# Patient Record
Sex: Male | Born: 1945 | Race: White | Marital: Married | Smoking: Never smoker
Health system: Southern US, Community
[De-identification: ages and names within clinical notes are randomized; demographics above are authoritative.]

## PROBLEM LIST (undated history)

## (undated) DIAGNOSIS — T7840XA Allergy, unspecified, initial encounter: Secondary | ICD-10-CM

## (undated) DIAGNOSIS — Z9989 Dependence on other enabling machines and devices: Secondary | ICD-10-CM

## (undated) DIAGNOSIS — G43909 Migraine, unspecified, not intractable, without status migrainosus: Secondary | ICD-10-CM

## (undated) DIAGNOSIS — J45909 Unspecified asthma, uncomplicated: Secondary | ICD-10-CM

## (undated) DIAGNOSIS — I10 Essential (primary) hypertension: Secondary | ICD-10-CM

## (undated) DIAGNOSIS — J439 Emphysema, unspecified: Secondary | ICD-10-CM

## (undated) DIAGNOSIS — H409 Unspecified glaucoma: Secondary | ICD-10-CM

## (undated) DIAGNOSIS — K5900 Constipation, unspecified: Secondary | ICD-10-CM

## (undated) DIAGNOSIS — M7989 Other specified soft tissue disorders: Secondary | ICD-10-CM

## (undated) DIAGNOSIS — H9190 Unspecified hearing loss, unspecified ear: Secondary | ICD-10-CM

## (undated) DIAGNOSIS — C44309 Unspecified malignant neoplasm of skin of other parts of face: Secondary | ICD-10-CM

## (undated) DIAGNOSIS — J449 Chronic obstructive pulmonary disease, unspecified: Secondary | ICD-10-CM

## (undated) DIAGNOSIS — E669 Obesity, unspecified: Secondary | ICD-10-CM

## (undated) DIAGNOSIS — G4733 Obstructive sleep apnea (adult) (pediatric): Secondary | ICD-10-CM

## (undated) DIAGNOSIS — K219 Gastro-esophageal reflux disease without esophagitis: Secondary | ICD-10-CM

## (undated) HISTORY — DX: Emphysema, unspecified: J43.9

## (undated) HISTORY — DX: Essential (primary) hypertension: I10

## (undated) HISTORY — DX: Migraine, unspecified, not intractable, without status migrainosus: G43.909

## (undated) HISTORY — DX: Unspecified glaucoma: H40.9

## (undated) HISTORY — DX: Gastro-esophageal reflux disease without esophagitis: K21.9

## (undated) HISTORY — PX: CATARACT EXTRACTION: SUR2

## (undated) HISTORY — DX: Obstructive sleep apnea (adult) (pediatric): G47.33

## (undated) HISTORY — PX: MOHS SURGERY: SUR867

## (undated) HISTORY — PX: WISDOM TOOTH EXTRACTION: SHX21

## (undated) HISTORY — DX: Obstructive sleep apnea (adult) (pediatric): Z99.89

## (undated) HISTORY — DX: Constipation, unspecified: K59.00

## (undated) HISTORY — DX: Unspecified asthma, uncomplicated: J45.909

## (undated) HISTORY — DX: Other specified soft tissue disorders: M79.89

## (undated) HISTORY — DX: Allergy, unspecified, initial encounter: T78.40XA

## (undated) HISTORY — DX: Unspecified hearing loss, unspecified ear: H91.90

## (undated) HISTORY — DX: Obesity, unspecified: E66.9

## (undated) HISTORY — DX: Chronic obstructive pulmonary disease, unspecified: J44.9

## (undated) HISTORY — DX: Unspecified malignant neoplasm of skin of other parts of face: C44.309

---

## 2015-03-11 DIAGNOSIS — J3081 Allergic rhinitis due to animal (cat) (dog) hair and dander: Secondary | ICD-10-CM | POA: Diagnosis not present

## 2015-03-11 DIAGNOSIS — J3089 Other allergic rhinitis: Secondary | ICD-10-CM | POA: Diagnosis not present

## 2015-03-11 DIAGNOSIS — J301 Allergic rhinitis due to pollen: Secondary | ICD-10-CM | POA: Diagnosis not present

## 2015-03-20 DIAGNOSIS — J3081 Allergic rhinitis due to animal (cat) (dog) hair and dander: Secondary | ICD-10-CM | POA: Diagnosis not present

## 2015-03-20 DIAGNOSIS — J301 Allergic rhinitis due to pollen: Secondary | ICD-10-CM | POA: Diagnosis not present

## 2015-03-20 DIAGNOSIS — J3089 Other allergic rhinitis: Secondary | ICD-10-CM | POA: Diagnosis not present

## 2015-03-25 DIAGNOSIS — Z0000 Encounter for general adult medical examination without abnormal findings: Secondary | ICD-10-CM | POA: Diagnosis not present

## 2015-03-28 DIAGNOSIS — J301 Allergic rhinitis due to pollen: Secondary | ICD-10-CM | POA: Diagnosis not present

## 2015-03-28 DIAGNOSIS — J3081 Allergic rhinitis due to animal (cat) (dog) hair and dander: Secondary | ICD-10-CM | POA: Diagnosis not present

## 2015-03-28 DIAGNOSIS — J3089 Other allergic rhinitis: Secondary | ICD-10-CM | POA: Diagnosis not present

## 2015-03-31 DIAGNOSIS — J3081 Allergic rhinitis due to animal (cat) (dog) hair and dander: Secondary | ICD-10-CM | POA: Diagnosis not present

## 2015-03-31 DIAGNOSIS — J301 Allergic rhinitis due to pollen: Secondary | ICD-10-CM | POA: Diagnosis not present

## 2015-04-02 DIAGNOSIS — I10 Essential (primary) hypertension: Secondary | ICD-10-CM | POA: Diagnosis not present

## 2015-04-02 DIAGNOSIS — R69 Illness, unspecified: Secondary | ICD-10-CM | POA: Diagnosis not present

## 2015-04-02 DIAGNOSIS — H9190 Unspecified hearing loss, unspecified ear: Secondary | ICD-10-CM | POA: Diagnosis not present

## 2015-04-02 DIAGNOSIS — R7309 Other abnormal glucose: Secondary | ICD-10-CM | POA: Diagnosis not present

## 2015-04-02 DIAGNOSIS — G43909 Migraine, unspecified, not intractable, without status migrainosus: Secondary | ICD-10-CM | POA: Diagnosis not present

## 2015-04-02 DIAGNOSIS — Z6837 Body mass index (BMI) 37.0-37.9, adult: Secondary | ICD-10-CM | POA: Diagnosis not present

## 2015-04-02 DIAGNOSIS — R740 Nonspecific elevation of levels of transaminase and lactic acid dehydrogenase [LDH]: Secondary | ICD-10-CM | POA: Diagnosis not present

## 2015-04-02 DIAGNOSIS — Z0000 Encounter for general adult medical examination without abnormal findings: Secondary | ICD-10-CM | POA: Diagnosis not present

## 2015-04-02 DIAGNOSIS — H409 Unspecified glaucoma: Secondary | ICD-10-CM | POA: Diagnosis not present

## 2015-04-02 DIAGNOSIS — M545 Low back pain: Secondary | ICD-10-CM | POA: Diagnosis not present

## 2015-04-03 DIAGNOSIS — L570 Actinic keratosis: Secondary | ICD-10-CM | POA: Diagnosis not present

## 2015-04-03 DIAGNOSIS — J301 Allergic rhinitis due to pollen: Secondary | ICD-10-CM | POA: Diagnosis not present

## 2015-04-03 DIAGNOSIS — D2371 Other benign neoplasm of skin of right lower limb, including hip: Secondary | ICD-10-CM | POA: Diagnosis not present

## 2015-04-03 DIAGNOSIS — L718 Other rosacea: Secondary | ICD-10-CM | POA: Diagnosis not present

## 2015-04-03 DIAGNOSIS — J3089 Other allergic rhinitis: Secondary | ICD-10-CM | POA: Diagnosis not present

## 2015-04-03 DIAGNOSIS — L821 Other seborrheic keratosis: Secondary | ICD-10-CM | POA: Diagnosis not present

## 2015-04-03 DIAGNOSIS — J3081 Allergic rhinitis due to animal (cat) (dog) hair and dander: Secondary | ICD-10-CM | POA: Diagnosis not present

## 2015-04-04 DIAGNOSIS — J3089 Other allergic rhinitis: Secondary | ICD-10-CM | POA: Diagnosis not present

## 2015-04-11 DIAGNOSIS — J301 Allergic rhinitis due to pollen: Secondary | ICD-10-CM | POA: Diagnosis not present

## 2015-04-11 DIAGNOSIS — J3089 Other allergic rhinitis: Secondary | ICD-10-CM | POA: Diagnosis not present

## 2015-04-16 DIAGNOSIS — J301 Allergic rhinitis due to pollen: Secondary | ICD-10-CM | POA: Diagnosis not present

## 2015-04-16 DIAGNOSIS — J3081 Allergic rhinitis due to animal (cat) (dog) hair and dander: Secondary | ICD-10-CM | POA: Diagnosis not present

## 2015-04-16 DIAGNOSIS — J3089 Other allergic rhinitis: Secondary | ICD-10-CM | POA: Diagnosis not present

## 2015-04-24 DIAGNOSIS — J3089 Other allergic rhinitis: Secondary | ICD-10-CM | POA: Diagnosis not present

## 2015-04-24 DIAGNOSIS — J3081 Allergic rhinitis due to animal (cat) (dog) hair and dander: Secondary | ICD-10-CM | POA: Diagnosis not present

## 2015-04-24 DIAGNOSIS — J301 Allergic rhinitis due to pollen: Secondary | ICD-10-CM | POA: Diagnosis not present

## 2015-05-01 DIAGNOSIS — J3089 Other allergic rhinitis: Secondary | ICD-10-CM | POA: Diagnosis not present

## 2015-05-01 DIAGNOSIS — J301 Allergic rhinitis due to pollen: Secondary | ICD-10-CM | POA: Diagnosis not present

## 2015-05-01 DIAGNOSIS — J3081 Allergic rhinitis due to animal (cat) (dog) hair and dander: Secondary | ICD-10-CM | POA: Diagnosis not present

## 2015-05-06 DIAGNOSIS — J3089 Other allergic rhinitis: Secondary | ICD-10-CM | POA: Diagnosis not present

## 2015-05-06 DIAGNOSIS — J301 Allergic rhinitis due to pollen: Secondary | ICD-10-CM | POA: Diagnosis not present

## 2015-05-06 DIAGNOSIS — J3081 Allergic rhinitis due to animal (cat) (dog) hair and dander: Secondary | ICD-10-CM | POA: Diagnosis not present

## 2015-05-09 DIAGNOSIS — J3089 Other allergic rhinitis: Secondary | ICD-10-CM | POA: Diagnosis not present

## 2015-05-09 DIAGNOSIS — J301 Allergic rhinitis due to pollen: Secondary | ICD-10-CM | POA: Diagnosis not present

## 2015-05-09 DIAGNOSIS — J3081 Allergic rhinitis due to animal (cat) (dog) hair and dander: Secondary | ICD-10-CM | POA: Diagnosis not present

## 2015-05-13 DIAGNOSIS — J3081 Allergic rhinitis due to animal (cat) (dog) hair and dander: Secondary | ICD-10-CM | POA: Diagnosis not present

## 2015-05-13 DIAGNOSIS — J3089 Other allergic rhinitis: Secondary | ICD-10-CM | POA: Diagnosis not present

## 2015-05-13 DIAGNOSIS — J301 Allergic rhinitis due to pollen: Secondary | ICD-10-CM | POA: Diagnosis not present

## 2015-05-15 DIAGNOSIS — H401122 Primary open-angle glaucoma, left eye, moderate stage: Secondary | ICD-10-CM | POA: Diagnosis not present

## 2015-05-15 DIAGNOSIS — H401111 Primary open-angle glaucoma, right eye, mild stage: Secondary | ICD-10-CM | POA: Diagnosis not present

## 2015-05-15 DIAGNOSIS — J3089 Other allergic rhinitis: Secondary | ICD-10-CM | POA: Diagnosis not present

## 2015-05-15 DIAGNOSIS — J3081 Allergic rhinitis due to animal (cat) (dog) hair and dander: Secondary | ICD-10-CM | POA: Diagnosis not present

## 2015-05-15 DIAGNOSIS — J301 Allergic rhinitis due to pollen: Secondary | ICD-10-CM | POA: Diagnosis not present

## 2015-05-22 DIAGNOSIS — J3081 Allergic rhinitis due to animal (cat) (dog) hair and dander: Secondary | ICD-10-CM | POA: Diagnosis not present

## 2015-05-22 DIAGNOSIS — J301 Allergic rhinitis due to pollen: Secondary | ICD-10-CM | POA: Diagnosis not present

## 2015-05-22 DIAGNOSIS — J3089 Other allergic rhinitis: Secondary | ICD-10-CM | POA: Diagnosis not present

## 2015-05-30 DIAGNOSIS — J3089 Other allergic rhinitis: Secondary | ICD-10-CM | POA: Diagnosis not present

## 2015-05-30 DIAGNOSIS — J3081 Allergic rhinitis due to animal (cat) (dog) hair and dander: Secondary | ICD-10-CM | POA: Diagnosis not present

## 2015-05-30 DIAGNOSIS — J301 Allergic rhinitis due to pollen: Secondary | ICD-10-CM | POA: Diagnosis not present

## 2015-06-06 DIAGNOSIS — J3081 Allergic rhinitis due to animal (cat) (dog) hair and dander: Secondary | ICD-10-CM | POA: Diagnosis not present

## 2015-06-06 DIAGNOSIS — J301 Allergic rhinitis due to pollen: Secondary | ICD-10-CM | POA: Diagnosis not present

## 2015-06-06 DIAGNOSIS — J3089 Other allergic rhinitis: Secondary | ICD-10-CM | POA: Diagnosis not present

## 2015-06-11 DIAGNOSIS — J3089 Other allergic rhinitis: Secondary | ICD-10-CM | POA: Diagnosis not present

## 2015-06-11 DIAGNOSIS — J301 Allergic rhinitis due to pollen: Secondary | ICD-10-CM | POA: Diagnosis not present

## 2015-06-11 DIAGNOSIS — J3081 Allergic rhinitis due to animal (cat) (dog) hair and dander: Secondary | ICD-10-CM | POA: Diagnosis not present

## 2015-06-18 DIAGNOSIS — J3081 Allergic rhinitis due to animal (cat) (dog) hair and dander: Secondary | ICD-10-CM | POA: Diagnosis not present

## 2015-06-18 DIAGNOSIS — J3089 Other allergic rhinitis: Secondary | ICD-10-CM | POA: Diagnosis not present

## 2015-06-18 DIAGNOSIS — J301 Allergic rhinitis due to pollen: Secondary | ICD-10-CM | POA: Diagnosis not present

## 2015-06-26 DIAGNOSIS — J4520 Mild intermittent asthma, uncomplicated: Secondary | ICD-10-CM | POA: Diagnosis not present

## 2015-06-26 DIAGNOSIS — J3089 Other allergic rhinitis: Secondary | ICD-10-CM | POA: Diagnosis not present

## 2015-06-26 DIAGNOSIS — J3081 Allergic rhinitis due to animal (cat) (dog) hair and dander: Secondary | ICD-10-CM | POA: Diagnosis not present

## 2015-06-26 DIAGNOSIS — J301 Allergic rhinitis due to pollen: Secondary | ICD-10-CM | POA: Diagnosis not present

## 2015-07-03 DIAGNOSIS — J3089 Other allergic rhinitis: Secondary | ICD-10-CM | POA: Diagnosis not present

## 2015-07-03 DIAGNOSIS — J3081 Allergic rhinitis due to animal (cat) (dog) hair and dander: Secondary | ICD-10-CM | POA: Diagnosis not present

## 2015-07-03 DIAGNOSIS — J301 Allergic rhinitis due to pollen: Secondary | ICD-10-CM | POA: Diagnosis not present

## 2015-07-09 DIAGNOSIS — J301 Allergic rhinitis due to pollen: Secondary | ICD-10-CM | POA: Diagnosis not present

## 2015-07-09 DIAGNOSIS — J3081 Allergic rhinitis due to animal (cat) (dog) hair and dander: Secondary | ICD-10-CM | POA: Diagnosis not present

## 2015-07-09 DIAGNOSIS — J3089 Other allergic rhinitis: Secondary | ICD-10-CM | POA: Diagnosis not present

## 2015-07-16 DIAGNOSIS — J3089 Other allergic rhinitis: Secondary | ICD-10-CM | POA: Diagnosis not present

## 2015-07-16 DIAGNOSIS — J301 Allergic rhinitis due to pollen: Secondary | ICD-10-CM | POA: Diagnosis not present

## 2015-07-16 DIAGNOSIS — J3081 Allergic rhinitis due to animal (cat) (dog) hair and dander: Secondary | ICD-10-CM | POA: Diagnosis not present

## 2015-07-19 DIAGNOSIS — G4733 Obstructive sleep apnea (adult) (pediatric): Secondary | ICD-10-CM | POA: Diagnosis not present

## 2015-07-25 DIAGNOSIS — J3081 Allergic rhinitis due to animal (cat) (dog) hair and dander: Secondary | ICD-10-CM | POA: Diagnosis not present

## 2015-07-25 DIAGNOSIS — J301 Allergic rhinitis due to pollen: Secondary | ICD-10-CM | POA: Diagnosis not present

## 2015-07-25 DIAGNOSIS — J3089 Other allergic rhinitis: Secondary | ICD-10-CM | POA: Diagnosis not present

## 2015-07-31 DIAGNOSIS — J3089 Other allergic rhinitis: Secondary | ICD-10-CM | POA: Diagnosis not present

## 2015-07-31 DIAGNOSIS — J301 Allergic rhinitis due to pollen: Secondary | ICD-10-CM | POA: Diagnosis not present

## 2015-07-31 DIAGNOSIS — J3081 Allergic rhinitis due to animal (cat) (dog) hair and dander: Secondary | ICD-10-CM | POA: Diagnosis not present

## 2015-08-07 DIAGNOSIS — J3081 Allergic rhinitis due to animal (cat) (dog) hair and dander: Secondary | ICD-10-CM | POA: Diagnosis not present

## 2015-08-07 DIAGNOSIS — J3089 Other allergic rhinitis: Secondary | ICD-10-CM | POA: Diagnosis not present

## 2015-08-07 DIAGNOSIS — J301 Allergic rhinitis due to pollen: Secondary | ICD-10-CM | POA: Diagnosis not present

## 2015-08-12 DIAGNOSIS — J3089 Other allergic rhinitis: Secondary | ICD-10-CM | POA: Diagnosis not present

## 2015-08-12 DIAGNOSIS — J3081 Allergic rhinitis due to animal (cat) (dog) hair and dander: Secondary | ICD-10-CM | POA: Diagnosis not present

## 2015-08-12 DIAGNOSIS — J301 Allergic rhinitis due to pollen: Secondary | ICD-10-CM | POA: Diagnosis not present

## 2015-08-19 DIAGNOSIS — H903 Sensorineural hearing loss, bilateral: Secondary | ICD-10-CM | POA: Diagnosis not present

## 2015-08-19 DIAGNOSIS — H9201 Otalgia, right ear: Secondary | ICD-10-CM | POA: Diagnosis not present

## 2015-08-19 DIAGNOSIS — J343 Hypertrophy of nasal turbinates: Secondary | ICD-10-CM | POA: Diagnosis not present

## 2015-08-19 DIAGNOSIS — Z974 Presence of external hearing-aid: Secondary | ICD-10-CM | POA: Diagnosis not present

## 2015-08-19 DIAGNOSIS — H60591 Other noninfective acute otitis externa, right ear: Secondary | ICD-10-CM | POA: Diagnosis not present

## 2015-08-22 DIAGNOSIS — H608X1 Other otitis externa, right ear: Secondary | ICD-10-CM | POA: Diagnosis not present

## 2015-08-22 DIAGNOSIS — H903 Sensorineural hearing loss, bilateral: Secondary | ICD-10-CM | POA: Diagnosis not present

## 2015-08-22 DIAGNOSIS — J3089 Other allergic rhinitis: Secondary | ICD-10-CM | POA: Diagnosis not present

## 2015-08-22 DIAGNOSIS — J301 Allergic rhinitis due to pollen: Secondary | ICD-10-CM | POA: Diagnosis not present

## 2015-08-25 DIAGNOSIS — J301 Allergic rhinitis due to pollen: Secondary | ICD-10-CM | POA: Diagnosis not present

## 2015-08-25 DIAGNOSIS — J3089 Other allergic rhinitis: Secondary | ICD-10-CM | POA: Diagnosis not present

## 2015-08-25 DIAGNOSIS — J3081 Allergic rhinitis due to animal (cat) (dog) hair and dander: Secondary | ICD-10-CM | POA: Diagnosis not present

## 2015-09-04 DIAGNOSIS — J3081 Allergic rhinitis due to animal (cat) (dog) hair and dander: Secondary | ICD-10-CM | POA: Diagnosis not present

## 2015-09-04 DIAGNOSIS — J301 Allergic rhinitis due to pollen: Secondary | ICD-10-CM | POA: Diagnosis not present

## 2015-09-04 DIAGNOSIS — J3089 Other allergic rhinitis: Secondary | ICD-10-CM | POA: Diagnosis not present

## 2015-09-11 DIAGNOSIS — J3081 Allergic rhinitis due to animal (cat) (dog) hair and dander: Secondary | ICD-10-CM | POA: Diagnosis not present

## 2015-09-11 DIAGNOSIS — J3089 Other allergic rhinitis: Secondary | ICD-10-CM | POA: Diagnosis not present

## 2015-09-11 DIAGNOSIS — J301 Allergic rhinitis due to pollen: Secondary | ICD-10-CM | POA: Diagnosis not present

## 2015-09-15 DIAGNOSIS — J3081 Allergic rhinitis due to animal (cat) (dog) hair and dander: Secondary | ICD-10-CM | POA: Diagnosis not present

## 2015-09-15 DIAGNOSIS — J301 Allergic rhinitis due to pollen: Secondary | ICD-10-CM | POA: Diagnosis not present

## 2015-09-15 DIAGNOSIS — J3089 Other allergic rhinitis: Secondary | ICD-10-CM | POA: Diagnosis not present

## 2015-09-24 DIAGNOSIS — J3081 Allergic rhinitis due to animal (cat) (dog) hair and dander: Secondary | ICD-10-CM | POA: Diagnosis not present

## 2015-09-24 DIAGNOSIS — J3089 Other allergic rhinitis: Secondary | ICD-10-CM | POA: Diagnosis not present

## 2015-09-24 DIAGNOSIS — J301 Allergic rhinitis due to pollen: Secondary | ICD-10-CM | POA: Diagnosis not present

## 2015-09-26 DIAGNOSIS — J3089 Other allergic rhinitis: Secondary | ICD-10-CM | POA: Diagnosis not present

## 2015-09-26 DIAGNOSIS — J3081 Allergic rhinitis due to animal (cat) (dog) hair and dander: Secondary | ICD-10-CM | POA: Diagnosis not present

## 2015-09-26 DIAGNOSIS — J301 Allergic rhinitis due to pollen: Secondary | ICD-10-CM | POA: Diagnosis not present

## 2015-09-29 DIAGNOSIS — J301 Allergic rhinitis due to pollen: Secondary | ICD-10-CM | POA: Diagnosis not present

## 2015-09-29 DIAGNOSIS — J3081 Allergic rhinitis due to animal (cat) (dog) hair and dander: Secondary | ICD-10-CM | POA: Diagnosis not present

## 2015-09-29 DIAGNOSIS — J3089 Other allergic rhinitis: Secondary | ICD-10-CM | POA: Diagnosis not present

## 2015-10-01 DIAGNOSIS — J3089 Other allergic rhinitis: Secondary | ICD-10-CM | POA: Diagnosis not present

## 2015-10-01 DIAGNOSIS — J301 Allergic rhinitis due to pollen: Secondary | ICD-10-CM | POA: Diagnosis not present

## 2015-10-01 DIAGNOSIS — J3081 Allergic rhinitis due to animal (cat) (dog) hair and dander: Secondary | ICD-10-CM | POA: Diagnosis not present

## 2015-10-06 DIAGNOSIS — J301 Allergic rhinitis due to pollen: Secondary | ICD-10-CM | POA: Diagnosis not present

## 2015-10-06 DIAGNOSIS — J3089 Other allergic rhinitis: Secondary | ICD-10-CM | POA: Diagnosis not present

## 2015-10-06 DIAGNOSIS — J3081 Allergic rhinitis due to animal (cat) (dog) hair and dander: Secondary | ICD-10-CM | POA: Diagnosis not present

## 2015-10-23 DIAGNOSIS — J3081 Allergic rhinitis due to animal (cat) (dog) hair and dander: Secondary | ICD-10-CM | POA: Diagnosis not present

## 2015-10-23 DIAGNOSIS — J301 Allergic rhinitis due to pollen: Secondary | ICD-10-CM | POA: Diagnosis not present

## 2015-10-23 DIAGNOSIS — J3089 Other allergic rhinitis: Secondary | ICD-10-CM | POA: Diagnosis not present

## 2015-10-27 DIAGNOSIS — J301 Allergic rhinitis due to pollen: Secondary | ICD-10-CM | POA: Diagnosis not present

## 2015-10-27 DIAGNOSIS — J3081 Allergic rhinitis due to animal (cat) (dog) hair and dander: Secondary | ICD-10-CM | POA: Diagnosis not present

## 2015-10-27 DIAGNOSIS — J3089 Other allergic rhinitis: Secondary | ICD-10-CM | POA: Diagnosis not present

## 2015-11-06 DIAGNOSIS — J3081 Allergic rhinitis due to animal (cat) (dog) hair and dander: Secondary | ICD-10-CM | POA: Diagnosis not present

## 2015-11-06 DIAGNOSIS — J3089 Other allergic rhinitis: Secondary | ICD-10-CM | POA: Diagnosis not present

## 2015-11-06 DIAGNOSIS — J301 Allergic rhinitis due to pollen: Secondary | ICD-10-CM | POA: Diagnosis not present

## 2015-11-07 DIAGNOSIS — H40053 Ocular hypertension, bilateral: Secondary | ICD-10-CM | POA: Diagnosis not present

## 2015-11-12 DIAGNOSIS — J3089 Other allergic rhinitis: Secondary | ICD-10-CM | POA: Diagnosis not present

## 2015-11-12 DIAGNOSIS — J3081 Allergic rhinitis due to animal (cat) (dog) hair and dander: Secondary | ICD-10-CM | POA: Diagnosis not present

## 2015-11-12 DIAGNOSIS — J301 Allergic rhinitis due to pollen: Secondary | ICD-10-CM | POA: Diagnosis not present

## 2015-11-20 DIAGNOSIS — J3089 Other allergic rhinitis: Secondary | ICD-10-CM | POA: Diagnosis not present

## 2015-11-20 DIAGNOSIS — J301 Allergic rhinitis due to pollen: Secondary | ICD-10-CM | POA: Diagnosis not present

## 2015-11-28 DIAGNOSIS — J301 Allergic rhinitis due to pollen: Secondary | ICD-10-CM | POA: Diagnosis not present

## 2015-11-28 DIAGNOSIS — J3081 Allergic rhinitis due to animal (cat) (dog) hair and dander: Secondary | ICD-10-CM | POA: Diagnosis not present

## 2015-11-28 DIAGNOSIS — J3089 Other allergic rhinitis: Secondary | ICD-10-CM | POA: Diagnosis not present

## 2015-12-05 DIAGNOSIS — J301 Allergic rhinitis due to pollen: Secondary | ICD-10-CM | POA: Diagnosis not present

## 2015-12-05 DIAGNOSIS — J3089 Other allergic rhinitis: Secondary | ICD-10-CM | POA: Diagnosis not present

## 2015-12-05 DIAGNOSIS — J3081 Allergic rhinitis due to animal (cat) (dog) hair and dander: Secondary | ICD-10-CM | POA: Diagnosis not present

## 2015-12-12 DIAGNOSIS — J3081 Allergic rhinitis due to animal (cat) (dog) hair and dander: Secondary | ICD-10-CM | POA: Diagnosis not present

## 2015-12-12 DIAGNOSIS — J3089 Other allergic rhinitis: Secondary | ICD-10-CM | POA: Diagnosis not present

## 2015-12-12 DIAGNOSIS — J301 Allergic rhinitis due to pollen: Secondary | ICD-10-CM | POA: Diagnosis not present

## 2015-12-16 DIAGNOSIS — J3081 Allergic rhinitis due to animal (cat) (dog) hair and dander: Secondary | ICD-10-CM | POA: Diagnosis not present

## 2015-12-16 DIAGNOSIS — J301 Allergic rhinitis due to pollen: Secondary | ICD-10-CM | POA: Diagnosis not present

## 2015-12-16 DIAGNOSIS — J3089 Other allergic rhinitis: Secondary | ICD-10-CM | POA: Diagnosis not present

## 2015-12-26 DIAGNOSIS — J3081 Allergic rhinitis due to animal (cat) (dog) hair and dander: Secondary | ICD-10-CM | POA: Diagnosis not present

## 2015-12-26 DIAGNOSIS — J301 Allergic rhinitis due to pollen: Secondary | ICD-10-CM | POA: Diagnosis not present

## 2015-12-26 DIAGNOSIS — J3089 Other allergic rhinitis: Secondary | ICD-10-CM | POA: Diagnosis not present

## 2016-01-01 DIAGNOSIS — J3081 Allergic rhinitis due to animal (cat) (dog) hair and dander: Secondary | ICD-10-CM | POA: Diagnosis not present

## 2016-01-01 DIAGNOSIS — J3089 Other allergic rhinitis: Secondary | ICD-10-CM | POA: Diagnosis not present

## 2016-01-01 DIAGNOSIS — J301 Allergic rhinitis due to pollen: Secondary | ICD-10-CM | POA: Diagnosis not present

## 2016-01-09 DIAGNOSIS — J3089 Other allergic rhinitis: Secondary | ICD-10-CM | POA: Diagnosis not present

## 2016-01-09 DIAGNOSIS — J301 Allergic rhinitis due to pollen: Secondary | ICD-10-CM | POA: Diagnosis not present

## 2016-01-09 DIAGNOSIS — J3081 Allergic rhinitis due to animal (cat) (dog) hair and dander: Secondary | ICD-10-CM | POA: Diagnosis not present

## 2016-01-14 DIAGNOSIS — J3089 Other allergic rhinitis: Secondary | ICD-10-CM | POA: Diagnosis not present

## 2016-01-14 DIAGNOSIS — J301 Allergic rhinitis due to pollen: Secondary | ICD-10-CM | POA: Diagnosis not present

## 2016-01-14 DIAGNOSIS — J3081 Allergic rhinitis due to animal (cat) (dog) hair and dander: Secondary | ICD-10-CM | POA: Diagnosis not present

## 2016-01-19 DIAGNOSIS — J301 Allergic rhinitis due to pollen: Secondary | ICD-10-CM | POA: Diagnosis not present

## 2016-01-19 DIAGNOSIS — J3089 Other allergic rhinitis: Secondary | ICD-10-CM | POA: Diagnosis not present

## 2016-01-19 DIAGNOSIS — G4733 Obstructive sleep apnea (adult) (pediatric): Secondary | ICD-10-CM | POA: Diagnosis not present

## 2016-01-19 DIAGNOSIS — J3081 Allergic rhinitis due to animal (cat) (dog) hair and dander: Secondary | ICD-10-CM | POA: Diagnosis not present

## 2016-01-22 DIAGNOSIS — J3081 Allergic rhinitis due to animal (cat) (dog) hair and dander: Secondary | ICD-10-CM | POA: Diagnosis not present

## 2016-01-22 DIAGNOSIS — J3089 Other allergic rhinitis: Secondary | ICD-10-CM | POA: Diagnosis not present

## 2016-01-22 DIAGNOSIS — J301 Allergic rhinitis due to pollen: Secondary | ICD-10-CM | POA: Diagnosis not present

## 2016-02-04 DIAGNOSIS — J301 Allergic rhinitis due to pollen: Secondary | ICD-10-CM | POA: Diagnosis not present

## 2016-02-04 DIAGNOSIS — J3081 Allergic rhinitis due to animal (cat) (dog) hair and dander: Secondary | ICD-10-CM | POA: Diagnosis not present

## 2016-02-04 DIAGNOSIS — J3089 Other allergic rhinitis: Secondary | ICD-10-CM | POA: Diagnosis not present

## 2016-02-13 DIAGNOSIS — J301 Allergic rhinitis due to pollen: Secondary | ICD-10-CM | POA: Diagnosis not present

## 2016-02-13 DIAGNOSIS — J3081 Allergic rhinitis due to animal (cat) (dog) hair and dander: Secondary | ICD-10-CM | POA: Diagnosis not present

## 2016-02-13 DIAGNOSIS — J3089 Other allergic rhinitis: Secondary | ICD-10-CM | POA: Diagnosis not present

## 2016-02-23 DIAGNOSIS — Z0000 Encounter for general adult medical examination without abnormal findings: Secondary | ICD-10-CM | POA: Diagnosis not present

## 2016-02-23 DIAGNOSIS — K219 Gastro-esophageal reflux disease without esophagitis: Secondary | ICD-10-CM | POA: Diagnosis not present

## 2016-02-23 DIAGNOSIS — R69 Illness, unspecified: Secondary | ICD-10-CM | POA: Diagnosis not present

## 2016-02-23 DIAGNOSIS — G43909 Migraine, unspecified, not intractable, without status migrainosus: Secondary | ICD-10-CM | POA: Diagnosis not present

## 2016-02-23 DIAGNOSIS — Z6835 Body mass index (BMI) 35.0-35.9, adult: Secondary | ICD-10-CM | POA: Diagnosis not present

## 2016-02-23 DIAGNOSIS — J449 Chronic obstructive pulmonary disease, unspecified: Secondary | ICD-10-CM | POA: Diagnosis not present

## 2016-02-24 DIAGNOSIS — J3081 Allergic rhinitis due to animal (cat) (dog) hair and dander: Secondary | ICD-10-CM | POA: Diagnosis not present

## 2016-02-24 DIAGNOSIS — J301 Allergic rhinitis due to pollen: Secondary | ICD-10-CM | POA: Diagnosis not present

## 2016-02-24 DIAGNOSIS — J3089 Other allergic rhinitis: Secondary | ICD-10-CM | POA: Diagnosis not present

## 2016-03-04 DIAGNOSIS — J3089 Other allergic rhinitis: Secondary | ICD-10-CM | POA: Diagnosis not present

## 2016-03-04 DIAGNOSIS — J301 Allergic rhinitis due to pollen: Secondary | ICD-10-CM | POA: Diagnosis not present

## 2016-03-04 DIAGNOSIS — J3081 Allergic rhinitis due to animal (cat) (dog) hair and dander: Secondary | ICD-10-CM | POA: Diagnosis not present

## 2016-03-10 DIAGNOSIS — J3089 Other allergic rhinitis: Secondary | ICD-10-CM | POA: Diagnosis not present

## 2016-03-10 DIAGNOSIS — J301 Allergic rhinitis due to pollen: Secondary | ICD-10-CM | POA: Diagnosis not present

## 2016-03-10 DIAGNOSIS — J3081 Allergic rhinitis due to animal (cat) (dog) hair and dander: Secondary | ICD-10-CM | POA: Diagnosis not present

## 2016-03-12 DIAGNOSIS — J3089 Other allergic rhinitis: Secondary | ICD-10-CM | POA: Diagnosis not present

## 2016-03-12 DIAGNOSIS — J301 Allergic rhinitis due to pollen: Secondary | ICD-10-CM | POA: Diagnosis not present

## 2016-03-12 DIAGNOSIS — J3081 Allergic rhinitis due to animal (cat) (dog) hair and dander: Secondary | ICD-10-CM | POA: Diagnosis not present

## 2016-03-18 DIAGNOSIS — J3089 Other allergic rhinitis: Secondary | ICD-10-CM | POA: Diagnosis not present

## 2016-03-18 DIAGNOSIS — J301 Allergic rhinitis due to pollen: Secondary | ICD-10-CM | POA: Diagnosis not present

## 2016-03-18 DIAGNOSIS — J3081 Allergic rhinitis due to animal (cat) (dog) hair and dander: Secondary | ICD-10-CM | POA: Diagnosis not present

## 2016-03-23 DIAGNOSIS — J3081 Allergic rhinitis due to animal (cat) (dog) hair and dander: Secondary | ICD-10-CM | POA: Diagnosis not present

## 2016-03-23 DIAGNOSIS — J301 Allergic rhinitis due to pollen: Secondary | ICD-10-CM | POA: Diagnosis not present

## 2016-03-23 DIAGNOSIS — J3089 Other allergic rhinitis: Secondary | ICD-10-CM | POA: Diagnosis not present

## 2016-03-30 DIAGNOSIS — R8299 Other abnormal findings in urine: Secondary | ICD-10-CM | POA: Diagnosis not present

## 2016-03-30 DIAGNOSIS — I10 Essential (primary) hypertension: Secondary | ICD-10-CM | POA: Diagnosis not present

## 2016-03-30 DIAGNOSIS — Z125 Encounter for screening for malignant neoplasm of prostate: Secondary | ICD-10-CM | POA: Diagnosis not present

## 2016-03-30 DIAGNOSIS — R7309 Other abnormal glucose: Secondary | ICD-10-CM | POA: Diagnosis not present

## 2016-04-01 DIAGNOSIS — J3089 Other allergic rhinitis: Secondary | ICD-10-CM | POA: Diagnosis not present

## 2016-04-01 DIAGNOSIS — J3081 Allergic rhinitis due to animal (cat) (dog) hair and dander: Secondary | ICD-10-CM | POA: Diagnosis not present

## 2016-04-01 DIAGNOSIS — J301 Allergic rhinitis due to pollen: Secondary | ICD-10-CM | POA: Diagnosis not present

## 2016-04-06 DIAGNOSIS — I10 Essential (primary) hypertension: Secondary | ICD-10-CM | POA: Diagnosis not present

## 2016-04-06 DIAGNOSIS — J45909 Unspecified asthma, uncomplicated: Secondary | ICD-10-CM | POA: Diagnosis not present

## 2016-04-06 DIAGNOSIS — K219 Gastro-esophageal reflux disease without esophagitis: Secondary | ICD-10-CM | POA: Diagnosis not present

## 2016-04-06 DIAGNOSIS — Z6835 Body mass index (BMI) 35.0-35.9, adult: Secondary | ICD-10-CM | POA: Diagnosis not present

## 2016-04-06 DIAGNOSIS — R7309 Other abnormal glucose: Secondary | ICD-10-CM | POA: Diagnosis not present

## 2016-04-06 DIAGNOSIS — G43909 Migraine, unspecified, not intractable, without status migrainosus: Secondary | ICD-10-CM | POA: Diagnosis not present

## 2016-04-06 DIAGNOSIS — R69 Illness, unspecified: Secondary | ICD-10-CM | POA: Diagnosis not present

## 2016-04-06 DIAGNOSIS — H409 Unspecified glaucoma: Secondary | ICD-10-CM | POA: Diagnosis not present

## 2016-04-06 DIAGNOSIS — Z0000 Encounter for general adult medical examination without abnormal findings: Secondary | ICD-10-CM | POA: Diagnosis not present

## 2016-04-06 DIAGNOSIS — G4733 Obstructive sleep apnea (adult) (pediatric): Secondary | ICD-10-CM | POA: Diagnosis not present

## 2016-04-08 ENCOUNTER — Encounter: Payer: Self-pay | Admitting: Internal Medicine

## 2016-04-09 DIAGNOSIS — J3081 Allergic rhinitis due to animal (cat) (dog) hair and dander: Secondary | ICD-10-CM | POA: Diagnosis not present

## 2016-04-09 DIAGNOSIS — J3089 Other allergic rhinitis: Secondary | ICD-10-CM | POA: Diagnosis not present

## 2016-04-09 DIAGNOSIS — J301 Allergic rhinitis due to pollen: Secondary | ICD-10-CM | POA: Diagnosis not present

## 2016-04-14 DIAGNOSIS — Z1212 Encounter for screening for malignant neoplasm of rectum: Secondary | ICD-10-CM | POA: Diagnosis not present

## 2016-04-14 DIAGNOSIS — J301 Allergic rhinitis due to pollen: Secondary | ICD-10-CM | POA: Diagnosis not present

## 2016-04-14 DIAGNOSIS — J3081 Allergic rhinitis due to animal (cat) (dog) hair and dander: Secondary | ICD-10-CM | POA: Diagnosis not present

## 2016-04-14 DIAGNOSIS — J3089 Other allergic rhinitis: Secondary | ICD-10-CM | POA: Diagnosis not present

## 2016-04-21 DIAGNOSIS — J3081 Allergic rhinitis due to animal (cat) (dog) hair and dander: Secondary | ICD-10-CM | POA: Diagnosis not present

## 2016-04-21 DIAGNOSIS — J301 Allergic rhinitis due to pollen: Secondary | ICD-10-CM | POA: Diagnosis not present

## 2016-04-21 DIAGNOSIS — J3089 Other allergic rhinitis: Secondary | ICD-10-CM | POA: Diagnosis not present

## 2016-04-30 DIAGNOSIS — J301 Allergic rhinitis due to pollen: Secondary | ICD-10-CM | POA: Diagnosis not present

## 2016-04-30 DIAGNOSIS — J3081 Allergic rhinitis due to animal (cat) (dog) hair and dander: Secondary | ICD-10-CM | POA: Diagnosis not present

## 2016-04-30 DIAGNOSIS — J3089 Other allergic rhinitis: Secondary | ICD-10-CM | POA: Diagnosis not present

## 2016-05-06 DIAGNOSIS — H43811 Vitreous degeneration, right eye: Secondary | ICD-10-CM | POA: Diagnosis not present

## 2016-05-06 DIAGNOSIS — H2513 Age-related nuclear cataract, bilateral: Secondary | ICD-10-CM | POA: Diagnosis not present

## 2016-05-06 DIAGNOSIS — H40053 Ocular hypertension, bilateral: Secondary | ICD-10-CM | POA: Diagnosis not present

## 2016-05-07 DIAGNOSIS — J301 Allergic rhinitis due to pollen: Secondary | ICD-10-CM | POA: Diagnosis not present

## 2016-05-07 DIAGNOSIS — J3089 Other allergic rhinitis: Secondary | ICD-10-CM | POA: Diagnosis not present

## 2016-05-07 DIAGNOSIS — J3081 Allergic rhinitis due to animal (cat) (dog) hair and dander: Secondary | ICD-10-CM | POA: Diagnosis not present

## 2016-05-12 DIAGNOSIS — J301 Allergic rhinitis due to pollen: Secondary | ICD-10-CM | POA: Diagnosis not present

## 2016-05-12 DIAGNOSIS — J3089 Other allergic rhinitis: Secondary | ICD-10-CM | POA: Diagnosis not present

## 2016-05-12 DIAGNOSIS — J3081 Allergic rhinitis due to animal (cat) (dog) hair and dander: Secondary | ICD-10-CM | POA: Diagnosis not present

## 2016-05-18 ENCOUNTER — Ambulatory Visit (AMBULATORY_SURGERY_CENTER): Payer: Self-pay

## 2016-05-18 VITALS — Ht 69.0 in | Wt 246.8 lb

## 2016-05-18 DIAGNOSIS — Z1211 Encounter for screening for malignant neoplasm of colon: Secondary | ICD-10-CM

## 2016-05-18 MED ORDER — NA SULFATE-K SULFATE-MG SULF 17.5-3.13-1.6 GM/177ML PO SOLN: ORAL | 0 refills | Status: DC

## 2016-05-18 NOTE — Progress Notes (Signed)
Per pt, no allergies to soy or egg products.Pt not taking any weight loss meds or using  O2 at home. 

## 2016-05-19 ENCOUNTER — Encounter: Payer: Self-pay | Admitting: Internal Medicine

## 2016-05-21 DIAGNOSIS — J3081 Allergic rhinitis due to animal (cat) (dog) hair and dander: Secondary | ICD-10-CM | POA: Diagnosis not present

## 2016-05-21 DIAGNOSIS — J301 Allergic rhinitis due to pollen: Secondary | ICD-10-CM | POA: Diagnosis not present

## 2016-05-21 DIAGNOSIS — J3089 Other allergic rhinitis: Secondary | ICD-10-CM | POA: Diagnosis not present

## 2016-05-28 DIAGNOSIS — J3089 Other allergic rhinitis: Secondary | ICD-10-CM | POA: Diagnosis not present

## 2016-05-28 DIAGNOSIS — J301 Allergic rhinitis due to pollen: Secondary | ICD-10-CM | POA: Diagnosis not present

## 2016-05-28 DIAGNOSIS — J3081 Allergic rhinitis due to animal (cat) (dog) hair and dander: Secondary | ICD-10-CM | POA: Diagnosis not present

## 2016-06-01 ENCOUNTER — Encounter: Payer: Self-pay | Admitting: Internal Medicine

## 2016-06-01 ENCOUNTER — Ambulatory Visit (AMBULATORY_SURGERY_CENTER): Payer: Medicare HMO | Admitting: Internal Medicine

## 2016-06-01 VITALS — BP 116/81 | HR 60 | Temp 96.9°F | Resp 15 | Ht 69.0 in | Wt 246.0 lb

## 2016-06-01 DIAGNOSIS — Z1212 Encounter for screening for malignant neoplasm of rectum: Secondary | ICD-10-CM

## 2016-06-01 DIAGNOSIS — Z1211 Encounter for screening for malignant neoplasm of colon: Secondary | ICD-10-CM

## 2016-06-01 DIAGNOSIS — D129 Benign neoplasm of anus and anal canal: Secondary | ICD-10-CM

## 2016-06-01 DIAGNOSIS — D128 Benign neoplasm of rectum: Secondary | ICD-10-CM | POA: Diagnosis not present

## 2016-06-01 DIAGNOSIS — I10 Essential (primary) hypertension: Secondary | ICD-10-CM | POA: Diagnosis not present

## 2016-06-01 MED ORDER — SODIUM CHLORIDE 0.9 % IV SOLN: 500.0000 mL | INTRAVENOUS | Status: DC

## 2016-06-01 NOTE — Op Note (Signed)
[facility] Patient Name: Stephen Oconnell Procedure Date: 06/01/2016 7:55 AM MRN: [id number] Endoscopist: Jerene Bears , MD Age: 71 Referring MD:  Date of Birth: 1945-03-21 Gender: Male Account #: [id number] Procedure:                Colonoscopy Indications:              Screening for colorectal malignant neoplasm, Last                            colonoscopy 10 years ago Medicines:                Monitored Anesthesia Care Procedure:                Pre-Anesthesia Assessment:                           - Prior to the procedure, a History and Physical                            was performed, and patient medications and                            allergies were reviewed. The patient's tolerance of                            previous anesthesia was also reviewed. The risks                            and benefits of the procedure and the sedation                            options and risks were discussed with the patient.                            All questions were answered, and informed consent                            was obtained. Prior Anticoagulants: The patient has                            taken no previous anticoagulant or antiplatelet                            agents. ASA Grade Assessment: II - A patient with                            mild systemic disease. After reviewing the risks                            and benefits, the patient was deemed in                            satisfactory condition to undergo the procedure.  After obtaining informed consent, the colonoscope                            was passed under direct vision. Throughout the                            procedure, the patient's blood pressure, pulse, and                            oxygen saturations were monitored continuously. The                            Colonoscope was introduced through the anus and                            advanced to the the cecum, identified by                             appendiceal orifice and ileocecal valve. The                            colonoscopy was performed without difficulty. The                            patient tolerated the procedure well. The quality                            of the bowel preparation was good. The ileocecal                            valve, appendiceal orifice, and rectum were                            photographed. Scope In: 8:11:38 AM Scope Out: 8:28:56 AM Scope Withdrawal Time: 0 hours 13 minutes 32 seconds  Total Procedure Duration: 0 hours 17 minutes 18 seconds  Findings:                 The digital rectal exam was normal.                           A 5 mm polyp was found in the rectum. The polyp was                            sessile. The polyp was removed with a cold snare.                            Resection and retrieval were complete.                           Multiple small and large-mouthed diverticula were                            found in the sigmoid colon.  Internal hemorrhoids were found during                            retroflexion. The hemorrhoids were small. Complications:            No immediate complications. Estimated Blood Loss:     Estimated blood loss was minimal. Impression:               - One 5 mm polyp in the rectum, removed with a cold                            snare. Resected and retrieved.                           - Mild diverticulosis in the sigmoid colon.                           - Internal hemorrhoids. Recommendation:           - Patient has a contact number available for                            emergencies. The signs and symptoms of potential                            delayed complications were discussed with the                            patient. Return to normal activities tomorrow.                            Written discharge instructions were provided to the                            patient.                            - Resume previous diet.                           - Continue present medications.                           - Await pathology results.                           - Repeat colonoscopy is recommended. The                            colonoscopy date will be determined after pathology                            results from today's exam become available for                            review. Jerene Bears, MD 06/01/2016 8:32:12 AM This report has been signed electronically.

## 2016-06-01 NOTE — Patient Instructions (Signed)
Handouts given on polyps, diverticulosis and hemorrhoids   YOU HAD AN ENDOSCOPIC PROCEDURE TODAY: Refer to the procedure report and other information in the discharge instructions given to you for any specific questions about what was found during the examination. If this information does not answer your questions, please call [facility] office at [phone] to clarify.   YOU SHOULD EXPECT: Some feelings of bloating in the abdomen. Passage of more gas than usual. Walking can help get rid of the air that was put into your GI tract during the procedure and reduce the bloating. If you had a lower endoscopy (such as a colonoscopy or flexible sigmoidoscopy) you may notice spotting of blood in your stool or on the toilet paper. Some abdominal soreness may be present for a day or two, also.  DIET: Your first meal following the procedure should be a light meal and then it is ok to progress to your normal diet. A half-sandwich or bowl of soup is an example of a good first meal. Heavy or fried foods are harder to digest and may make you feel nauseous or bloated. Drink plenty of fluids but you should avoid alcoholic beverages for 24 hours. If you had a esophageal dilation, please see attached instructions for diet.    ACTIVITY: Your care partner should take you home directly after the procedure. You should plan to take it easy, moving slowly for the rest of the day. You can resume normal activity the day after the procedure however YOU SHOULD NOT DRIVE, use power tools, machinery or perform tasks that involve climbing or major physical exertion for 24 hours (because of the sedation medicines used during the test).   SYMPTOMS TO REPORT IMMEDIATELY: A gastroenterologist can be reached at any hour. Please call [phone]  for any of the following symptoms:  Following lower endoscopy (colonoscopy, flexible sigmoidoscopy) Excessive amounts of blood in the stool  Significant tenderness, worsening of abdominal pains   Swelling of the abdomen that is new, acute  Fever of 100 or higher    FOLLOW UP:  If any biopsies were taken you will be contacted by phone or by letter within the next 1-3 weeks. Call [phone]  if you have not heard about the biopsies in 3 weeks.  Please also call with any specific questions about appointments or follow up tests.  

## 2016-06-01 NOTE — Progress Notes (Signed)
Pt's states no medical or surgical changes since previsit or office visit. 

## 2016-06-01 NOTE — Progress Notes (Signed)
Called to room to assist during endoscopic procedure.  Patient ID and intended procedure confirmed with present staff. Received instructions for my participation in the procedure from the performing physician.  

## 2016-06-01 NOTE — Progress Notes (Signed)
Patient awakening,vss,report to rn 

## 2016-06-02 ENCOUNTER — Telehealth: Payer: Self-pay

## 2016-06-02 NOTE — Telephone Encounter (Signed)
  Follow up Call-  Call back number 06/01/2016  Post procedure Call Back phone  # [phone]  Permission to leave phone message Yes    Patient was called for follow up after his procedure on 06/01/16. No answer at the number given for follow up phone call. A message was left on the answering machine.

## 2016-06-02 NOTE — Telephone Encounter (Signed)
  Follow up Call-  Call back number 06/01/2016  Post procedure Call Back phone  # [phone]  Permission to leave phone message Yes     Left message

## 2016-06-03 DIAGNOSIS — J3089 Other allergic rhinitis: Secondary | ICD-10-CM | POA: Diagnosis not present

## 2016-06-03 DIAGNOSIS — J301 Allergic rhinitis due to pollen: Secondary | ICD-10-CM | POA: Diagnosis not present

## 2016-06-07 ENCOUNTER — Encounter: Payer: Self-pay | Admitting: Internal Medicine

## 2016-06-08 DIAGNOSIS — H43811 Vitreous degeneration, right eye: Secondary | ICD-10-CM | POA: Diagnosis not present

## 2016-06-09 DIAGNOSIS — J3089 Other allergic rhinitis: Secondary | ICD-10-CM | POA: Diagnosis not present

## 2016-06-09 DIAGNOSIS — J301 Allergic rhinitis due to pollen: Secondary | ICD-10-CM | POA: Diagnosis not present

## 2016-06-09 DIAGNOSIS — J3081 Allergic rhinitis due to animal (cat) (dog) hair and dander: Secondary | ICD-10-CM | POA: Diagnosis not present

## 2016-06-15 DIAGNOSIS — J4520 Mild intermittent asthma, uncomplicated: Secondary | ICD-10-CM | POA: Diagnosis not present

## 2016-06-15 DIAGNOSIS — J3081 Allergic rhinitis due to animal (cat) (dog) hair and dander: Secondary | ICD-10-CM | POA: Diagnosis not present

## 2016-06-15 DIAGNOSIS — J301 Allergic rhinitis due to pollen: Secondary | ICD-10-CM | POA: Diagnosis not present

## 2016-06-15 DIAGNOSIS — J3089 Other allergic rhinitis: Secondary | ICD-10-CM | POA: Diagnosis not present

## 2016-06-21 DIAGNOSIS — J301 Allergic rhinitis due to pollen: Secondary | ICD-10-CM | POA: Diagnosis not present

## 2016-06-21 DIAGNOSIS — J3081 Allergic rhinitis due to animal (cat) (dog) hair and dander: Secondary | ICD-10-CM | POA: Diagnosis not present

## 2016-06-21 DIAGNOSIS — J3089 Other allergic rhinitis: Secondary | ICD-10-CM | POA: Diagnosis not present

## 2016-06-30 DIAGNOSIS — J301 Allergic rhinitis due to pollen: Secondary | ICD-10-CM | POA: Diagnosis not present

## 2016-06-30 DIAGNOSIS — J3081 Allergic rhinitis due to animal (cat) (dog) hair and dander: Secondary | ICD-10-CM | POA: Diagnosis not present

## 2016-06-30 DIAGNOSIS — J3089 Other allergic rhinitis: Secondary | ICD-10-CM | POA: Diagnosis not present

## 2016-07-13 DIAGNOSIS — J301 Allergic rhinitis due to pollen: Secondary | ICD-10-CM | POA: Diagnosis not present

## 2016-07-13 DIAGNOSIS — J3081 Allergic rhinitis due to animal (cat) (dog) hair and dander: Secondary | ICD-10-CM | POA: Diagnosis not present

## 2016-07-13 DIAGNOSIS — J3089 Other allergic rhinitis: Secondary | ICD-10-CM | POA: Diagnosis not present

## 2016-07-19 DIAGNOSIS — A084 Viral intestinal infection, unspecified: Secondary | ICD-10-CM | POA: Diagnosis not present

## 2016-07-19 DIAGNOSIS — J45909 Unspecified asthma, uncomplicated: Secondary | ICD-10-CM | POA: Diagnosis not present

## 2016-07-19 DIAGNOSIS — R5383 Other fatigue: Secondary | ICD-10-CM | POA: Diagnosis not present

## 2016-07-19 DIAGNOSIS — Z6836 Body mass index (BMI) 36.0-36.9, adult: Secondary | ICD-10-CM | POA: Diagnosis not present

## 2016-08-04 DIAGNOSIS — L0390 Cellulitis, unspecified: Secondary | ICD-10-CM | POA: Diagnosis not present

## 2016-08-04 DIAGNOSIS — T63451A Toxic effect of venom of hornets, accidental (unintentional), initial encounter: Secondary | ICD-10-CM | POA: Diagnosis not present

## 2016-08-10 DIAGNOSIS — J3081 Allergic rhinitis due to animal (cat) (dog) hair and dander: Secondary | ICD-10-CM | POA: Diagnosis not present

## 2016-08-10 DIAGNOSIS — J3089 Other allergic rhinitis: Secondary | ICD-10-CM | POA: Diagnosis not present

## 2016-08-10 DIAGNOSIS — J301 Allergic rhinitis due to pollen: Secondary | ICD-10-CM | POA: Diagnosis not present

## 2016-08-16 DIAGNOSIS — J3089 Other allergic rhinitis: Secondary | ICD-10-CM | POA: Diagnosis not present

## 2016-08-16 DIAGNOSIS — J301 Allergic rhinitis due to pollen: Secondary | ICD-10-CM | POA: Diagnosis not present

## 2016-08-16 DIAGNOSIS — J3081 Allergic rhinitis due to animal (cat) (dog) hair and dander: Secondary | ICD-10-CM | POA: Diagnosis not present

## 2016-08-18 DIAGNOSIS — J3089 Other allergic rhinitis: Secondary | ICD-10-CM | POA: Diagnosis not present

## 2016-08-18 DIAGNOSIS — J3081 Allergic rhinitis due to animal (cat) (dog) hair and dander: Secondary | ICD-10-CM | POA: Diagnosis not present

## 2016-08-18 DIAGNOSIS — G4733 Obstructive sleep apnea (adult) (pediatric): Secondary | ICD-10-CM | POA: Diagnosis not present

## 2016-08-18 DIAGNOSIS — J301 Allergic rhinitis due to pollen: Secondary | ICD-10-CM | POA: Diagnosis not present

## 2016-08-30 DIAGNOSIS — J301 Allergic rhinitis due to pollen: Secondary | ICD-10-CM | POA: Diagnosis not present

## 2016-08-30 DIAGNOSIS — J3089 Other allergic rhinitis: Secondary | ICD-10-CM | POA: Diagnosis not present

## 2016-08-30 DIAGNOSIS — J3081 Allergic rhinitis due to animal (cat) (dog) hair and dander: Secondary | ICD-10-CM | POA: Diagnosis not present

## 2016-09-02 DIAGNOSIS — G4733 Obstructive sleep apnea (adult) (pediatric): Secondary | ICD-10-CM | POA: Diagnosis not present

## 2016-09-07 DIAGNOSIS — J3081 Allergic rhinitis due to animal (cat) (dog) hair and dander: Secondary | ICD-10-CM | POA: Diagnosis not present

## 2016-09-07 DIAGNOSIS — J3089 Other allergic rhinitis: Secondary | ICD-10-CM | POA: Diagnosis not present

## 2016-09-07 DIAGNOSIS — J301 Allergic rhinitis due to pollen: Secondary | ICD-10-CM | POA: Diagnosis not present

## 2016-09-09 DIAGNOSIS — J3089 Other allergic rhinitis: Secondary | ICD-10-CM | POA: Diagnosis not present

## 2016-09-09 DIAGNOSIS — J301 Allergic rhinitis due to pollen: Secondary | ICD-10-CM | POA: Diagnosis not present

## 2016-09-09 DIAGNOSIS — J3081 Allergic rhinitis due to animal (cat) (dog) hair and dander: Secondary | ICD-10-CM | POA: Diagnosis not present

## 2016-09-13 DIAGNOSIS — J3081 Allergic rhinitis due to animal (cat) (dog) hair and dander: Secondary | ICD-10-CM | POA: Diagnosis not present

## 2016-09-13 DIAGNOSIS — J301 Allergic rhinitis due to pollen: Secondary | ICD-10-CM | POA: Diagnosis not present

## 2016-09-13 DIAGNOSIS — J3089 Other allergic rhinitis: Secondary | ICD-10-CM | POA: Diagnosis not present

## 2016-09-17 DIAGNOSIS — J3081 Allergic rhinitis due to animal (cat) (dog) hair and dander: Secondary | ICD-10-CM | POA: Diagnosis not present

## 2016-09-17 DIAGNOSIS — G4733 Obstructive sleep apnea (adult) (pediatric): Secondary | ICD-10-CM | POA: Diagnosis not present

## 2016-09-17 DIAGNOSIS — J301 Allergic rhinitis due to pollen: Secondary | ICD-10-CM | POA: Diagnosis not present

## 2016-09-17 DIAGNOSIS — J3089 Other allergic rhinitis: Secondary | ICD-10-CM | POA: Diagnosis not present

## 2016-09-20 DIAGNOSIS — J3081 Allergic rhinitis due to animal (cat) (dog) hair and dander: Secondary | ICD-10-CM | POA: Diagnosis not present

## 2016-09-20 DIAGNOSIS — J3089 Other allergic rhinitis: Secondary | ICD-10-CM | POA: Diagnosis not present

## 2016-09-20 DIAGNOSIS — J301 Allergic rhinitis due to pollen: Secondary | ICD-10-CM | POA: Diagnosis not present

## 2016-09-24 DIAGNOSIS — J3089 Other allergic rhinitis: Secondary | ICD-10-CM | POA: Diagnosis not present

## 2016-09-24 DIAGNOSIS — J301 Allergic rhinitis due to pollen: Secondary | ICD-10-CM | POA: Diagnosis not present

## 2016-09-24 DIAGNOSIS — J3081 Allergic rhinitis due to animal (cat) (dog) hair and dander: Secondary | ICD-10-CM | POA: Diagnosis not present

## 2016-09-29 DIAGNOSIS — J3081 Allergic rhinitis due to animal (cat) (dog) hair and dander: Secondary | ICD-10-CM | POA: Diagnosis not present

## 2016-09-29 DIAGNOSIS — J301 Allergic rhinitis due to pollen: Secondary | ICD-10-CM | POA: Diagnosis not present

## 2016-09-29 DIAGNOSIS — J3089 Other allergic rhinitis: Secondary | ICD-10-CM | POA: Diagnosis not present

## 2016-10-06 DIAGNOSIS — J301 Allergic rhinitis due to pollen: Secondary | ICD-10-CM | POA: Diagnosis not present

## 2016-10-06 DIAGNOSIS — J3081 Allergic rhinitis due to animal (cat) (dog) hair and dander: Secondary | ICD-10-CM | POA: Diagnosis not present

## 2016-10-06 DIAGNOSIS — J3089 Other allergic rhinitis: Secondary | ICD-10-CM | POA: Diagnosis not present

## 2016-10-12 DIAGNOSIS — J301 Allergic rhinitis due to pollen: Secondary | ICD-10-CM | POA: Diagnosis not present

## 2016-10-12 DIAGNOSIS — J3081 Allergic rhinitis due to animal (cat) (dog) hair and dander: Secondary | ICD-10-CM | POA: Diagnosis not present

## 2016-10-12 DIAGNOSIS — J3089 Other allergic rhinitis: Secondary | ICD-10-CM | POA: Diagnosis not present

## 2016-10-18 DIAGNOSIS — L570 Actinic keratosis: Secondary | ICD-10-CM | POA: Diagnosis not present

## 2016-10-18 DIAGNOSIS — L905 Scar conditions and fibrosis of skin: Secondary | ICD-10-CM | POA: Diagnosis not present

## 2016-10-18 DIAGNOSIS — L821 Other seborrheic keratosis: Secondary | ICD-10-CM | POA: Diagnosis not present

## 2016-10-18 DIAGNOSIS — D0472 Carcinoma in situ of skin of left lower limb, including hip: Secondary | ICD-10-CM | POA: Diagnosis not present

## 2016-10-18 DIAGNOSIS — D485 Neoplasm of uncertain behavior of skin: Secondary | ICD-10-CM | POA: Diagnosis not present

## 2016-10-18 DIAGNOSIS — L720 Epidermal cyst: Secondary | ICD-10-CM | POA: Diagnosis not present

## 2016-10-20 DIAGNOSIS — J301 Allergic rhinitis due to pollen: Secondary | ICD-10-CM | POA: Diagnosis not present

## 2016-10-20 DIAGNOSIS — J3081 Allergic rhinitis due to animal (cat) (dog) hair and dander: Secondary | ICD-10-CM | POA: Diagnosis not present

## 2016-10-20 DIAGNOSIS — J3089 Other allergic rhinitis: Secondary | ICD-10-CM | POA: Diagnosis not present

## 2016-10-28 DIAGNOSIS — J3081 Allergic rhinitis due to animal (cat) (dog) hair and dander: Secondary | ICD-10-CM | POA: Diagnosis not present

## 2016-10-28 DIAGNOSIS — J3089 Other allergic rhinitis: Secondary | ICD-10-CM | POA: Diagnosis not present

## 2016-10-28 DIAGNOSIS — J301 Allergic rhinitis due to pollen: Secondary | ICD-10-CM | POA: Diagnosis not present

## 2016-11-04 DIAGNOSIS — J301 Allergic rhinitis due to pollen: Secondary | ICD-10-CM | POA: Diagnosis not present

## 2016-11-04 DIAGNOSIS — J3089 Other allergic rhinitis: Secondary | ICD-10-CM | POA: Diagnosis not present

## 2016-11-04 DIAGNOSIS — J3081 Allergic rhinitis due to animal (cat) (dog) hair and dander: Secondary | ICD-10-CM | POA: Diagnosis not present

## 2016-11-15 DIAGNOSIS — J3081 Allergic rhinitis due to animal (cat) (dog) hair and dander: Secondary | ICD-10-CM | POA: Diagnosis not present

## 2016-11-15 DIAGNOSIS — J3089 Other allergic rhinitis: Secondary | ICD-10-CM | POA: Diagnosis not present

## 2016-11-15 DIAGNOSIS — J301 Allergic rhinitis due to pollen: Secondary | ICD-10-CM | POA: Diagnosis not present

## 2016-11-18 DIAGNOSIS — H903 Sensorineural hearing loss, bilateral: Secondary | ICD-10-CM | POA: Diagnosis not present

## 2016-11-18 DIAGNOSIS — H938X3 Other specified disorders of ear, bilateral: Secondary | ICD-10-CM | POA: Diagnosis not present

## 2016-11-18 DIAGNOSIS — H6123 Impacted cerumen, bilateral: Secondary | ICD-10-CM | POA: Diagnosis not present

## 2016-11-26 DIAGNOSIS — J3081 Allergic rhinitis due to animal (cat) (dog) hair and dander: Secondary | ICD-10-CM | POA: Diagnosis not present

## 2016-11-26 DIAGNOSIS — J3089 Other allergic rhinitis: Secondary | ICD-10-CM | POA: Diagnosis not present

## 2016-11-26 DIAGNOSIS — J301 Allergic rhinitis due to pollen: Secondary | ICD-10-CM | POA: Diagnosis not present

## 2016-12-01 DIAGNOSIS — J3081 Allergic rhinitis due to animal (cat) (dog) hair and dander: Secondary | ICD-10-CM | POA: Diagnosis not present

## 2016-12-01 DIAGNOSIS — J3089 Other allergic rhinitis: Secondary | ICD-10-CM | POA: Diagnosis not present

## 2016-12-01 DIAGNOSIS — J301 Allergic rhinitis due to pollen: Secondary | ICD-10-CM | POA: Diagnosis not present

## 2016-12-02 DIAGNOSIS — H40053 Ocular hypertension, bilateral: Secondary | ICD-10-CM | POA: Diagnosis not present

## 2016-12-10 DIAGNOSIS — H903 Sensorineural hearing loss, bilateral: Secondary | ICD-10-CM | POA: Diagnosis not present

## 2016-12-10 DIAGNOSIS — J3089 Other allergic rhinitis: Secondary | ICD-10-CM | POA: Diagnosis not present

## 2016-12-10 DIAGNOSIS — J301 Allergic rhinitis due to pollen: Secondary | ICD-10-CM | POA: Diagnosis not present

## 2016-12-10 DIAGNOSIS — J3081 Allergic rhinitis due to animal (cat) (dog) hair and dander: Secondary | ICD-10-CM | POA: Diagnosis not present

## 2016-12-13 DIAGNOSIS — Z23 Encounter for immunization: Secondary | ICD-10-CM | POA: Diagnosis not present

## 2016-12-13 DIAGNOSIS — J301 Allergic rhinitis due to pollen: Secondary | ICD-10-CM | POA: Diagnosis not present

## 2016-12-13 DIAGNOSIS — J3089 Other allergic rhinitis: Secondary | ICD-10-CM | POA: Diagnosis not present

## 2016-12-13 DIAGNOSIS — J3081 Allergic rhinitis due to animal (cat) (dog) hair and dander: Secondary | ICD-10-CM | POA: Diagnosis not present

## 2016-12-24 DIAGNOSIS — J3089 Other allergic rhinitis: Secondary | ICD-10-CM | POA: Diagnosis not present

## 2016-12-24 DIAGNOSIS — J301 Allergic rhinitis due to pollen: Secondary | ICD-10-CM | POA: Diagnosis not present

## 2016-12-24 DIAGNOSIS — J3081 Allergic rhinitis due to animal (cat) (dog) hair and dander: Secondary | ICD-10-CM | POA: Diagnosis not present

## 2016-12-31 DIAGNOSIS — J3089 Other allergic rhinitis: Secondary | ICD-10-CM | POA: Diagnosis not present

## 2016-12-31 DIAGNOSIS — G4733 Obstructive sleep apnea (adult) (pediatric): Secondary | ICD-10-CM | POA: Diagnosis not present

## 2016-12-31 DIAGNOSIS — J3081 Allergic rhinitis due to animal (cat) (dog) hair and dander: Secondary | ICD-10-CM | POA: Diagnosis not present

## 2016-12-31 DIAGNOSIS — J301 Allergic rhinitis due to pollen: Secondary | ICD-10-CM | POA: Diagnosis not present

## 2017-01-07 DIAGNOSIS — J301 Allergic rhinitis due to pollen: Secondary | ICD-10-CM | POA: Diagnosis not present

## 2017-01-07 DIAGNOSIS — J3089 Other allergic rhinitis: Secondary | ICD-10-CM | POA: Diagnosis not present

## 2017-01-07 DIAGNOSIS — J3081 Allergic rhinitis due to animal (cat) (dog) hair and dander: Secondary | ICD-10-CM | POA: Diagnosis not present

## 2017-01-12 DIAGNOSIS — J301 Allergic rhinitis due to pollen: Secondary | ICD-10-CM | POA: Diagnosis not present

## 2017-01-12 DIAGNOSIS — J3089 Other allergic rhinitis: Secondary | ICD-10-CM | POA: Diagnosis not present

## 2017-01-12 DIAGNOSIS — J3081 Allergic rhinitis due to animal (cat) (dog) hair and dander: Secondary | ICD-10-CM | POA: Diagnosis not present

## 2017-01-17 DIAGNOSIS — J3081 Allergic rhinitis due to animal (cat) (dog) hair and dander: Secondary | ICD-10-CM | POA: Diagnosis not present

## 2017-01-17 DIAGNOSIS — J3089 Other allergic rhinitis: Secondary | ICD-10-CM | POA: Diagnosis not present

## 2017-01-17 DIAGNOSIS — J301 Allergic rhinitis due to pollen: Secondary | ICD-10-CM | POA: Diagnosis not present

## 2017-01-25 DIAGNOSIS — H903 Sensorineural hearing loss, bilateral: Secondary | ICD-10-CM | POA: Diagnosis not present

## 2017-01-25 DIAGNOSIS — H60332 Swimmer's ear, left ear: Secondary | ICD-10-CM | POA: Diagnosis not present

## 2017-01-25 DIAGNOSIS — J301 Allergic rhinitis due to pollen: Secondary | ICD-10-CM | POA: Diagnosis not present

## 2017-01-25 DIAGNOSIS — H6122 Impacted cerumen, left ear: Secondary | ICD-10-CM | POA: Diagnosis not present

## 2017-01-25 DIAGNOSIS — J3089 Other allergic rhinitis: Secondary | ICD-10-CM | POA: Diagnosis not present

## 2017-01-25 DIAGNOSIS — J3081 Allergic rhinitis due to animal (cat) (dog) hair and dander: Secondary | ICD-10-CM | POA: Diagnosis not present

## 2017-02-04 DIAGNOSIS — J3089 Other allergic rhinitis: Secondary | ICD-10-CM | POA: Diagnosis not present

## 2017-02-04 DIAGNOSIS — J3081 Allergic rhinitis due to animal (cat) (dog) hair and dander: Secondary | ICD-10-CM | POA: Diagnosis not present

## 2017-02-04 DIAGNOSIS — J301 Allergic rhinitis due to pollen: Secondary | ICD-10-CM | POA: Diagnosis not present

## 2017-02-09 DIAGNOSIS — J3081 Allergic rhinitis due to animal (cat) (dog) hair and dander: Secondary | ICD-10-CM | POA: Diagnosis not present

## 2017-02-09 DIAGNOSIS — J3089 Other allergic rhinitis: Secondary | ICD-10-CM | POA: Diagnosis not present

## 2017-02-09 DIAGNOSIS — J301 Allergic rhinitis due to pollen: Secondary | ICD-10-CM | POA: Diagnosis not present

## 2017-02-15 DIAGNOSIS — H903 Sensorineural hearing loss, bilateral: Secondary | ICD-10-CM | POA: Diagnosis not present

## 2017-02-15 DIAGNOSIS — H6122 Impacted cerumen, left ear: Secondary | ICD-10-CM | POA: Diagnosis not present

## 2017-02-17 DIAGNOSIS — J3089 Other allergic rhinitis: Secondary | ICD-10-CM | POA: Diagnosis not present

## 2017-02-17 DIAGNOSIS — J3081 Allergic rhinitis due to animal (cat) (dog) hair and dander: Secondary | ICD-10-CM | POA: Diagnosis not present

## 2017-02-17 DIAGNOSIS — G4733 Obstructive sleep apnea (adult) (pediatric): Secondary | ICD-10-CM | POA: Diagnosis not present

## 2017-02-17 DIAGNOSIS — J301 Allergic rhinitis due to pollen: Secondary | ICD-10-CM | POA: Diagnosis not present

## 2017-02-21 DIAGNOSIS — J301 Allergic rhinitis due to pollen: Secondary | ICD-10-CM | POA: Diagnosis not present

## 2017-02-21 DIAGNOSIS — J3081 Allergic rhinitis due to animal (cat) (dog) hair and dander: Secondary | ICD-10-CM | POA: Diagnosis not present

## 2017-02-21 DIAGNOSIS — J3089 Other allergic rhinitis: Secondary | ICD-10-CM | POA: Diagnosis not present

## 2017-02-24 DIAGNOSIS — J3089 Other allergic rhinitis: Secondary | ICD-10-CM | POA: Diagnosis not present

## 2017-02-24 DIAGNOSIS — J3081 Allergic rhinitis due to animal (cat) (dog) hair and dander: Secondary | ICD-10-CM | POA: Diagnosis not present

## 2017-02-24 DIAGNOSIS — J301 Allergic rhinitis due to pollen: Secondary | ICD-10-CM | POA: Diagnosis not present

## 2017-03-02 DIAGNOSIS — J3081 Allergic rhinitis due to animal (cat) (dog) hair and dander: Secondary | ICD-10-CM | POA: Diagnosis not present

## 2017-03-02 DIAGNOSIS — J301 Allergic rhinitis due to pollen: Secondary | ICD-10-CM | POA: Diagnosis not present

## 2017-03-02 DIAGNOSIS — J3089 Other allergic rhinitis: Secondary | ICD-10-CM | POA: Diagnosis not present

## 2017-03-10 DIAGNOSIS — J3081 Allergic rhinitis due to animal (cat) (dog) hair and dander: Secondary | ICD-10-CM | POA: Diagnosis not present

## 2017-03-10 DIAGNOSIS — J301 Allergic rhinitis due to pollen: Secondary | ICD-10-CM | POA: Diagnosis not present

## 2017-03-10 DIAGNOSIS — J3089 Other allergic rhinitis: Secondary | ICD-10-CM | POA: Diagnosis not present

## 2017-03-16 DIAGNOSIS — J3081 Allergic rhinitis due to animal (cat) (dog) hair and dander: Secondary | ICD-10-CM | POA: Diagnosis not present

## 2017-03-16 DIAGNOSIS — J3089 Other allergic rhinitis: Secondary | ICD-10-CM | POA: Diagnosis not present

## 2017-03-16 DIAGNOSIS — J301 Allergic rhinitis due to pollen: Secondary | ICD-10-CM | POA: Diagnosis not present

## 2017-03-25 DIAGNOSIS — J301 Allergic rhinitis due to pollen: Secondary | ICD-10-CM | POA: Diagnosis not present

## 2017-03-25 DIAGNOSIS — J3089 Other allergic rhinitis: Secondary | ICD-10-CM | POA: Diagnosis not present

## 2017-04-01 DIAGNOSIS — J301 Allergic rhinitis due to pollen: Secondary | ICD-10-CM | POA: Diagnosis not present

## 2017-04-01 DIAGNOSIS — J3089 Other allergic rhinitis: Secondary | ICD-10-CM | POA: Diagnosis not present

## 2017-04-06 DIAGNOSIS — J3089 Other allergic rhinitis: Secondary | ICD-10-CM | POA: Diagnosis not present

## 2017-04-06 DIAGNOSIS — J301 Allergic rhinitis due to pollen: Secondary | ICD-10-CM | POA: Diagnosis not present

## 2017-04-06 DIAGNOSIS — J3081 Allergic rhinitis due to animal (cat) (dog) hair and dander: Secondary | ICD-10-CM | POA: Diagnosis not present

## 2017-04-11 DIAGNOSIS — R82998 Other abnormal findings in urine: Secondary | ICD-10-CM | POA: Diagnosis not present

## 2017-04-11 DIAGNOSIS — I10 Essential (primary) hypertension: Secondary | ICD-10-CM | POA: Diagnosis not present

## 2017-04-11 DIAGNOSIS — Z125 Encounter for screening for malignant neoplasm of prostate: Secondary | ICD-10-CM | POA: Diagnosis not present

## 2017-04-11 DIAGNOSIS — R7309 Other abnormal glucose: Secondary | ICD-10-CM | POA: Diagnosis not present

## 2017-04-15 DIAGNOSIS — J3089 Other allergic rhinitis: Secondary | ICD-10-CM | POA: Diagnosis not present

## 2017-04-15 DIAGNOSIS — J301 Allergic rhinitis due to pollen: Secondary | ICD-10-CM | POA: Diagnosis not present

## 2017-04-19 DIAGNOSIS — G4733 Obstructive sleep apnea (adult) (pediatric): Secondary | ICD-10-CM | POA: Diagnosis not present

## 2017-04-20 DIAGNOSIS — R600 Localized edema: Secondary | ICD-10-CM | POA: Diagnosis not present

## 2017-04-20 DIAGNOSIS — G4733 Obstructive sleep apnea (adult) (pediatric): Secondary | ICD-10-CM | POA: Diagnosis not present

## 2017-04-20 DIAGNOSIS — J3081 Allergic rhinitis due to animal (cat) (dog) hair and dander: Secondary | ICD-10-CM | POA: Diagnosis not present

## 2017-04-20 DIAGNOSIS — H4089 Other specified glaucoma: Secondary | ICD-10-CM | POA: Diagnosis not present

## 2017-04-20 DIAGNOSIS — Z23 Encounter for immunization: Secondary | ICD-10-CM | POA: Diagnosis not present

## 2017-04-20 DIAGNOSIS — R69 Illness, unspecified: Secondary | ICD-10-CM | POA: Diagnosis not present

## 2017-04-20 DIAGNOSIS — J45998 Other asthma: Secondary | ICD-10-CM | POA: Diagnosis not present

## 2017-04-20 DIAGNOSIS — Z0000 Encounter for general adult medical examination without abnormal findings: Secondary | ICD-10-CM | POA: Diagnosis not present

## 2017-04-20 DIAGNOSIS — J3089 Other allergic rhinitis: Secondary | ICD-10-CM | POA: Diagnosis not present

## 2017-04-20 DIAGNOSIS — J301 Allergic rhinitis due to pollen: Secondary | ICD-10-CM | POA: Diagnosis not present

## 2017-04-20 DIAGNOSIS — Z86010 Personal history of colonic polyps: Secondary | ICD-10-CM | POA: Diagnosis not present

## 2017-04-20 DIAGNOSIS — R7309 Other abnormal glucose: Secondary | ICD-10-CM | POA: Diagnosis not present

## 2017-04-20 DIAGNOSIS — G43909 Migraine, unspecified, not intractable, without status migrainosus: Secondary | ICD-10-CM | POA: Diagnosis not present

## 2017-04-20 DIAGNOSIS — K219 Gastro-esophageal reflux disease without esophagitis: Secondary | ICD-10-CM | POA: Diagnosis not present

## 2017-04-21 DIAGNOSIS — Z1212 Encounter for screening for malignant neoplasm of rectum: Secondary | ICD-10-CM | POA: Diagnosis not present

## 2017-04-28 DIAGNOSIS — J3089 Other allergic rhinitis: Secondary | ICD-10-CM | POA: Diagnosis not present

## 2017-04-28 DIAGNOSIS — J3081 Allergic rhinitis due to animal (cat) (dog) hair and dander: Secondary | ICD-10-CM | POA: Diagnosis not present

## 2017-04-28 DIAGNOSIS — J301 Allergic rhinitis due to pollen: Secondary | ICD-10-CM | POA: Diagnosis not present

## 2017-05-04 DIAGNOSIS — J3081 Allergic rhinitis due to animal (cat) (dog) hair and dander: Secondary | ICD-10-CM | POA: Diagnosis not present

## 2017-05-04 DIAGNOSIS — J301 Allergic rhinitis due to pollen: Secondary | ICD-10-CM | POA: Diagnosis not present

## 2017-05-04 DIAGNOSIS — J3089 Other allergic rhinitis: Secondary | ICD-10-CM | POA: Diagnosis not present

## 2017-05-11 DIAGNOSIS — J301 Allergic rhinitis due to pollen: Secondary | ICD-10-CM | POA: Diagnosis not present

## 2017-05-11 DIAGNOSIS — J3081 Allergic rhinitis due to animal (cat) (dog) hair and dander: Secondary | ICD-10-CM | POA: Diagnosis not present

## 2017-05-11 DIAGNOSIS — J3089 Other allergic rhinitis: Secondary | ICD-10-CM | POA: Diagnosis not present

## 2017-05-19 DIAGNOSIS — J3081 Allergic rhinitis due to animal (cat) (dog) hair and dander: Secondary | ICD-10-CM | POA: Diagnosis not present

## 2017-05-19 DIAGNOSIS — J3089 Other allergic rhinitis: Secondary | ICD-10-CM | POA: Diagnosis not present

## 2017-05-19 DIAGNOSIS — J301 Allergic rhinitis due to pollen: Secondary | ICD-10-CM | POA: Diagnosis not present

## 2017-05-25 DIAGNOSIS — J301 Allergic rhinitis due to pollen: Secondary | ICD-10-CM | POA: Diagnosis not present

## 2017-05-25 DIAGNOSIS — J3081 Allergic rhinitis due to animal (cat) (dog) hair and dander: Secondary | ICD-10-CM | POA: Diagnosis not present

## 2017-05-25 DIAGNOSIS — J3089 Other allergic rhinitis: Secondary | ICD-10-CM | POA: Diagnosis not present

## 2017-06-01 DIAGNOSIS — J301 Allergic rhinitis due to pollen: Secondary | ICD-10-CM | POA: Diagnosis not present

## 2017-06-01 DIAGNOSIS — J3089 Other allergic rhinitis: Secondary | ICD-10-CM | POA: Diagnosis not present

## 2017-06-01 DIAGNOSIS — J3081 Allergic rhinitis due to animal (cat) (dog) hair and dander: Secondary | ICD-10-CM | POA: Diagnosis not present

## 2017-06-02 DIAGNOSIS — H40053 Ocular hypertension, bilateral: Secondary | ICD-10-CM | POA: Diagnosis not present

## 2017-06-02 DIAGNOSIS — H5213 Myopia, bilateral: Secondary | ICD-10-CM | POA: Diagnosis not present

## 2017-06-08 DIAGNOSIS — J301 Allergic rhinitis due to pollen: Secondary | ICD-10-CM | POA: Diagnosis not present

## 2017-06-08 DIAGNOSIS — J3081 Allergic rhinitis due to animal (cat) (dog) hair and dander: Secondary | ICD-10-CM | POA: Diagnosis not present

## 2017-06-08 DIAGNOSIS — J3089 Other allergic rhinitis: Secondary | ICD-10-CM | POA: Diagnosis not present

## 2017-06-13 DIAGNOSIS — J3081 Allergic rhinitis due to animal (cat) (dog) hair and dander: Secondary | ICD-10-CM | POA: Diagnosis not present

## 2017-06-13 DIAGNOSIS — J301 Allergic rhinitis due to pollen: Secondary | ICD-10-CM | POA: Diagnosis not present

## 2017-06-13 DIAGNOSIS — J3089 Other allergic rhinitis: Secondary | ICD-10-CM | POA: Diagnosis not present

## 2017-06-14 DIAGNOSIS — J3089 Other allergic rhinitis: Secondary | ICD-10-CM | POA: Diagnosis not present

## 2017-06-14 DIAGNOSIS — J3081 Allergic rhinitis due to animal (cat) (dog) hair and dander: Secondary | ICD-10-CM | POA: Diagnosis not present

## 2017-06-14 DIAGNOSIS — J301 Allergic rhinitis due to pollen: Secondary | ICD-10-CM | POA: Diagnosis not present

## 2017-06-14 DIAGNOSIS — J4520 Mild intermittent asthma, uncomplicated: Secondary | ICD-10-CM | POA: Diagnosis not present

## 2017-06-20 DIAGNOSIS — J3081 Allergic rhinitis due to animal (cat) (dog) hair and dander: Secondary | ICD-10-CM | POA: Diagnosis not present

## 2017-06-20 DIAGNOSIS — J3089 Other allergic rhinitis: Secondary | ICD-10-CM | POA: Diagnosis not present

## 2017-06-20 DIAGNOSIS — J301 Allergic rhinitis due to pollen: Secondary | ICD-10-CM | POA: Diagnosis not present

## 2017-06-22 DIAGNOSIS — J3081 Allergic rhinitis due to animal (cat) (dog) hair and dander: Secondary | ICD-10-CM | POA: Diagnosis not present

## 2017-06-22 DIAGNOSIS — J301 Allergic rhinitis due to pollen: Secondary | ICD-10-CM | POA: Diagnosis not present

## 2017-06-22 DIAGNOSIS — J3089 Other allergic rhinitis: Secondary | ICD-10-CM | POA: Diagnosis not present

## 2017-06-29 DIAGNOSIS — J3089 Other allergic rhinitis: Secondary | ICD-10-CM | POA: Diagnosis not present

## 2017-06-29 DIAGNOSIS — J3081 Allergic rhinitis due to animal (cat) (dog) hair and dander: Secondary | ICD-10-CM | POA: Diagnosis not present

## 2017-06-29 DIAGNOSIS — J301 Allergic rhinitis due to pollen: Secondary | ICD-10-CM | POA: Diagnosis not present

## 2017-07-06 DIAGNOSIS — J301 Allergic rhinitis due to pollen: Secondary | ICD-10-CM | POA: Diagnosis not present

## 2017-07-06 DIAGNOSIS — J3081 Allergic rhinitis due to animal (cat) (dog) hair and dander: Secondary | ICD-10-CM | POA: Diagnosis not present

## 2017-07-06 DIAGNOSIS — J3089 Other allergic rhinitis: Secondary | ICD-10-CM | POA: Diagnosis not present

## 2017-07-12 DIAGNOSIS — J3089 Other allergic rhinitis: Secondary | ICD-10-CM | POA: Diagnosis not present

## 2017-07-12 DIAGNOSIS — J3081 Allergic rhinitis due to animal (cat) (dog) hair and dander: Secondary | ICD-10-CM | POA: Diagnosis not present

## 2017-07-12 DIAGNOSIS — J301 Allergic rhinitis due to pollen: Secondary | ICD-10-CM | POA: Diagnosis not present

## 2017-07-14 DIAGNOSIS — J301 Allergic rhinitis due to pollen: Secondary | ICD-10-CM | POA: Diagnosis not present

## 2017-07-14 DIAGNOSIS — J3089 Other allergic rhinitis: Secondary | ICD-10-CM | POA: Diagnosis not present

## 2017-07-14 DIAGNOSIS — J3081 Allergic rhinitis due to animal (cat) (dog) hair and dander: Secondary | ICD-10-CM | POA: Diagnosis not present

## 2017-07-18 DIAGNOSIS — Z974 Presence of external hearing-aid: Secondary | ICD-10-CM | POA: Diagnosis not present

## 2017-07-18 DIAGNOSIS — H903 Sensorineural hearing loss, bilateral: Secondary | ICD-10-CM | POA: Diagnosis not present

## 2017-07-18 DIAGNOSIS — H6121 Impacted cerumen, right ear: Secondary | ICD-10-CM | POA: Diagnosis not present

## 2017-07-21 DIAGNOSIS — J301 Allergic rhinitis due to pollen: Secondary | ICD-10-CM | POA: Diagnosis not present

## 2017-07-21 DIAGNOSIS — J3081 Allergic rhinitis due to animal (cat) (dog) hair and dander: Secondary | ICD-10-CM | POA: Diagnosis not present

## 2017-07-21 DIAGNOSIS — J3089 Other allergic rhinitis: Secondary | ICD-10-CM | POA: Diagnosis not present

## 2017-07-29 DIAGNOSIS — J301 Allergic rhinitis due to pollen: Secondary | ICD-10-CM | POA: Diagnosis not present

## 2017-07-29 DIAGNOSIS — J3089 Other allergic rhinitis: Secondary | ICD-10-CM | POA: Diagnosis not present

## 2017-07-29 DIAGNOSIS — J3081 Allergic rhinitis due to animal (cat) (dog) hair and dander: Secondary | ICD-10-CM | POA: Diagnosis not present

## 2017-08-02 DIAGNOSIS — J3081 Allergic rhinitis due to animal (cat) (dog) hair and dander: Secondary | ICD-10-CM | POA: Diagnosis not present

## 2017-08-02 DIAGNOSIS — J3089 Other allergic rhinitis: Secondary | ICD-10-CM | POA: Diagnosis not present

## 2017-08-02 DIAGNOSIS — J301 Allergic rhinitis due to pollen: Secondary | ICD-10-CM | POA: Diagnosis not present

## 2017-08-04 DIAGNOSIS — J3089 Other allergic rhinitis: Secondary | ICD-10-CM | POA: Diagnosis not present

## 2017-08-04 DIAGNOSIS — J301 Allergic rhinitis due to pollen: Secondary | ICD-10-CM | POA: Diagnosis not present

## 2017-08-04 DIAGNOSIS — J3081 Allergic rhinitis due to animal (cat) (dog) hair and dander: Secondary | ICD-10-CM | POA: Diagnosis not present

## 2017-08-10 DIAGNOSIS — J3089 Other allergic rhinitis: Secondary | ICD-10-CM | POA: Diagnosis not present

## 2017-08-10 DIAGNOSIS — J3081 Allergic rhinitis due to animal (cat) (dog) hair and dander: Secondary | ICD-10-CM | POA: Diagnosis not present

## 2017-08-10 DIAGNOSIS — J301 Allergic rhinitis due to pollen: Secondary | ICD-10-CM | POA: Diagnosis not present

## 2017-08-17 DIAGNOSIS — J3081 Allergic rhinitis due to animal (cat) (dog) hair and dander: Secondary | ICD-10-CM | POA: Diagnosis not present

## 2017-08-17 DIAGNOSIS — J3089 Other allergic rhinitis: Secondary | ICD-10-CM | POA: Diagnosis not present

## 2017-08-17 DIAGNOSIS — J301 Allergic rhinitis due to pollen: Secondary | ICD-10-CM | POA: Diagnosis not present

## 2017-08-24 DIAGNOSIS — J301 Allergic rhinitis due to pollen: Secondary | ICD-10-CM | POA: Diagnosis not present

## 2017-08-24 DIAGNOSIS — J3089 Other allergic rhinitis: Secondary | ICD-10-CM | POA: Diagnosis not present

## 2017-08-24 DIAGNOSIS — J3081 Allergic rhinitis due to animal (cat) (dog) hair and dander: Secondary | ICD-10-CM | POA: Diagnosis not present

## 2017-09-06 DIAGNOSIS — J301 Allergic rhinitis due to pollen: Secondary | ICD-10-CM | POA: Diagnosis not present

## 2017-09-06 DIAGNOSIS — J3089 Other allergic rhinitis: Secondary | ICD-10-CM | POA: Diagnosis not present

## 2017-09-06 DIAGNOSIS — J3081 Allergic rhinitis due to animal (cat) (dog) hair and dander: Secondary | ICD-10-CM | POA: Diagnosis not present

## 2017-09-14 DIAGNOSIS — J3089 Other allergic rhinitis: Secondary | ICD-10-CM | POA: Diagnosis not present

## 2017-09-14 DIAGNOSIS — J3081 Allergic rhinitis due to animal (cat) (dog) hair and dander: Secondary | ICD-10-CM | POA: Diagnosis not present

## 2017-09-14 DIAGNOSIS — J301 Allergic rhinitis due to pollen: Secondary | ICD-10-CM | POA: Diagnosis not present

## 2017-09-21 DIAGNOSIS — J3089 Other allergic rhinitis: Secondary | ICD-10-CM | POA: Diagnosis not present

## 2017-09-21 DIAGNOSIS — J3081 Allergic rhinitis due to animal (cat) (dog) hair and dander: Secondary | ICD-10-CM | POA: Diagnosis not present

## 2017-09-21 DIAGNOSIS — J301 Allergic rhinitis due to pollen: Secondary | ICD-10-CM | POA: Diagnosis not present

## 2017-09-26 DIAGNOSIS — H6242 Otitis externa in other diseases classified elsewhere, left ear: Secondary | ICD-10-CM | POA: Diagnosis not present

## 2017-09-26 DIAGNOSIS — B369 Superficial mycosis, unspecified: Secondary | ICD-10-CM | POA: Diagnosis not present

## 2017-09-28 DIAGNOSIS — J3081 Allergic rhinitis due to animal (cat) (dog) hair and dander: Secondary | ICD-10-CM | POA: Diagnosis not present

## 2017-09-28 DIAGNOSIS — J3089 Other allergic rhinitis: Secondary | ICD-10-CM | POA: Diagnosis not present

## 2017-09-28 DIAGNOSIS — J301 Allergic rhinitis due to pollen: Secondary | ICD-10-CM | POA: Diagnosis not present

## 2017-10-05 DIAGNOSIS — L430 Hypertrophic lichen planus: Secondary | ICD-10-CM | POA: Diagnosis not present

## 2017-10-05 DIAGNOSIS — D2371 Other benign neoplasm of skin of right lower limb, including hip: Secondary | ICD-10-CM | POA: Diagnosis not present

## 2017-10-05 DIAGNOSIS — L820 Inflamed seborrheic keratosis: Secondary | ICD-10-CM | POA: Diagnosis not present

## 2017-10-05 DIAGNOSIS — J3081 Allergic rhinitis due to animal (cat) (dog) hair and dander: Secondary | ICD-10-CM | POA: Diagnosis not present

## 2017-10-05 DIAGNOSIS — J301 Allergic rhinitis due to pollen: Secondary | ICD-10-CM | POA: Diagnosis not present

## 2017-10-05 DIAGNOSIS — J3089 Other allergic rhinitis: Secondary | ICD-10-CM | POA: Diagnosis not present

## 2017-10-05 DIAGNOSIS — D485 Neoplasm of uncertain behavior of skin: Secondary | ICD-10-CM | POA: Diagnosis not present

## 2017-10-05 DIAGNOSIS — L821 Other seborrheic keratosis: Secondary | ICD-10-CM | POA: Diagnosis not present

## 2017-10-05 DIAGNOSIS — L738 Other specified follicular disorders: Secondary | ICD-10-CM | POA: Diagnosis not present

## 2017-10-10 DIAGNOSIS — J3081 Allergic rhinitis due to animal (cat) (dog) hair and dander: Secondary | ICD-10-CM | POA: Diagnosis not present

## 2017-10-10 DIAGNOSIS — J3089 Other allergic rhinitis: Secondary | ICD-10-CM | POA: Diagnosis not present

## 2017-10-10 DIAGNOSIS — J301 Allergic rhinitis due to pollen: Secondary | ICD-10-CM | POA: Diagnosis not present

## 2017-11-02 DIAGNOSIS — J301 Allergic rhinitis due to pollen: Secondary | ICD-10-CM | POA: Diagnosis not present

## 2017-11-02 DIAGNOSIS — J3089 Other allergic rhinitis: Secondary | ICD-10-CM | POA: Diagnosis not present

## 2017-11-02 DIAGNOSIS — J3081 Allergic rhinitis due to animal (cat) (dog) hair and dander: Secondary | ICD-10-CM | POA: Diagnosis not present

## 2017-11-03 DIAGNOSIS — Z974 Presence of external hearing-aid: Secondary | ICD-10-CM | POA: Diagnosis not present

## 2017-11-03 DIAGNOSIS — B369 Superficial mycosis, unspecified: Secondary | ICD-10-CM | POA: Diagnosis not present

## 2017-11-03 DIAGNOSIS — H903 Sensorineural hearing loss, bilateral: Secondary | ICD-10-CM | POA: Diagnosis not present

## 2017-11-03 DIAGNOSIS — H6242 Otitis externa in other diseases classified elsewhere, left ear: Secondary | ICD-10-CM | POA: Diagnosis not present

## 2017-11-03 DIAGNOSIS — H938X2 Other specified disorders of left ear: Secondary | ICD-10-CM | POA: Diagnosis not present

## 2017-11-10 DIAGNOSIS — J3089 Other allergic rhinitis: Secondary | ICD-10-CM | POA: Diagnosis not present

## 2017-11-10 DIAGNOSIS — J301 Allergic rhinitis due to pollen: Secondary | ICD-10-CM | POA: Diagnosis not present

## 2017-11-10 DIAGNOSIS — J3081 Allergic rhinitis due to animal (cat) (dog) hair and dander: Secondary | ICD-10-CM | POA: Diagnosis not present

## 2017-11-18 DIAGNOSIS — J3089 Other allergic rhinitis: Secondary | ICD-10-CM | POA: Diagnosis not present

## 2017-11-18 DIAGNOSIS — J301 Allergic rhinitis due to pollen: Secondary | ICD-10-CM | POA: Diagnosis not present

## 2017-11-18 DIAGNOSIS — J3081 Allergic rhinitis due to animal (cat) (dog) hair and dander: Secondary | ICD-10-CM | POA: Diagnosis not present

## 2017-11-21 DIAGNOSIS — J3081 Allergic rhinitis due to animal (cat) (dog) hair and dander: Secondary | ICD-10-CM | POA: Diagnosis not present

## 2017-11-21 DIAGNOSIS — J301 Allergic rhinitis due to pollen: Secondary | ICD-10-CM | POA: Diagnosis not present

## 2017-11-21 DIAGNOSIS — J3089 Other allergic rhinitis: Secondary | ICD-10-CM | POA: Diagnosis not present

## 2017-11-29 DIAGNOSIS — G4733 Obstructive sleep apnea (adult) (pediatric): Secondary | ICD-10-CM | POA: Diagnosis not present

## 2017-12-01 DIAGNOSIS — J3089 Other allergic rhinitis: Secondary | ICD-10-CM | POA: Diagnosis not present

## 2017-12-01 DIAGNOSIS — J3081 Allergic rhinitis due to animal (cat) (dog) hair and dander: Secondary | ICD-10-CM | POA: Diagnosis not present

## 2017-12-01 DIAGNOSIS — J301 Allergic rhinitis due to pollen: Secondary | ICD-10-CM | POA: Diagnosis not present

## 2017-12-05 DIAGNOSIS — H401131 Primary open-angle glaucoma, bilateral, mild stage: Secondary | ICD-10-CM | POA: Diagnosis not present

## 2017-12-07 DIAGNOSIS — J301 Allergic rhinitis due to pollen: Secondary | ICD-10-CM | POA: Diagnosis not present

## 2017-12-07 DIAGNOSIS — J3089 Other allergic rhinitis: Secondary | ICD-10-CM | POA: Diagnosis not present

## 2017-12-07 DIAGNOSIS — J3081 Allergic rhinitis due to animal (cat) (dog) hair and dander: Secondary | ICD-10-CM | POA: Diagnosis not present

## 2017-12-16 DIAGNOSIS — J301 Allergic rhinitis due to pollen: Secondary | ICD-10-CM | POA: Diagnosis not present

## 2017-12-16 DIAGNOSIS — J3089 Other allergic rhinitis: Secondary | ICD-10-CM | POA: Diagnosis not present

## 2017-12-21 DIAGNOSIS — J3081 Allergic rhinitis due to animal (cat) (dog) hair and dander: Secondary | ICD-10-CM | POA: Diagnosis not present

## 2017-12-21 DIAGNOSIS — J3089 Other allergic rhinitis: Secondary | ICD-10-CM | POA: Diagnosis not present

## 2017-12-21 DIAGNOSIS — J301 Allergic rhinitis due to pollen: Secondary | ICD-10-CM | POA: Diagnosis not present

## 2017-12-28 DIAGNOSIS — Z23 Encounter for immunization: Secondary | ICD-10-CM | POA: Diagnosis not present

## 2017-12-28 DIAGNOSIS — J3089 Other allergic rhinitis: Secondary | ICD-10-CM | POA: Diagnosis not present

## 2017-12-28 DIAGNOSIS — J3081 Allergic rhinitis due to animal (cat) (dog) hair and dander: Secondary | ICD-10-CM | POA: Diagnosis not present

## 2017-12-28 DIAGNOSIS — J301 Allergic rhinitis due to pollen: Secondary | ICD-10-CM | POA: Diagnosis not present

## 2018-01-05 DIAGNOSIS — J301 Allergic rhinitis due to pollen: Secondary | ICD-10-CM | POA: Diagnosis not present

## 2018-01-05 DIAGNOSIS — J3081 Allergic rhinitis due to animal (cat) (dog) hair and dander: Secondary | ICD-10-CM | POA: Diagnosis not present

## 2018-01-05 DIAGNOSIS — J3089 Other allergic rhinitis: Secondary | ICD-10-CM | POA: Diagnosis not present

## 2018-01-10 DIAGNOSIS — H6123 Impacted cerumen, bilateral: Secondary | ICD-10-CM | POA: Insufficient documentation

## 2018-01-10 DIAGNOSIS — H6121 Impacted cerumen, right ear: Secondary | ICD-10-CM | POA: Diagnosis not present

## 2018-01-10 DIAGNOSIS — J3089 Other allergic rhinitis: Secondary | ICD-10-CM | POA: Diagnosis not present

## 2018-01-10 DIAGNOSIS — H938X2 Other specified disorders of left ear: Secondary | ICD-10-CM | POA: Diagnosis not present

## 2018-01-10 DIAGNOSIS — J301 Allergic rhinitis due to pollen: Secondary | ICD-10-CM | POA: Diagnosis not present

## 2018-01-16 DIAGNOSIS — J3089 Other allergic rhinitis: Secondary | ICD-10-CM | POA: Diagnosis not present

## 2018-01-16 DIAGNOSIS — J301 Allergic rhinitis due to pollen: Secondary | ICD-10-CM | POA: Diagnosis not present

## 2018-01-16 DIAGNOSIS — J3081 Allergic rhinitis due to animal (cat) (dog) hair and dander: Secondary | ICD-10-CM | POA: Diagnosis not present

## 2018-01-25 DIAGNOSIS — J3089 Other allergic rhinitis: Secondary | ICD-10-CM | POA: Diagnosis not present

## 2018-01-25 DIAGNOSIS — J301 Allergic rhinitis due to pollen: Secondary | ICD-10-CM | POA: Diagnosis not present

## 2018-01-25 DIAGNOSIS — J3081 Allergic rhinitis due to animal (cat) (dog) hair and dander: Secondary | ICD-10-CM | POA: Diagnosis not present

## 2018-01-31 DIAGNOSIS — J301 Allergic rhinitis due to pollen: Secondary | ICD-10-CM | POA: Diagnosis not present

## 2018-01-31 DIAGNOSIS — J3089 Other allergic rhinitis: Secondary | ICD-10-CM | POA: Diagnosis not present

## 2018-01-31 DIAGNOSIS — J3081 Allergic rhinitis due to animal (cat) (dog) hair and dander: Secondary | ICD-10-CM | POA: Diagnosis not present

## 2018-02-06 DIAGNOSIS — J3081 Allergic rhinitis due to animal (cat) (dog) hair and dander: Secondary | ICD-10-CM | POA: Diagnosis not present

## 2018-02-06 DIAGNOSIS — J301 Allergic rhinitis due to pollen: Secondary | ICD-10-CM | POA: Diagnosis not present

## 2018-02-06 DIAGNOSIS — J3089 Other allergic rhinitis: Secondary | ICD-10-CM | POA: Diagnosis not present

## 2018-02-20 DIAGNOSIS — J3089 Other allergic rhinitis: Secondary | ICD-10-CM | POA: Diagnosis not present

## 2018-02-20 DIAGNOSIS — J301 Allergic rhinitis due to pollen: Secondary | ICD-10-CM | POA: Diagnosis not present

## 2018-02-20 DIAGNOSIS — J3081 Allergic rhinitis due to animal (cat) (dog) hair and dander: Secondary | ICD-10-CM | POA: Diagnosis not present

## 2018-02-27 DIAGNOSIS — J301 Allergic rhinitis due to pollen: Secondary | ICD-10-CM | POA: Diagnosis not present

## 2018-02-27 DIAGNOSIS — J3089 Other allergic rhinitis: Secondary | ICD-10-CM | POA: Diagnosis not present

## 2018-03-07 DIAGNOSIS — J3081 Allergic rhinitis due to animal (cat) (dog) hair and dander: Secondary | ICD-10-CM | POA: Diagnosis not present

## 2018-03-07 DIAGNOSIS — J301 Allergic rhinitis due to pollen: Secondary | ICD-10-CM | POA: Diagnosis not present

## 2018-03-07 DIAGNOSIS — J3089 Other allergic rhinitis: Secondary | ICD-10-CM | POA: Diagnosis not present

## 2018-03-09 DIAGNOSIS — G4733 Obstructive sleep apnea (adult) (pediatric): Secondary | ICD-10-CM | POA: Diagnosis not present

## 2018-03-15 DIAGNOSIS — J3089 Other allergic rhinitis: Secondary | ICD-10-CM | POA: Diagnosis not present

## 2018-03-15 DIAGNOSIS — J3081 Allergic rhinitis due to animal (cat) (dog) hair and dander: Secondary | ICD-10-CM | POA: Diagnosis not present

## 2018-03-15 DIAGNOSIS — J301 Allergic rhinitis due to pollen: Secondary | ICD-10-CM | POA: Diagnosis not present

## 2018-03-23 DIAGNOSIS — J301 Allergic rhinitis due to pollen: Secondary | ICD-10-CM | POA: Diagnosis not present

## 2018-03-23 DIAGNOSIS — J3081 Allergic rhinitis due to animal (cat) (dog) hair and dander: Secondary | ICD-10-CM | POA: Diagnosis not present

## 2018-03-23 DIAGNOSIS — J3089 Other allergic rhinitis: Secondary | ICD-10-CM | POA: Diagnosis not present

## 2018-03-30 DIAGNOSIS — J3081 Allergic rhinitis due to animal (cat) (dog) hair and dander: Secondary | ICD-10-CM | POA: Diagnosis not present

## 2018-03-30 DIAGNOSIS — J3089 Other allergic rhinitis: Secondary | ICD-10-CM | POA: Diagnosis not present

## 2018-03-30 DIAGNOSIS — J301 Allergic rhinitis due to pollen: Secondary | ICD-10-CM | POA: Diagnosis not present

## 2018-04-05 DIAGNOSIS — J3089 Other allergic rhinitis: Secondary | ICD-10-CM | POA: Diagnosis not present

## 2018-04-05 DIAGNOSIS — J3081 Allergic rhinitis due to animal (cat) (dog) hair and dander: Secondary | ICD-10-CM | POA: Diagnosis not present

## 2018-04-05 DIAGNOSIS — J301 Allergic rhinitis due to pollen: Secondary | ICD-10-CM | POA: Diagnosis not present

## 2018-04-13 DIAGNOSIS — J301 Allergic rhinitis due to pollen: Secondary | ICD-10-CM | POA: Diagnosis not present

## 2018-04-13 DIAGNOSIS — J3081 Allergic rhinitis due to animal (cat) (dog) hair and dander: Secondary | ICD-10-CM | POA: Diagnosis not present

## 2018-04-13 DIAGNOSIS — J3089 Other allergic rhinitis: Secondary | ICD-10-CM | POA: Diagnosis not present

## 2018-04-19 DIAGNOSIS — J301 Allergic rhinitis due to pollen: Secondary | ICD-10-CM | POA: Diagnosis not present

## 2018-04-19 DIAGNOSIS — J3089 Other allergic rhinitis: Secondary | ICD-10-CM | POA: Diagnosis not present

## 2018-04-19 DIAGNOSIS — J3081 Allergic rhinitis due to animal (cat) (dog) hair and dander: Secondary | ICD-10-CM | POA: Diagnosis not present

## 2018-04-20 DIAGNOSIS — R7309 Other abnormal glucose: Secondary | ICD-10-CM | POA: Diagnosis not present

## 2018-04-20 DIAGNOSIS — Z125 Encounter for screening for malignant neoplasm of prostate: Secondary | ICD-10-CM | POA: Diagnosis not present

## 2018-04-20 DIAGNOSIS — R82998 Other abnormal findings in urine: Secondary | ICD-10-CM | POA: Diagnosis not present

## 2018-04-20 DIAGNOSIS — Z79899 Other long term (current) drug therapy: Secondary | ICD-10-CM | POA: Diagnosis not present

## 2018-04-26 DIAGNOSIS — J3081 Allergic rhinitis due to animal (cat) (dog) hair and dander: Secondary | ICD-10-CM | POA: Diagnosis not present

## 2018-04-26 DIAGNOSIS — J301 Allergic rhinitis due to pollen: Secondary | ICD-10-CM | POA: Diagnosis not present

## 2018-04-26 DIAGNOSIS — J3089 Other allergic rhinitis: Secondary | ICD-10-CM | POA: Diagnosis not present

## 2018-04-27 DIAGNOSIS — Z0000 Encounter for general adult medical examination without abnormal findings: Secondary | ICD-10-CM | POA: Diagnosis not present

## 2018-04-27 DIAGNOSIS — G4733 Obstructive sleep apnea (adult) (pediatric): Secondary | ICD-10-CM | POA: Diagnosis not present

## 2018-04-27 DIAGNOSIS — R7309 Other abnormal glucose: Secondary | ICD-10-CM | POA: Diagnosis not present

## 2018-04-27 DIAGNOSIS — R69 Illness, unspecified: Secondary | ICD-10-CM | POA: Diagnosis not present

## 2018-04-27 DIAGNOSIS — H4089 Other specified glaucoma: Secondary | ICD-10-CM | POA: Diagnosis not present

## 2018-04-27 DIAGNOSIS — K219 Gastro-esophageal reflux disease without esophagitis: Secondary | ICD-10-CM | POA: Diagnosis not present

## 2018-04-27 DIAGNOSIS — G43909 Migraine, unspecified, not intractable, without status migrainosus: Secondary | ICD-10-CM | POA: Diagnosis not present

## 2018-04-27 DIAGNOSIS — Z86010 Personal history of colonic polyps: Secondary | ICD-10-CM | POA: Diagnosis not present

## 2018-04-27 DIAGNOSIS — J45998 Other asthma: Secondary | ICD-10-CM | POA: Diagnosis not present

## 2018-04-27 DIAGNOSIS — Z859 Personal history of malignant neoplasm, unspecified: Secondary | ICD-10-CM | POA: Diagnosis not present

## 2018-04-28 DIAGNOSIS — Z1212 Encounter for screening for malignant neoplasm of rectum: Secondary | ICD-10-CM | POA: Diagnosis not present

## 2018-05-03 DIAGNOSIS — J3081 Allergic rhinitis due to animal (cat) (dog) hair and dander: Secondary | ICD-10-CM | POA: Diagnosis not present

## 2018-05-03 DIAGNOSIS — J3089 Other allergic rhinitis: Secondary | ICD-10-CM | POA: Diagnosis not present

## 2018-05-03 DIAGNOSIS — J301 Allergic rhinitis due to pollen: Secondary | ICD-10-CM | POA: Diagnosis not present

## 2018-05-11 DIAGNOSIS — J301 Allergic rhinitis due to pollen: Secondary | ICD-10-CM | POA: Diagnosis not present

## 2018-05-11 DIAGNOSIS — J3081 Allergic rhinitis due to animal (cat) (dog) hair and dander: Secondary | ICD-10-CM | POA: Diagnosis not present

## 2018-05-11 DIAGNOSIS — J3089 Other allergic rhinitis: Secondary | ICD-10-CM | POA: Diagnosis not present

## 2018-05-17 DIAGNOSIS — G43909 Migraine, unspecified, not intractable, without status migrainosus: Secondary | ICD-10-CM | POA: Diagnosis not present

## 2018-05-19 DIAGNOSIS — J3089 Other allergic rhinitis: Secondary | ICD-10-CM | POA: Diagnosis not present

## 2018-05-19 DIAGNOSIS — J301 Allergic rhinitis due to pollen: Secondary | ICD-10-CM | POA: Diagnosis not present

## 2018-05-19 DIAGNOSIS — J3081 Allergic rhinitis due to animal (cat) (dog) hair and dander: Secondary | ICD-10-CM | POA: Diagnosis not present

## 2018-05-24 DIAGNOSIS — J3081 Allergic rhinitis due to animal (cat) (dog) hair and dander: Secondary | ICD-10-CM | POA: Diagnosis not present

## 2018-05-24 DIAGNOSIS — J301 Allergic rhinitis due to pollen: Secondary | ICD-10-CM | POA: Diagnosis not present

## 2018-05-24 DIAGNOSIS — J3089 Other allergic rhinitis: Secondary | ICD-10-CM | POA: Diagnosis not present

## 2018-05-26 DIAGNOSIS — J3089 Other allergic rhinitis: Secondary | ICD-10-CM | POA: Diagnosis not present

## 2018-05-26 DIAGNOSIS — J3081 Allergic rhinitis due to animal (cat) (dog) hair and dander: Secondary | ICD-10-CM | POA: Diagnosis not present

## 2018-05-26 DIAGNOSIS — J301 Allergic rhinitis due to pollen: Secondary | ICD-10-CM | POA: Diagnosis not present

## 2018-06-05 DIAGNOSIS — J3081 Allergic rhinitis due to animal (cat) (dog) hair and dander: Secondary | ICD-10-CM | POA: Diagnosis not present

## 2018-06-05 DIAGNOSIS — J301 Allergic rhinitis due to pollen: Secondary | ICD-10-CM | POA: Diagnosis not present

## 2018-06-05 DIAGNOSIS — J3089 Other allergic rhinitis: Secondary | ICD-10-CM | POA: Diagnosis not present

## 2018-06-14 DIAGNOSIS — J301 Allergic rhinitis due to pollen: Secondary | ICD-10-CM | POA: Diagnosis not present

## 2018-06-14 DIAGNOSIS — J3089 Other allergic rhinitis: Secondary | ICD-10-CM | POA: Diagnosis not present

## 2018-06-14 DIAGNOSIS — J3081 Allergic rhinitis due to animal (cat) (dog) hair and dander: Secondary | ICD-10-CM | POA: Diagnosis not present

## 2018-06-23 DIAGNOSIS — J3089 Other allergic rhinitis: Secondary | ICD-10-CM | POA: Diagnosis not present

## 2018-06-23 DIAGNOSIS — J301 Allergic rhinitis due to pollen: Secondary | ICD-10-CM | POA: Diagnosis not present

## 2018-06-28 DIAGNOSIS — J3081 Allergic rhinitis due to animal (cat) (dog) hair and dander: Secondary | ICD-10-CM | POA: Diagnosis not present

## 2018-06-28 DIAGNOSIS — J3089 Other allergic rhinitis: Secondary | ICD-10-CM | POA: Diagnosis not present

## 2018-06-28 DIAGNOSIS — J301 Allergic rhinitis due to pollen: Secondary | ICD-10-CM | POA: Diagnosis not present

## 2018-06-29 DIAGNOSIS — G4733 Obstructive sleep apnea (adult) (pediatric): Secondary | ICD-10-CM | POA: Diagnosis not present

## 2018-07-04 DIAGNOSIS — J301 Allergic rhinitis due to pollen: Secondary | ICD-10-CM | POA: Diagnosis not present

## 2018-07-04 DIAGNOSIS — J3081 Allergic rhinitis due to animal (cat) (dog) hair and dander: Secondary | ICD-10-CM | POA: Diagnosis not present

## 2018-07-04 DIAGNOSIS — J3089 Other allergic rhinitis: Secondary | ICD-10-CM | POA: Diagnosis not present

## 2018-07-11 DIAGNOSIS — J3081 Allergic rhinitis due to animal (cat) (dog) hair and dander: Secondary | ICD-10-CM | POA: Diagnosis not present

## 2018-07-11 DIAGNOSIS — J3089 Other allergic rhinitis: Secondary | ICD-10-CM | POA: Diagnosis not present

## 2018-07-11 DIAGNOSIS — J301 Allergic rhinitis due to pollen: Secondary | ICD-10-CM | POA: Diagnosis not present

## 2018-07-14 DIAGNOSIS — H2513 Age-related nuclear cataract, bilateral: Secondary | ICD-10-CM | POA: Diagnosis not present

## 2018-07-14 DIAGNOSIS — H401131 Primary open-angle glaucoma, bilateral, mild stage: Secondary | ICD-10-CM | POA: Diagnosis not present

## 2018-07-14 DIAGNOSIS — H5213 Myopia, bilateral: Secondary | ICD-10-CM | POA: Diagnosis not present

## 2018-07-19 DIAGNOSIS — J3089 Other allergic rhinitis: Secondary | ICD-10-CM | POA: Diagnosis not present

## 2018-07-19 DIAGNOSIS — J301 Allergic rhinitis due to pollen: Secondary | ICD-10-CM | POA: Diagnosis not present

## 2018-07-19 DIAGNOSIS — J3081 Allergic rhinitis due to animal (cat) (dog) hair and dander: Secondary | ICD-10-CM | POA: Diagnosis not present

## 2018-07-24 DIAGNOSIS — J301 Allergic rhinitis due to pollen: Secondary | ICD-10-CM | POA: Diagnosis not present

## 2018-07-24 DIAGNOSIS — J3089 Other allergic rhinitis: Secondary | ICD-10-CM | POA: Diagnosis not present

## 2018-07-24 DIAGNOSIS — J3081 Allergic rhinitis due to animal (cat) (dog) hair and dander: Secondary | ICD-10-CM | POA: Diagnosis not present

## 2018-08-02 DIAGNOSIS — J3089 Other allergic rhinitis: Secondary | ICD-10-CM | POA: Diagnosis not present

## 2018-08-02 DIAGNOSIS — J3081 Allergic rhinitis due to animal (cat) (dog) hair and dander: Secondary | ICD-10-CM | POA: Diagnosis not present

## 2018-08-02 DIAGNOSIS — J301 Allergic rhinitis due to pollen: Secondary | ICD-10-CM | POA: Diagnosis not present

## 2018-08-09 DIAGNOSIS — J3089 Other allergic rhinitis: Secondary | ICD-10-CM | POA: Diagnosis not present

## 2018-08-09 DIAGNOSIS — J3081 Allergic rhinitis due to animal (cat) (dog) hair and dander: Secondary | ICD-10-CM | POA: Diagnosis not present

## 2018-08-09 DIAGNOSIS — J301 Allergic rhinitis due to pollen: Secondary | ICD-10-CM | POA: Diagnosis not present

## 2018-08-15 DIAGNOSIS — H6121 Impacted cerumen, right ear: Secondary | ICD-10-CM | POA: Diagnosis not present

## 2018-08-23 DIAGNOSIS — J3089 Other allergic rhinitis: Secondary | ICD-10-CM | POA: Diagnosis not present

## 2018-08-23 DIAGNOSIS — J301 Allergic rhinitis due to pollen: Secondary | ICD-10-CM | POA: Diagnosis not present

## 2018-08-23 DIAGNOSIS — J3081 Allergic rhinitis due to animal (cat) (dog) hair and dander: Secondary | ICD-10-CM | POA: Diagnosis not present

## 2018-08-31 DIAGNOSIS — J3081 Allergic rhinitis due to animal (cat) (dog) hair and dander: Secondary | ICD-10-CM | POA: Diagnosis not present

## 2018-08-31 DIAGNOSIS — J301 Allergic rhinitis due to pollen: Secondary | ICD-10-CM | POA: Diagnosis not present

## 2018-08-31 DIAGNOSIS — J3089 Other allergic rhinitis: Secondary | ICD-10-CM | POA: Diagnosis not present

## 2018-09-07 DIAGNOSIS — J301 Allergic rhinitis due to pollen: Secondary | ICD-10-CM | POA: Diagnosis not present

## 2018-09-07 DIAGNOSIS — J3081 Allergic rhinitis due to animal (cat) (dog) hair and dander: Secondary | ICD-10-CM | POA: Diagnosis not present

## 2018-09-07 DIAGNOSIS — J3089 Other allergic rhinitis: Secondary | ICD-10-CM | POA: Diagnosis not present

## 2018-09-14 DIAGNOSIS — J3089 Other allergic rhinitis: Secondary | ICD-10-CM | POA: Diagnosis not present

## 2018-09-14 DIAGNOSIS — J301 Allergic rhinitis due to pollen: Secondary | ICD-10-CM | POA: Diagnosis not present

## 2018-09-23 DIAGNOSIS — K047 Periapical abscess without sinus: Secondary | ICD-10-CM | POA: Diagnosis not present

## 2018-10-02 DIAGNOSIS — G4733 Obstructive sleep apnea (adult) (pediatric): Secondary | ICD-10-CM | POA: Diagnosis not present

## 2018-10-05 DIAGNOSIS — J301 Allergic rhinitis due to pollen: Secondary | ICD-10-CM | POA: Diagnosis not present

## 2018-10-05 DIAGNOSIS — J3089 Other allergic rhinitis: Secondary | ICD-10-CM | POA: Diagnosis not present

## 2018-10-05 DIAGNOSIS — J3081 Allergic rhinitis due to animal (cat) (dog) hair and dander: Secondary | ICD-10-CM | POA: Diagnosis not present

## 2018-10-06 DIAGNOSIS — L814 Other melanin hyperpigmentation: Secondary | ICD-10-CM | POA: Diagnosis not present

## 2018-10-06 DIAGNOSIS — D225 Melanocytic nevi of trunk: Secondary | ICD-10-CM | POA: Diagnosis not present

## 2018-10-06 DIAGNOSIS — L570 Actinic keratosis: Secondary | ICD-10-CM | POA: Diagnosis not present

## 2018-10-06 DIAGNOSIS — D1801 Hemangioma of skin and subcutaneous tissue: Secondary | ICD-10-CM | POA: Diagnosis not present

## 2018-10-06 DIAGNOSIS — L821 Other seborrheic keratosis: Secondary | ICD-10-CM | POA: Diagnosis not present

## 2018-10-12 DIAGNOSIS — J3089 Other allergic rhinitis: Secondary | ICD-10-CM | POA: Diagnosis not present

## 2018-10-12 DIAGNOSIS — J3081 Allergic rhinitis due to animal (cat) (dog) hair and dander: Secondary | ICD-10-CM | POA: Diagnosis not present

## 2018-10-12 DIAGNOSIS — J301 Allergic rhinitis due to pollen: Secondary | ICD-10-CM | POA: Diagnosis not present

## 2018-10-17 DIAGNOSIS — J301 Allergic rhinitis due to pollen: Secondary | ICD-10-CM | POA: Diagnosis not present

## 2018-10-17 DIAGNOSIS — J3081 Allergic rhinitis due to animal (cat) (dog) hair and dander: Secondary | ICD-10-CM | POA: Diagnosis not present

## 2018-10-17 DIAGNOSIS — J3089 Other allergic rhinitis: Secondary | ICD-10-CM | POA: Diagnosis not present

## 2018-10-24 DIAGNOSIS — R69 Illness, unspecified: Secondary | ICD-10-CM | POA: Diagnosis not present

## 2018-10-25 DIAGNOSIS — J301 Allergic rhinitis due to pollen: Secondary | ICD-10-CM | POA: Diagnosis not present

## 2018-10-25 DIAGNOSIS — J3081 Allergic rhinitis due to animal (cat) (dog) hair and dander: Secondary | ICD-10-CM | POA: Diagnosis not present

## 2018-10-25 DIAGNOSIS — J3089 Other allergic rhinitis: Secondary | ICD-10-CM | POA: Diagnosis not present

## 2018-11-02 DIAGNOSIS — J3081 Allergic rhinitis due to animal (cat) (dog) hair and dander: Secondary | ICD-10-CM | POA: Diagnosis not present

## 2018-11-02 DIAGNOSIS — J301 Allergic rhinitis due to pollen: Secondary | ICD-10-CM | POA: Diagnosis not present

## 2018-11-02 DIAGNOSIS — J3089 Other allergic rhinitis: Secondary | ICD-10-CM | POA: Diagnosis not present

## 2018-11-02 DIAGNOSIS — G4733 Obstructive sleep apnea (adult) (pediatric): Secondary | ICD-10-CM | POA: Diagnosis not present

## 2018-11-08 DIAGNOSIS — J301 Allergic rhinitis due to pollen: Secondary | ICD-10-CM | POA: Diagnosis not present

## 2018-11-08 DIAGNOSIS — J3089 Other allergic rhinitis: Secondary | ICD-10-CM | POA: Diagnosis not present

## 2018-11-14 DIAGNOSIS — J3089 Other allergic rhinitis: Secondary | ICD-10-CM | POA: Diagnosis not present

## 2018-11-14 DIAGNOSIS — J301 Allergic rhinitis due to pollen: Secondary | ICD-10-CM | POA: Diagnosis not present

## 2018-11-14 DIAGNOSIS — J3081 Allergic rhinitis due to animal (cat) (dog) hair and dander: Secondary | ICD-10-CM | POA: Diagnosis not present

## 2018-11-15 DIAGNOSIS — J3089 Other allergic rhinitis: Secondary | ICD-10-CM | POA: Diagnosis not present

## 2018-11-15 DIAGNOSIS — J3081 Allergic rhinitis due to animal (cat) (dog) hair and dander: Secondary | ICD-10-CM | POA: Diagnosis not present

## 2018-11-15 DIAGNOSIS — J4520 Mild intermittent asthma, uncomplicated: Secondary | ICD-10-CM | POA: Diagnosis not present

## 2018-11-15 DIAGNOSIS — J301 Allergic rhinitis due to pollen: Secondary | ICD-10-CM | POA: Diagnosis not present

## 2018-11-17 DIAGNOSIS — J301 Allergic rhinitis due to pollen: Secondary | ICD-10-CM | POA: Diagnosis not present

## 2018-11-17 DIAGNOSIS — J3081 Allergic rhinitis due to animal (cat) (dog) hair and dander: Secondary | ICD-10-CM | POA: Diagnosis not present

## 2018-11-17 DIAGNOSIS — J3089 Other allergic rhinitis: Secondary | ICD-10-CM | POA: Diagnosis not present

## 2018-11-24 DIAGNOSIS — J3089 Other allergic rhinitis: Secondary | ICD-10-CM | POA: Diagnosis not present

## 2018-11-24 DIAGNOSIS — J3081 Allergic rhinitis due to animal (cat) (dog) hair and dander: Secondary | ICD-10-CM | POA: Diagnosis not present

## 2018-11-24 DIAGNOSIS — J301 Allergic rhinitis due to pollen: Secondary | ICD-10-CM | POA: Diagnosis not present

## 2018-11-29 DIAGNOSIS — J301 Allergic rhinitis due to pollen: Secondary | ICD-10-CM | POA: Diagnosis not present

## 2018-11-29 DIAGNOSIS — J3081 Allergic rhinitis due to animal (cat) (dog) hair and dander: Secondary | ICD-10-CM | POA: Diagnosis not present

## 2018-11-29 DIAGNOSIS — J3089 Other allergic rhinitis: Secondary | ICD-10-CM | POA: Diagnosis not present

## 2018-12-03 DIAGNOSIS — G4733 Obstructive sleep apnea (adult) (pediatric): Secondary | ICD-10-CM | POA: Diagnosis not present

## 2018-12-06 DIAGNOSIS — J3081 Allergic rhinitis due to animal (cat) (dog) hair and dander: Secondary | ICD-10-CM | POA: Diagnosis not present

## 2018-12-06 DIAGNOSIS — J301 Allergic rhinitis due to pollen: Secondary | ICD-10-CM | POA: Diagnosis not present

## 2018-12-06 DIAGNOSIS — J3089 Other allergic rhinitis: Secondary | ICD-10-CM | POA: Diagnosis not present

## 2018-12-14 DIAGNOSIS — J301 Allergic rhinitis due to pollen: Secondary | ICD-10-CM | POA: Diagnosis not present

## 2018-12-14 DIAGNOSIS — J3081 Allergic rhinitis due to animal (cat) (dog) hair and dander: Secondary | ICD-10-CM | POA: Diagnosis not present

## 2018-12-14 DIAGNOSIS — J3089 Other allergic rhinitis: Secondary | ICD-10-CM | POA: Diagnosis not present

## 2018-12-19 DIAGNOSIS — J301 Allergic rhinitis due to pollen: Secondary | ICD-10-CM | POA: Diagnosis not present

## 2018-12-19 DIAGNOSIS — J3081 Allergic rhinitis due to animal (cat) (dog) hair and dander: Secondary | ICD-10-CM | POA: Diagnosis not present

## 2018-12-19 DIAGNOSIS — J3089 Other allergic rhinitis: Secondary | ICD-10-CM | POA: Diagnosis not present

## 2018-12-22 DIAGNOSIS — J301 Allergic rhinitis due to pollen: Secondary | ICD-10-CM | POA: Diagnosis not present

## 2018-12-22 DIAGNOSIS — J3089 Other allergic rhinitis: Secondary | ICD-10-CM | POA: Diagnosis not present

## 2018-12-22 DIAGNOSIS — J3081 Allergic rhinitis due to animal (cat) (dog) hair and dander: Secondary | ICD-10-CM | POA: Diagnosis not present

## 2018-12-26 DIAGNOSIS — J301 Allergic rhinitis due to pollen: Secondary | ICD-10-CM | POA: Diagnosis not present

## 2018-12-26 DIAGNOSIS — J3089 Other allergic rhinitis: Secondary | ICD-10-CM | POA: Diagnosis not present

## 2018-12-29 DIAGNOSIS — J3089 Other allergic rhinitis: Secondary | ICD-10-CM | POA: Diagnosis not present

## 2018-12-29 DIAGNOSIS — J3081 Allergic rhinitis due to animal (cat) (dog) hair and dander: Secondary | ICD-10-CM | POA: Diagnosis not present

## 2018-12-29 DIAGNOSIS — J301 Allergic rhinitis due to pollen: Secondary | ICD-10-CM | POA: Diagnosis not present

## 2019-01-04 DIAGNOSIS — J301 Allergic rhinitis due to pollen: Secondary | ICD-10-CM | POA: Diagnosis not present

## 2019-01-04 DIAGNOSIS — J3081 Allergic rhinitis due to animal (cat) (dog) hair and dander: Secondary | ICD-10-CM | POA: Diagnosis not present

## 2019-01-04 DIAGNOSIS — J3089 Other allergic rhinitis: Secondary | ICD-10-CM | POA: Diagnosis not present

## 2019-01-18 DIAGNOSIS — J3089 Other allergic rhinitis: Secondary | ICD-10-CM | POA: Diagnosis not present

## 2019-01-18 DIAGNOSIS — J301 Allergic rhinitis due to pollen: Secondary | ICD-10-CM | POA: Diagnosis not present

## 2019-01-18 DIAGNOSIS — J3081 Allergic rhinitis due to animal (cat) (dog) hair and dander: Secondary | ICD-10-CM | POA: Diagnosis not present

## 2019-01-24 DIAGNOSIS — J3089 Other allergic rhinitis: Secondary | ICD-10-CM | POA: Diagnosis not present

## 2019-01-24 DIAGNOSIS — J301 Allergic rhinitis due to pollen: Secondary | ICD-10-CM | POA: Diagnosis not present

## 2019-01-24 DIAGNOSIS — J3081 Allergic rhinitis due to animal (cat) (dog) hair and dander: Secondary | ICD-10-CM | POA: Diagnosis not present

## 2019-01-24 DIAGNOSIS — G4733 Obstructive sleep apnea (adult) (pediatric): Secondary | ICD-10-CM | POA: Diagnosis not present

## 2019-01-30 DIAGNOSIS — J3089 Other allergic rhinitis: Secondary | ICD-10-CM | POA: Diagnosis not present

## 2019-01-30 DIAGNOSIS — J3081 Allergic rhinitis due to animal (cat) (dog) hair and dander: Secondary | ICD-10-CM | POA: Diagnosis not present

## 2019-01-30 DIAGNOSIS — J301 Allergic rhinitis due to pollen: Secondary | ICD-10-CM | POA: Diagnosis not present

## 2019-02-05 DIAGNOSIS — H5213 Myopia, bilateral: Secondary | ICD-10-CM | POA: Diagnosis not present

## 2019-02-05 DIAGNOSIS — H401131 Primary open-angle glaucoma, bilateral, mild stage: Secondary | ICD-10-CM | POA: Diagnosis not present

## 2019-02-05 DIAGNOSIS — H2513 Age-related nuclear cataract, bilateral: Secondary | ICD-10-CM | POA: Diagnosis not present

## 2019-02-07 DIAGNOSIS — J3081 Allergic rhinitis due to animal (cat) (dog) hair and dander: Secondary | ICD-10-CM | POA: Diagnosis not present

## 2019-02-07 DIAGNOSIS — J301 Allergic rhinitis due to pollen: Secondary | ICD-10-CM | POA: Diagnosis not present

## 2019-02-07 DIAGNOSIS — J3089 Other allergic rhinitis: Secondary | ICD-10-CM | POA: Diagnosis not present

## 2019-02-16 DIAGNOSIS — J301 Allergic rhinitis due to pollen: Secondary | ICD-10-CM | POA: Diagnosis not present

## 2019-02-16 DIAGNOSIS — J3089 Other allergic rhinitis: Secondary | ICD-10-CM | POA: Diagnosis not present

## 2019-02-16 DIAGNOSIS — J3081 Allergic rhinitis due to animal (cat) (dog) hair and dander: Secondary | ICD-10-CM | POA: Diagnosis not present

## 2019-02-19 DIAGNOSIS — J3089 Other allergic rhinitis: Secondary | ICD-10-CM | POA: Diagnosis not present

## 2019-02-19 DIAGNOSIS — J3081 Allergic rhinitis due to animal (cat) (dog) hair and dander: Secondary | ICD-10-CM | POA: Diagnosis not present

## 2019-02-19 DIAGNOSIS — J301 Allergic rhinitis due to pollen: Secondary | ICD-10-CM | POA: Diagnosis not present

## 2019-02-23 DIAGNOSIS — G4733 Obstructive sleep apnea (adult) (pediatric): Secondary | ICD-10-CM | POA: Diagnosis not present

## 2019-02-28 DIAGNOSIS — J301 Allergic rhinitis due to pollen: Secondary | ICD-10-CM | POA: Diagnosis not present

## 2019-02-28 DIAGNOSIS — J3081 Allergic rhinitis due to animal (cat) (dog) hair and dander: Secondary | ICD-10-CM | POA: Diagnosis not present

## 2019-02-28 DIAGNOSIS — J3089 Other allergic rhinitis: Secondary | ICD-10-CM | POA: Diagnosis not present

## 2019-03-07 DIAGNOSIS — J3089 Other allergic rhinitis: Secondary | ICD-10-CM | POA: Diagnosis not present

## 2019-03-07 DIAGNOSIS — J301 Allergic rhinitis due to pollen: Secondary | ICD-10-CM | POA: Diagnosis not present

## 2019-03-07 DIAGNOSIS — J3081 Allergic rhinitis due to animal (cat) (dog) hair and dander: Secondary | ICD-10-CM | POA: Diagnosis not present

## 2019-03-16 DIAGNOSIS — J301 Allergic rhinitis due to pollen: Secondary | ICD-10-CM | POA: Diagnosis not present

## 2019-03-16 DIAGNOSIS — J3089 Other allergic rhinitis: Secondary | ICD-10-CM | POA: Diagnosis not present

## 2019-03-16 DIAGNOSIS — J3081 Allergic rhinitis due to animal (cat) (dog) hair and dander: Secondary | ICD-10-CM | POA: Diagnosis not present

## 2019-03-21 DIAGNOSIS — J301 Allergic rhinitis due to pollen: Secondary | ICD-10-CM | POA: Diagnosis not present

## 2019-03-21 DIAGNOSIS — J3089 Other allergic rhinitis: Secondary | ICD-10-CM | POA: Diagnosis not present

## 2019-03-21 DIAGNOSIS — J3081 Allergic rhinitis due to animal (cat) (dog) hair and dander: Secondary | ICD-10-CM | POA: Diagnosis not present

## 2019-03-26 DIAGNOSIS — G4733 Obstructive sleep apnea (adult) (pediatric): Secondary | ICD-10-CM | POA: Diagnosis not present

## 2019-03-28 DIAGNOSIS — J3081 Allergic rhinitis due to animal (cat) (dog) hair and dander: Secondary | ICD-10-CM | POA: Diagnosis not present

## 2019-03-28 DIAGNOSIS — J301 Allergic rhinitis due to pollen: Secondary | ICD-10-CM | POA: Diagnosis not present

## 2019-03-28 DIAGNOSIS — J3089 Other allergic rhinitis: Secondary | ICD-10-CM | POA: Diagnosis not present

## 2019-04-05 DIAGNOSIS — J3089 Other allergic rhinitis: Secondary | ICD-10-CM | POA: Diagnosis not present

## 2019-04-05 DIAGNOSIS — J3081 Allergic rhinitis due to animal (cat) (dog) hair and dander: Secondary | ICD-10-CM | POA: Diagnosis not present

## 2019-04-05 DIAGNOSIS — J301 Allergic rhinitis due to pollen: Secondary | ICD-10-CM | POA: Diagnosis not present

## 2019-04-09 DIAGNOSIS — J301 Allergic rhinitis due to pollen: Secondary | ICD-10-CM | POA: Diagnosis not present

## 2019-04-09 DIAGNOSIS — J3081 Allergic rhinitis due to animal (cat) (dog) hair and dander: Secondary | ICD-10-CM | POA: Diagnosis not present

## 2019-04-09 DIAGNOSIS — J3089 Other allergic rhinitis: Secondary | ICD-10-CM | POA: Diagnosis not present

## 2019-04-11 DIAGNOSIS — J3081 Allergic rhinitis due to animal (cat) (dog) hair and dander: Secondary | ICD-10-CM | POA: Diagnosis not present

## 2019-04-11 DIAGNOSIS — J3089 Other allergic rhinitis: Secondary | ICD-10-CM | POA: Diagnosis not present

## 2019-04-11 DIAGNOSIS — J301 Allergic rhinitis due to pollen: Secondary | ICD-10-CM | POA: Diagnosis not present

## 2019-04-24 DIAGNOSIS — J301 Allergic rhinitis due to pollen: Secondary | ICD-10-CM | POA: Diagnosis not present

## 2019-04-24 DIAGNOSIS — J3081 Allergic rhinitis due to animal (cat) (dog) hair and dander: Secondary | ICD-10-CM | POA: Diagnosis not present

## 2019-04-24 DIAGNOSIS — J3089 Other allergic rhinitis: Secondary | ICD-10-CM | POA: Diagnosis not present

## 2019-04-25 DIAGNOSIS — Z79899 Other long term (current) drug therapy: Secondary | ICD-10-CM | POA: Diagnosis not present

## 2019-04-25 DIAGNOSIS — Z0000 Encounter for general adult medical examination without abnormal findings: Secondary | ICD-10-CM | POA: Diagnosis not present

## 2019-04-25 DIAGNOSIS — R7309 Other abnormal glucose: Secondary | ICD-10-CM | POA: Diagnosis not present

## 2019-04-25 DIAGNOSIS — G4733 Obstructive sleep apnea (adult) (pediatric): Secondary | ICD-10-CM | POA: Diagnosis not present

## 2019-04-25 DIAGNOSIS — Z125 Encounter for screening for malignant neoplasm of prostate: Secondary | ICD-10-CM | POA: Diagnosis not present

## 2019-04-25 DIAGNOSIS — R69 Illness, unspecified: Secondary | ICD-10-CM | POA: Diagnosis not present

## 2019-04-25 DIAGNOSIS — M2550 Pain in unspecified joint: Secondary | ICD-10-CM | POA: Diagnosis not present

## 2019-05-02 DIAGNOSIS — J3081 Allergic rhinitis due to animal (cat) (dog) hair and dander: Secondary | ICD-10-CM | POA: Diagnosis not present

## 2019-05-02 DIAGNOSIS — J3089 Other allergic rhinitis: Secondary | ICD-10-CM | POA: Diagnosis not present

## 2019-05-02 DIAGNOSIS — J301 Allergic rhinitis due to pollen: Secondary | ICD-10-CM | POA: Diagnosis not present

## 2019-05-09 DIAGNOSIS — J3089 Other allergic rhinitis: Secondary | ICD-10-CM | POA: Diagnosis not present

## 2019-05-09 DIAGNOSIS — J3081 Allergic rhinitis due to animal (cat) (dog) hair and dander: Secondary | ICD-10-CM | POA: Diagnosis not present

## 2019-05-09 DIAGNOSIS — J301 Allergic rhinitis due to pollen: Secondary | ICD-10-CM | POA: Diagnosis not present

## 2019-05-09 DIAGNOSIS — R69 Illness, unspecified: Secondary | ICD-10-CM | POA: Diagnosis not present

## 2019-05-11 DIAGNOSIS — J45909 Unspecified asthma, uncomplicated: Secondary | ICD-10-CM | POA: Diagnosis not present

## 2019-05-11 DIAGNOSIS — G4733 Obstructive sleep apnea (adult) (pediatric): Secondary | ICD-10-CM | POA: Diagnosis not present

## 2019-05-11 DIAGNOSIS — Z0000 Encounter for general adult medical examination without abnormal findings: Secondary | ICD-10-CM | POA: Diagnosis not present

## 2019-05-11 DIAGNOSIS — G43909 Migraine, unspecified, not intractable, without status migrainosus: Secondary | ICD-10-CM | POA: Diagnosis not present

## 2019-05-11 DIAGNOSIS — I10 Essential (primary) hypertension: Secondary | ICD-10-CM | POA: Diagnosis not present

## 2019-05-11 DIAGNOSIS — R7401 Elevation of levels of liver transaminase levels: Secondary | ICD-10-CM | POA: Diagnosis not present

## 2019-05-11 DIAGNOSIS — R82998 Other abnormal findings in urine: Secondary | ICD-10-CM | POA: Diagnosis not present

## 2019-05-11 DIAGNOSIS — K219 Gastro-esophageal reflux disease without esophagitis: Secondary | ICD-10-CM | POA: Diagnosis not present

## 2019-05-11 DIAGNOSIS — M109 Gout, unspecified: Secondary | ICD-10-CM | POA: Diagnosis not present

## 2019-05-11 DIAGNOSIS — R7301 Impaired fasting glucose: Secondary | ICD-10-CM | POA: Diagnosis not present

## 2019-05-11 DIAGNOSIS — R69 Illness, unspecified: Secondary | ICD-10-CM | POA: Diagnosis not present

## 2019-05-11 DIAGNOSIS — Z86010 Personal history of colonic polyps: Secondary | ICD-10-CM | POA: Diagnosis not present

## 2019-05-21 DIAGNOSIS — J3081 Allergic rhinitis due to animal (cat) (dog) hair and dander: Secondary | ICD-10-CM | POA: Diagnosis not present

## 2019-05-21 DIAGNOSIS — J3089 Other allergic rhinitis: Secondary | ICD-10-CM | POA: Diagnosis not present

## 2019-05-21 DIAGNOSIS — J301 Allergic rhinitis due to pollen: Secondary | ICD-10-CM | POA: Diagnosis not present

## 2019-05-23 DIAGNOSIS — G4733 Obstructive sleep apnea (adult) (pediatric): Secondary | ICD-10-CM | POA: Diagnosis not present

## 2019-05-23 DIAGNOSIS — R69 Illness, unspecified: Secondary | ICD-10-CM | POA: Diagnosis not present

## 2019-05-29 DIAGNOSIS — J301 Allergic rhinitis due to pollen: Secondary | ICD-10-CM | POA: Diagnosis not present

## 2019-05-29 DIAGNOSIS — J3081 Allergic rhinitis due to animal (cat) (dog) hair and dander: Secondary | ICD-10-CM | POA: Diagnosis not present

## 2019-05-29 DIAGNOSIS — J3089 Other allergic rhinitis: Secondary | ICD-10-CM | POA: Diagnosis not present

## 2019-06-04 DIAGNOSIS — J3089 Other allergic rhinitis: Secondary | ICD-10-CM | POA: Diagnosis not present

## 2019-06-04 DIAGNOSIS — J3081 Allergic rhinitis due to animal (cat) (dog) hair and dander: Secondary | ICD-10-CM | POA: Diagnosis not present

## 2019-06-04 DIAGNOSIS — J301 Allergic rhinitis due to pollen: Secondary | ICD-10-CM | POA: Diagnosis not present

## 2019-06-06 DIAGNOSIS — J3081 Allergic rhinitis due to animal (cat) (dog) hair and dander: Secondary | ICD-10-CM | POA: Diagnosis not present

## 2019-06-06 DIAGNOSIS — J301 Allergic rhinitis due to pollen: Secondary | ICD-10-CM | POA: Diagnosis not present

## 2019-06-06 DIAGNOSIS — J3089 Other allergic rhinitis: Secondary | ICD-10-CM | POA: Diagnosis not present

## 2019-06-13 DIAGNOSIS — J301 Allergic rhinitis due to pollen: Secondary | ICD-10-CM | POA: Diagnosis not present

## 2019-06-13 DIAGNOSIS — J3081 Allergic rhinitis due to animal (cat) (dog) hair and dander: Secondary | ICD-10-CM | POA: Diagnosis not present

## 2019-06-13 DIAGNOSIS — J3089 Other allergic rhinitis: Secondary | ICD-10-CM | POA: Diagnosis not present

## 2019-06-19 DIAGNOSIS — J3089 Other allergic rhinitis: Secondary | ICD-10-CM | POA: Diagnosis not present

## 2019-06-19 DIAGNOSIS — J301 Allergic rhinitis due to pollen: Secondary | ICD-10-CM | POA: Diagnosis not present

## 2019-06-19 DIAGNOSIS — J3081 Allergic rhinitis due to animal (cat) (dog) hair and dander: Secondary | ICD-10-CM | POA: Diagnosis not present

## 2019-06-23 DIAGNOSIS — G4733 Obstructive sleep apnea (adult) (pediatric): Secondary | ICD-10-CM | POA: Diagnosis not present

## 2019-06-27 DIAGNOSIS — J301 Allergic rhinitis due to pollen: Secondary | ICD-10-CM | POA: Diagnosis not present

## 2019-06-27 DIAGNOSIS — J3089 Other allergic rhinitis: Secondary | ICD-10-CM | POA: Diagnosis not present

## 2019-06-27 DIAGNOSIS — J3081 Allergic rhinitis due to animal (cat) (dog) hair and dander: Secondary | ICD-10-CM | POA: Diagnosis not present

## 2019-07-02 DIAGNOSIS — J3081 Allergic rhinitis due to animal (cat) (dog) hair and dander: Secondary | ICD-10-CM | POA: Diagnosis not present

## 2019-07-02 DIAGNOSIS — J301 Allergic rhinitis due to pollen: Secondary | ICD-10-CM | POA: Diagnosis not present

## 2019-07-02 DIAGNOSIS — J3089 Other allergic rhinitis: Secondary | ICD-10-CM | POA: Diagnosis not present

## 2019-07-11 DIAGNOSIS — J3081 Allergic rhinitis due to animal (cat) (dog) hair and dander: Secondary | ICD-10-CM | POA: Diagnosis not present

## 2019-07-11 DIAGNOSIS — J301 Allergic rhinitis due to pollen: Secondary | ICD-10-CM | POA: Diagnosis not present

## 2019-07-11 DIAGNOSIS — J3089 Other allergic rhinitis: Secondary | ICD-10-CM | POA: Diagnosis not present

## 2019-07-18 DIAGNOSIS — J301 Allergic rhinitis due to pollen: Secondary | ICD-10-CM | POA: Diagnosis not present

## 2019-07-18 DIAGNOSIS — J3081 Allergic rhinitis due to animal (cat) (dog) hair and dander: Secondary | ICD-10-CM | POA: Diagnosis not present

## 2019-07-18 DIAGNOSIS — J3089 Other allergic rhinitis: Secondary | ICD-10-CM | POA: Diagnosis not present

## 2019-07-23 DIAGNOSIS — J3081 Allergic rhinitis due to animal (cat) (dog) hair and dander: Secondary | ICD-10-CM | POA: Diagnosis not present

## 2019-07-23 DIAGNOSIS — J3089 Other allergic rhinitis: Secondary | ICD-10-CM | POA: Diagnosis not present

## 2019-07-23 DIAGNOSIS — J301 Allergic rhinitis due to pollen: Secondary | ICD-10-CM | POA: Diagnosis not present

## 2019-07-24 DIAGNOSIS — G4733 Obstructive sleep apnea (adult) (pediatric): Secondary | ICD-10-CM | POA: Diagnosis not present

## 2019-08-01 DIAGNOSIS — J3089 Other allergic rhinitis: Secondary | ICD-10-CM | POA: Diagnosis not present

## 2019-08-01 DIAGNOSIS — J301 Allergic rhinitis due to pollen: Secondary | ICD-10-CM | POA: Diagnosis not present

## 2019-08-01 DIAGNOSIS — J3081 Allergic rhinitis due to animal (cat) (dog) hair and dander: Secondary | ICD-10-CM | POA: Diagnosis not present

## 2019-08-07 DIAGNOSIS — H5213 Myopia, bilateral: Secondary | ICD-10-CM | POA: Diagnosis not present

## 2019-08-07 DIAGNOSIS — H2513 Age-related nuclear cataract, bilateral: Secondary | ICD-10-CM | POA: Diagnosis not present

## 2019-08-07 DIAGNOSIS — H401131 Primary open-angle glaucoma, bilateral, mild stage: Secondary | ICD-10-CM | POA: Diagnosis not present

## 2019-08-09 DIAGNOSIS — J3081 Allergic rhinitis due to animal (cat) (dog) hair and dander: Secondary | ICD-10-CM | POA: Diagnosis not present

## 2019-08-09 DIAGNOSIS — J301 Allergic rhinitis due to pollen: Secondary | ICD-10-CM | POA: Diagnosis not present

## 2019-08-09 DIAGNOSIS — J3089 Other allergic rhinitis: Secondary | ICD-10-CM | POA: Diagnosis not present

## 2019-08-17 DIAGNOSIS — J301 Allergic rhinitis due to pollen: Secondary | ICD-10-CM | POA: Diagnosis not present

## 2019-08-17 DIAGNOSIS — J3089 Other allergic rhinitis: Secondary | ICD-10-CM | POA: Diagnosis not present

## 2019-08-24 DIAGNOSIS — J3081 Allergic rhinitis due to animal (cat) (dog) hair and dander: Secondary | ICD-10-CM | POA: Diagnosis not present

## 2019-08-24 DIAGNOSIS — J301 Allergic rhinitis due to pollen: Secondary | ICD-10-CM | POA: Diagnosis not present

## 2019-08-24 DIAGNOSIS — J3089 Other allergic rhinitis: Secondary | ICD-10-CM | POA: Diagnosis not present

## 2019-08-24 DIAGNOSIS — G4733 Obstructive sleep apnea (adult) (pediatric): Secondary | ICD-10-CM | POA: Diagnosis not present

## 2019-08-29 DIAGNOSIS — J3089 Other allergic rhinitis: Secondary | ICD-10-CM | POA: Diagnosis not present

## 2019-08-29 DIAGNOSIS — J301 Allergic rhinitis due to pollen: Secondary | ICD-10-CM | POA: Diagnosis not present

## 2019-08-29 DIAGNOSIS — J3081 Allergic rhinitis due to animal (cat) (dog) hair and dander: Secondary | ICD-10-CM | POA: Diagnosis not present

## 2019-09-07 DIAGNOSIS — J301 Allergic rhinitis due to pollen: Secondary | ICD-10-CM | POA: Diagnosis not present

## 2019-09-07 DIAGNOSIS — J3089 Other allergic rhinitis: Secondary | ICD-10-CM | POA: Diagnosis not present

## 2019-09-07 DIAGNOSIS — J3081 Allergic rhinitis due to animal (cat) (dog) hair and dander: Secondary | ICD-10-CM | POA: Diagnosis not present

## 2019-09-14 DIAGNOSIS — J301 Allergic rhinitis due to pollen: Secondary | ICD-10-CM | POA: Diagnosis not present

## 2019-09-14 DIAGNOSIS — J3089 Other allergic rhinitis: Secondary | ICD-10-CM | POA: Diagnosis not present

## 2019-09-14 DIAGNOSIS — J3081 Allergic rhinitis due to animal (cat) (dog) hair and dander: Secondary | ICD-10-CM | POA: Diagnosis not present

## 2019-09-19 DIAGNOSIS — J301 Allergic rhinitis due to pollen: Secondary | ICD-10-CM | POA: Diagnosis not present

## 2019-09-19 DIAGNOSIS — J3081 Allergic rhinitis due to animal (cat) (dog) hair and dander: Secondary | ICD-10-CM | POA: Diagnosis not present

## 2019-09-19 DIAGNOSIS — J3089 Other allergic rhinitis: Secondary | ICD-10-CM | POA: Diagnosis not present

## 2019-09-23 DIAGNOSIS — G4733 Obstructive sleep apnea (adult) (pediatric): Secondary | ICD-10-CM | POA: Diagnosis not present

## 2019-09-26 DIAGNOSIS — J3081 Allergic rhinitis due to animal (cat) (dog) hair and dander: Secondary | ICD-10-CM | POA: Diagnosis not present

## 2019-09-26 DIAGNOSIS — J301 Allergic rhinitis due to pollen: Secondary | ICD-10-CM | POA: Diagnosis not present

## 2019-09-26 DIAGNOSIS — J3089 Other allergic rhinitis: Secondary | ICD-10-CM | POA: Diagnosis not present

## 2019-10-02 DIAGNOSIS — J3081 Allergic rhinitis due to animal (cat) (dog) hair and dander: Secondary | ICD-10-CM | POA: Diagnosis not present

## 2019-10-02 DIAGNOSIS — J3089 Other allergic rhinitis: Secondary | ICD-10-CM | POA: Diagnosis not present

## 2019-10-02 DIAGNOSIS — J301 Allergic rhinitis due to pollen: Secondary | ICD-10-CM | POA: Diagnosis not present

## 2019-10-08 DIAGNOSIS — J3089 Other allergic rhinitis: Secondary | ICD-10-CM | POA: Diagnosis not present

## 2019-10-08 DIAGNOSIS — J3081 Allergic rhinitis due to animal (cat) (dog) hair and dander: Secondary | ICD-10-CM | POA: Diagnosis not present

## 2019-10-08 DIAGNOSIS — J301 Allergic rhinitis due to pollen: Secondary | ICD-10-CM | POA: Diagnosis not present

## 2019-10-10 DIAGNOSIS — D224 Melanocytic nevi of scalp and neck: Secondary | ICD-10-CM | POA: Diagnosis not present

## 2019-10-10 DIAGNOSIS — L821 Other seborrheic keratosis: Secondary | ICD-10-CM | POA: Diagnosis not present

## 2019-10-10 DIAGNOSIS — D225 Melanocytic nevi of trunk: Secondary | ICD-10-CM | POA: Diagnosis not present

## 2019-10-10 DIAGNOSIS — L570 Actinic keratosis: Secondary | ICD-10-CM | POA: Diagnosis not present

## 2019-10-10 DIAGNOSIS — D2262 Melanocytic nevi of left upper limb, including shoulder: Secondary | ICD-10-CM | POA: Diagnosis not present

## 2019-10-10 DIAGNOSIS — D2371 Other benign neoplasm of skin of right lower limb, including hip: Secondary | ICD-10-CM | POA: Diagnosis not present

## 2019-10-10 DIAGNOSIS — L430 Hypertrophic lichen planus: Secondary | ICD-10-CM | POA: Diagnosis not present

## 2019-10-10 DIAGNOSIS — D0462 Carcinoma in situ of skin of left upper limb, including shoulder: Secondary | ICD-10-CM | POA: Diagnosis not present

## 2019-10-10 DIAGNOSIS — L814 Other melanin hyperpigmentation: Secondary | ICD-10-CM | POA: Diagnosis not present

## 2019-10-10 DIAGNOSIS — D485 Neoplasm of uncertain behavior of skin: Secondary | ICD-10-CM | POA: Diagnosis not present

## 2019-10-15 DIAGNOSIS — J301 Allergic rhinitis due to pollen: Secondary | ICD-10-CM | POA: Diagnosis not present

## 2019-10-15 DIAGNOSIS — J3081 Allergic rhinitis due to animal (cat) (dog) hair and dander: Secondary | ICD-10-CM | POA: Diagnosis not present

## 2019-10-25 DIAGNOSIS — J3081 Allergic rhinitis due to animal (cat) (dog) hair and dander: Secondary | ICD-10-CM | POA: Diagnosis not present

## 2019-10-25 DIAGNOSIS — J3089 Other allergic rhinitis: Secondary | ICD-10-CM | POA: Diagnosis not present

## 2019-10-25 DIAGNOSIS — J301 Allergic rhinitis due to pollen: Secondary | ICD-10-CM | POA: Diagnosis not present

## 2019-10-30 DIAGNOSIS — G4733 Obstructive sleep apnea (adult) (pediatric): Secondary | ICD-10-CM | POA: Diagnosis not present

## 2019-10-31 DIAGNOSIS — J3089 Other allergic rhinitis: Secondary | ICD-10-CM | POA: Diagnosis not present

## 2019-10-31 DIAGNOSIS — J3081 Allergic rhinitis due to animal (cat) (dog) hair and dander: Secondary | ICD-10-CM | POA: Diagnosis not present

## 2019-10-31 DIAGNOSIS — J301 Allergic rhinitis due to pollen: Secondary | ICD-10-CM | POA: Diagnosis not present

## 2019-11-07 DIAGNOSIS — J3081 Allergic rhinitis due to animal (cat) (dog) hair and dander: Secondary | ICD-10-CM | POA: Diagnosis not present

## 2019-11-07 DIAGNOSIS — J3089 Other allergic rhinitis: Secondary | ICD-10-CM | POA: Diagnosis not present

## 2019-11-07 DIAGNOSIS — J301 Allergic rhinitis due to pollen: Secondary | ICD-10-CM | POA: Diagnosis not present

## 2019-11-13 DIAGNOSIS — J3089 Other allergic rhinitis: Secondary | ICD-10-CM | POA: Diagnosis not present

## 2019-11-13 DIAGNOSIS — J3081 Allergic rhinitis due to animal (cat) (dog) hair and dander: Secondary | ICD-10-CM | POA: Diagnosis not present

## 2019-11-13 DIAGNOSIS — J301 Allergic rhinitis due to pollen: Secondary | ICD-10-CM | POA: Diagnosis not present

## 2019-11-15 DIAGNOSIS — J3081 Allergic rhinitis due to animal (cat) (dog) hair and dander: Secondary | ICD-10-CM | POA: Diagnosis not present

## 2019-11-15 DIAGNOSIS — J3089 Other allergic rhinitis: Secondary | ICD-10-CM | POA: Diagnosis not present

## 2019-11-15 DIAGNOSIS — J301 Allergic rhinitis due to pollen: Secondary | ICD-10-CM | POA: Diagnosis not present

## 2019-11-15 DIAGNOSIS — J4520 Mild intermittent asthma, uncomplicated: Secondary | ICD-10-CM | POA: Diagnosis not present

## 2019-11-19 DIAGNOSIS — J3089 Other allergic rhinitis: Secondary | ICD-10-CM | POA: Diagnosis not present

## 2019-11-19 DIAGNOSIS — J301 Allergic rhinitis due to pollen: Secondary | ICD-10-CM | POA: Diagnosis not present

## 2019-11-19 DIAGNOSIS — J3081 Allergic rhinitis due to animal (cat) (dog) hair and dander: Secondary | ICD-10-CM | POA: Diagnosis not present

## 2019-11-26 DIAGNOSIS — Z23 Encounter for immunization: Secondary | ICD-10-CM | POA: Diagnosis not present

## 2019-11-26 DIAGNOSIS — R739 Hyperglycemia, unspecified: Secondary | ICD-10-CM | POA: Diagnosis not present

## 2019-11-26 DIAGNOSIS — Z8249 Family history of ischemic heart disease and other diseases of the circulatory system: Secondary | ICD-10-CM | POA: Diagnosis not present

## 2019-11-27 DIAGNOSIS — J3089 Other allergic rhinitis: Secondary | ICD-10-CM | POA: Diagnosis not present

## 2019-11-27 DIAGNOSIS — J301 Allergic rhinitis due to pollen: Secondary | ICD-10-CM | POA: Diagnosis not present

## 2019-11-27 DIAGNOSIS — J3081 Allergic rhinitis due to animal (cat) (dog) hair and dander: Secondary | ICD-10-CM | POA: Diagnosis not present

## 2019-11-29 ENCOUNTER — Other Ambulatory Visit: Payer: Self-pay | Admitting: Internal Medicine

## 2019-11-29 DIAGNOSIS — E785 Hyperlipidemia, unspecified: Secondary | ICD-10-CM

## 2019-11-29 DIAGNOSIS — Z8249 Family history of ischemic heart disease and other diseases of the circulatory system: Secondary | ICD-10-CM

## 2019-11-30 DIAGNOSIS — G4733 Obstructive sleep apnea (adult) (pediatric): Secondary | ICD-10-CM | POA: Diagnosis not present

## 2019-12-06 DIAGNOSIS — J3089 Other allergic rhinitis: Secondary | ICD-10-CM | POA: Diagnosis not present

## 2019-12-06 DIAGNOSIS — J3081 Allergic rhinitis due to animal (cat) (dog) hair and dander: Secondary | ICD-10-CM | POA: Diagnosis not present

## 2019-12-12 DIAGNOSIS — J3089 Other allergic rhinitis: Secondary | ICD-10-CM | POA: Diagnosis not present

## 2019-12-12 DIAGNOSIS — J3081 Allergic rhinitis due to animal (cat) (dog) hair and dander: Secondary | ICD-10-CM | POA: Diagnosis not present

## 2019-12-12 DIAGNOSIS — J301 Allergic rhinitis due to pollen: Secondary | ICD-10-CM | POA: Diagnosis not present

## 2019-12-13 ENCOUNTER — Other Ambulatory Visit: Payer: Self-pay

## 2019-12-13 ENCOUNTER — Ambulatory Visit
Admission: RE | Admit: 2019-12-13 | Discharge: 2019-12-13 | Disposition: A | Discharge status: Home / Self Care | Payer: No Typology Code available for payment source | Source: Ambulatory Visit | Attending: Internal Medicine | Admitting: Internal Medicine

## 2019-12-13 DIAGNOSIS — Z8249 Family history of ischemic heart disease and other diseases of the circulatory system: Secondary | ICD-10-CM

## 2019-12-13 DIAGNOSIS — E785 Hyperlipidemia, unspecified: Secondary | ICD-10-CM

## 2019-12-18 DIAGNOSIS — H5213 Myopia, bilateral: Secondary | ICD-10-CM | POA: Diagnosis not present

## 2019-12-18 DIAGNOSIS — H2513 Age-related nuclear cataract, bilateral: Secondary | ICD-10-CM | POA: Diagnosis not present

## 2019-12-18 DIAGNOSIS — H401131 Primary open-angle glaucoma, bilateral, mild stage: Secondary | ICD-10-CM | POA: Diagnosis not present

## 2019-12-20 ENCOUNTER — Encounter (INDEPENDENT_AMBULATORY_CARE_PROVIDER_SITE_OTHER): Payer: Self-pay | Admitting: Family Medicine

## 2019-12-20 ENCOUNTER — Other Ambulatory Visit: Payer: Self-pay

## 2019-12-20 ENCOUNTER — Ambulatory Visit (INDEPENDENT_AMBULATORY_CARE_PROVIDER_SITE_OTHER): Payer: Medicare HMO | Admitting: Family Medicine

## 2019-12-20 VITALS — BP 124/67 | HR 67 | Temp 98.0°F | Ht 69.0 in | Wt 248.0 lb

## 2019-12-20 DIAGNOSIS — J301 Allergic rhinitis due to pollen: Secondary | ICD-10-CM | POA: Diagnosis not present

## 2019-12-20 DIAGNOSIS — Z1331 Encounter for screening for depression: Secondary | ICD-10-CM

## 2019-12-20 DIAGNOSIS — R739 Hyperglycemia, unspecified: Secondary | ICD-10-CM | POA: Diagnosis not present

## 2019-12-20 DIAGNOSIS — J3089 Other allergic rhinitis: Secondary | ICD-10-CM | POA: Diagnosis not present

## 2019-12-20 DIAGNOSIS — R0602 Shortness of breath: Secondary | ICD-10-CM

## 2019-12-20 DIAGNOSIS — Z0289 Encounter for other administrative examinations: Secondary | ICD-10-CM

## 2019-12-20 DIAGNOSIS — E559 Vitamin D deficiency, unspecified: Secondary | ICD-10-CM | POA: Diagnosis not present

## 2019-12-20 DIAGNOSIS — R9431 Abnormal electrocardiogram [ECG] [EKG]: Secondary | ICD-10-CM

## 2019-12-20 DIAGNOSIS — Z6836 Body mass index (BMI) 36.0-36.9, adult: Secondary | ICD-10-CM

## 2019-12-20 DIAGNOSIS — I10 Essential (primary) hypertension: Secondary | ICD-10-CM | POA: Diagnosis not present

## 2019-12-20 DIAGNOSIS — R5383 Other fatigue: Secondary | ICD-10-CM

## 2019-12-20 DIAGNOSIS — J3081 Allergic rhinitis due to animal (cat) (dog) hair and dander: Secondary | ICD-10-CM | POA: Diagnosis not present

## 2019-12-21 LAB — LIPID PANEL WITH LDL/HDL RATIO
Cholesterol, Total: 142 mg/dL (ref 100–199)
HDL: 48 mg/dL (ref >39)
LDL Chol Calc (NIH): 80 mg/dL (ref 0–99)
LDL/HDL Ratio: 1.7 ratio (ref 0.0–3.6)
Triglycerides: 72 mg/dL (ref 0–149)
VLDL Cholesterol Cal: 14 mg/dL (ref 5–40)

## 2019-12-21 LAB — CBC WITH DIFFERENTIAL/PLATELET
Basophils Absolute: 0 10*3/uL (ref 0.0–0.2)
Basos: 0 %
EOS (ABSOLUTE): 0.1 10*3/uL (ref 0.0–0.4)
Eos: 2 %
Hematocrit: 49.4 % (ref 37.5–51.0)
Hemoglobin: 16.6 g/dL (ref 13.0–17.7)
Immature Grans (Abs): 0 10*3/uL (ref 0.0–0.1)
Immature Granulocytes: 0 %
Lymphocytes Absolute: 1.5 10*3/uL (ref 0.7–3.1)
Lymphs: 25 %
MCH: 32.2 pg (ref 26.6–33.0)
MCHC: 33.6 g/dL (ref 31.5–35.7)
MCV: 96 fL (ref 79–97)
Monocytes Absolute: 0.8 10*3/uL (ref 0.1–0.9)
Monocytes: 14 %
Neutrophils Absolute: 3.5 10*3/uL (ref 1.4–7.0)
Neutrophils: 59 %
Platelets: 209 10*3/uL (ref 150–450)
RBC: 5.15 x10E6/uL (ref 4.14–5.80)
RDW: 12.8 % (ref 11.6–15.4)
WBC: 6 10*3/uL (ref 3.4–10.8)

## 2019-12-21 LAB — VITAMIN D 25 HYDROXY (VIT D DEFICIENCY, FRACTURES): Vit D, 25-Hydroxy: 73 ng/mL (ref 30.0–100.0)

## 2019-12-21 LAB — HEMOGLOBIN A1C
Est. average glucose Bld gHb Est-mCnc: 126 mg/dL
Hgb A1c MFr Bld: 6 % — ABNORMAL HIGH (ref 4.8–5.6)

## 2019-12-21 LAB — INSULIN, RANDOM: INSULIN: 29.5 u[IU]/mL — ABNORMAL HIGH (ref 2.6–24.9)

## 2019-12-21 LAB — TSH: TSH: 2.45 u[IU]/mL (ref 0.450–4.500)

## 2019-12-21 LAB — T3: T3, Total: 124 ng/dL (ref 71–180)

## 2019-12-21 LAB — T4: T4, Total: 7.2 ug/dL (ref 4.5–12.0)

## 2019-12-28 DIAGNOSIS — J3081 Allergic rhinitis due to animal (cat) (dog) hair and dander: Secondary | ICD-10-CM | POA: Diagnosis not present

## 2019-12-28 DIAGNOSIS — J3089 Other allergic rhinitis: Secondary | ICD-10-CM | POA: Diagnosis not present

## 2019-12-28 DIAGNOSIS — J301 Allergic rhinitis due to pollen: Secondary | ICD-10-CM | POA: Diagnosis not present

## 2019-12-30 DIAGNOSIS — G4733 Obstructive sleep apnea (adult) (pediatric): Secondary | ICD-10-CM | POA: Diagnosis not present

## 2020-01-02 NOTE — Progress Notes (Signed)
Chief Complaint:   OBESITY Stephen Oconnell (MR# [id number]) is a 74 y.o. male who presents for evaluation and treatment of obesity and related comorbidities. Current BMI is Body mass index is 36.62 kg/m. Stephen Oconnell has been struggling with his weight for many years and has been unsuccessful in either losing weight, maintaining weight loss, or reaching his healthy weight goal.  Jamaurie is currently in the action stage of change and ready to dedicate time achieving and maintaining a healthier weight. Stephen Oconnell is interested in becoming our patient and working on intensive lifestyle modifications including (but not limited to) diet and exercise for weight loss.  Stephen Oconnell's habits were reviewed today and are as follows: His family eats meals together, his desired weight loss is 48 lbs, he has been heavy most of his life, he started gaining weight in 4th grade, his heaviest weight ever was 250 pounds, he has significant food cravings issues, he snacks frequently in the evenings, he frequently makes poor food choices, he frequently eats larger portions than normal and he struggles with emotional eating.  Depression Screen Stephen Oconnell's Food and Mood (modified PHQ-9) score was 8.  Depression screen PHQ 2/9 12/20/2019  Decreased Interest 1  Down, Depressed, Hopeless 1  PHQ - 2 Score 2  Altered sleeping 0  Tired, decreased energy 2  Change in appetite 2  Feeling bad or failure about yourself  1  Trouble concentrating 1  Moving slowly or fidgety/restless 0  Suicidal thoughts 0  PHQ-9 Score 8   Subjective:   1. Other fatigue Stephen Oconnell admits to daytime somnolence and denies waking up still tired. Patent has a history of symptoms of daytime fatigue and morning headache. Stephen Oconnell generally gets 7 hours of sleep per night, and states that he has generally restful sleep. Stephen Oconnell is present. Apneic episodes are present. Epworth Sleepiness Score is 10.  2. Shortness of breath on exertion Stephen Oconnell notes increasing shortness of breath  with exercising and seems to be worsening over time with weight gain. He notes getting out of breath sooner with activity than he used to. This has not gotten worse recently. Stephen Oconnell denies shortness of breath at rest or orthopnea.  3. Abnormal ECG Stephen Oconnell has no other EKG in Epic for comparison. He denies chest pain. He is at high risk of CAD due to obesity, hypertension, and obstructive sleep apnea.  4. Vitamin D deficiency Stephen Oconnell is highly likely to have Vit D deficiency based on weight. He notes fatigue.  5. Essential hypertension Quantavis's blood pressure is well controlled on his medications. He wants to work on diet and weight loss to help decrease his risk of CAD.  6. Hyperglycemia Monterrius shows signs of some elevated glucose levels. He has no lab reports in Epic. He notes polyphagia.  Assessment/Plan:   1. Other fatigue Stephen Oconnell does feel that his weight is causing his energy to be lower than it should be. Fatigue may be related to obesity, depression or many other causes. Labs will be ordered, and in the meanwhile, Stephen Oconnell will focus on self care including making healthy food choices, increasing physical activity and focusing on stress reduction.  - EKG 12-Lead - Hemoglobin A1c - Insulin, random - Lipid Panel With LDL/HDL Ratio - T3 - T4 - TSH  2. Shortness of breath on exertion Stephen Oconnell does feel that he gets out of breath more easily that he used to when he exercises. Stephen Oconnell's shortness of breath appears to be obesity related and exercise induced. He has agreed to  work on weight loss and gradually increase exercise to treat his exercise induced shortness of breath. Will continue to monitor closely.  - CBC with Differential/Platelet - COMPLETE METABOLIC PANEL WITH GFR  3. Abnormal ECG Stephen Oconnell is to follow up with his primary care physician to discuss, EKG was printed out and given to the patient to take to his primary care physician.  4. Vitamin D deficiency Low Vitamin D level contributes to  fatigue and are associated with obesity, breast, and colon cancer. We will check labs today, and [name] will follow-up for routine testing of Vitamin D, at least 2-3 times per year to avoid over-replacement.  - VITAMIN D 25 Hydroxy (Vit-D Deficiency, Fractures)  5. Essential hypertension Stephen Oconnell will work on healthy weight loss, diet, and exercise to improve blood pressure control. We will watch for signs of hypotension as he continues his lifestyle modifications. We will check labs today.  6. Hyperglycemia Fasting labs will be obtained today, and results with be discussed with Stephen Oconnell in 2 weeks at his follow up visit. In the meanwhile Stephen Oconnell will start his Category 3 plan and will work on weight loss efforts.  7. Depression screening [name] had a positive depression screening. Depression is commonly associated with obesity and often results in emotional eating behaviors. We will monitor this closely and work on CBT to help improve the non-hunger eating patterns. Referral to Psychology may be required if no improvement is seen as he continues in our clinic.  8. Class 2 severe obesity with serious comorbidity and body mass index (BMI) of 36.0 to 36.9 in adult, unspecified obesity type Stephen Oconnell) Stephen Oconnell is currently in the action stage of change and his goal is to continue with weight loss efforts. I recommend Stephen Oconnell begin the structured treatment plan as follows:  He has agreed to the Category 3 Plan.  Exercise goals: No exercise has been prescribed for now, while we concentrate on nutritional changes.  Behavioral modification strategies: no skipping meals and meal planning and cooking strategies.  He was informed of the importance of frequent follow-up visits to maximize his success with intensive lifestyle modifications for his multiple health conditions. He was informed we would discuss his lab results at his next visit unless there is a critical issue that needs to be addressed sooner. Stephen Oconnell agreed to keep his  next visit at the agreed upon time to discuss these results.  Objective:   Blood pressure 124/67, pulse 67, temperature 98 F (36.7 C), height 5\' 9"  (1.753 m), weight 248 lb (112.5 kg), SpO2 95 %. Body mass index is 36.62 kg/m.  EKG: Normal sinus rhythm, rate 69 BPM.  Indirect Calorimeter completed today shows a VO2 of 296 and a REE of 2061.  His calculated basal metabolic rate is 2423 thus his basal metabolic rate is worse than expected.  General: Cooperative, alert, well developed, in no acute distress. HEENT: Conjunctivae and lids unremarkable. Cardiovascular: Regular rhythm.  Lungs: Normal work of breathing. Neurologic: No focal deficits.   No results found for: CREATININE, BUN, NA, K, CL, CO2 No results found for: ALT, AST, GGT, ALKPHOS, BILITOT Lab Results  Component Value Date   HGBA1C 6.0 (H) 12/20/2019   Lab Results  Component Value Date   INSULIN 29.5 (H) 12/20/2019   Lab Results  Component Value Date   TSH 2.450 12/20/2019   Lab Results  Component Value Date   CHOL 142 12/20/2019   HDL 48 12/20/2019   LDLCALC 80 12/20/2019   TRIG 72 12/20/2019  Lab Results  Component Value Date   WBC 6.0 12/20/2019   HGB 16.6 12/20/2019   HCT 49.4 12/20/2019   MCV 96 12/20/2019   PLT 209 12/20/2019   No results found for: IRON, TIBC, FERRITIN Obesity Behavioral Intervention:   Approximately 15 minutes were spent on the discussion below.  ASK: We discussed the diagnosis of obesity with Stephen Oconnell today and Haniel agreed to give [name] permission to discuss obesity behavioral modification therapy today.  ASSESS: Rishan has the diagnosis of obesity and his BMI today is 36.61. Paras is in the action stage of change.   ADVISE: Lebaron was educated on the multiple health risks of obesity as well as the benefit of weight loss to improve his health. He was advised of the need for long term treatment and the importance of lifestyle modifications to improve his current health and to  decrease his risk of future health problems.  AGREE: Multiple dietary modification options and treatment options were discussed and Laderius agreed to follow the recommendations documented in the above note.  ARRANGE: Barclay was educated on the importance of frequent visits to treat obesity as outlined per CMS and USPSTF guidelines and agreed to schedule his next follow up appointment today.  Attestation Statements:   Reviewed by clinician on day of visit: allergies, medications, problem list, medical history, surgical history, family history, social history, and previous encounter notes.   I, Trixie Dredge, am acting as transcriptionist for [name], MD.  I have reviewed the above documentation for accuracy and completeness, and I agree with the above. - [name], MD

## 2020-01-03 ENCOUNTER — Ambulatory Visit (INDEPENDENT_AMBULATORY_CARE_PROVIDER_SITE_OTHER): Payer: Medicare HMO | Admitting: Family Medicine

## 2020-01-03 ENCOUNTER — Other Ambulatory Visit (INDEPENDENT_AMBULATORY_CARE_PROVIDER_SITE_OTHER): Payer: Self-pay | Admitting: Family Medicine

## 2020-01-03 ENCOUNTER — Encounter (INDEPENDENT_AMBULATORY_CARE_PROVIDER_SITE_OTHER): Payer: Self-pay | Admitting: Family Medicine

## 2020-01-03 ENCOUNTER — Other Ambulatory Visit: Payer: Self-pay

## 2020-01-03 VITALS — BP 125/63 | HR 63 | Temp 97.8°F | Ht 69.0 in | Wt 244.0 lb

## 2020-01-03 DIAGNOSIS — I10 Essential (primary) hypertension: Secondary | ICD-10-CM

## 2020-01-03 DIAGNOSIS — R7303 Prediabetes: Secondary | ICD-10-CM

## 2020-01-03 DIAGNOSIS — Z6836 Body mass index (BMI) 36.0-36.9, adult: Secondary | ICD-10-CM

## 2020-01-03 MED ORDER — METFORMIN HCL 500 MG PO TABS: 500.0000 mg | ORAL_TABLET | Freq: Every day | ORAL | 0 refills | Status: DC

## 2020-01-03 NOTE — Telephone Encounter (Signed)
Dr Beasley pt 

## 2020-01-04 ENCOUNTER — Encounter: Payer: Self-pay | Admitting: Cardiovascular Disease

## 2020-01-04 ENCOUNTER — Ambulatory Visit: Payer: Medicare HMO | Admitting: Cardiovascular Disease

## 2020-01-04 VITALS — BP 132/72 | HR 70 | Ht 68.0 in | Wt 254.0 lb

## 2020-01-04 DIAGNOSIS — J3081 Allergic rhinitis due to animal (cat) (dog) hair and dander: Secondary | ICD-10-CM | POA: Diagnosis not present

## 2020-01-04 DIAGNOSIS — J3089 Other allergic rhinitis: Secondary | ICD-10-CM | POA: Diagnosis not present

## 2020-01-04 DIAGNOSIS — E782 Mixed hyperlipidemia: Secondary | ICD-10-CM | POA: Diagnosis not present

## 2020-01-04 DIAGNOSIS — I4510 Unspecified right bundle-branch block: Secondary | ICD-10-CM | POA: Diagnosis not present

## 2020-01-04 DIAGNOSIS — I2510 Atherosclerotic heart disease of native coronary artery without angina pectoris: Secondary | ICD-10-CM

## 2020-01-04 DIAGNOSIS — J301 Allergic rhinitis due to pollen: Secondary | ICD-10-CM | POA: Diagnosis not present

## 2020-01-04 NOTE — Patient Instructions (Signed)
Medication Instructions:  Start taking Aspirin 81 mg daily Continue all other medications *If you need a refill on your cardiac medications before your next appointment, please call your pharmacy*   Lab Work: None ordered   Testing/Procedures: Schedule Echo   Follow-Up: At [facility], you and your health needs are our priority.  As part of our continuing mission to provide you with exceptional heart care, we have created designated Provider Care Teams.  These Care Teams include your primary Cardiologist (physician) and Advanced Practice Providers (APPs -  Physician Assistants and Nurse Practitioners) who all work together to provide you with the care you need, when you need it.  We recommend signing up for the patient portal called "MyChart".  Sign up information is provided on this After Visit Summary.  MyChart is used to connect with patients for Virtual Visits (Telemedicine).  Patients are able to view lab/test results, encounter notes, upcoming appointments, etc.  Non-urgent messages can be sent to your provider as well.   To learn more about what you can do with MyChart, go to [email or web address].    Your next appointment:  1 year     Call in July to schedule Oct appointment    The format for your next appointment: Office     Provider:  Dr.O'Neal

## 2020-01-04 NOTE — Progress Notes (Signed)
Cardiology Office Note:   Date:  01/04/2020  NAME:  Stephen Oconnell    MRN: [id number] DOB:  [birth year]   PCP:  Velna Hatchet, MD  Cardiologist:  No primary care provider on file.  Electrophysiologist:  None   Referring MD: Velna Hatchet, MD   Chief Complaint  Patient presents with  . Coronary Artery Disease   History of Present Illness:   Stephen Oconnell is a 74 y.o. male with a hx of CAD, HTN, obesity, OSA who is being seen today for the evaluation of CAD at the request of Velna Hatchet, MD.  He recently underwent coronary calcium scoring.  Apparently he has had a few friends who have died of heart disease.  He is a little concerned about this.  Coronary calcium score shows he is in the 47th percentile.  He reports has been overweight most of his life.  He also has high blood pressure.  Blood pressure well controlled 132/72 on current medications.  His weight is 254 pounds with a BMI of 38.  He is working with a healthy weight and wellness clinic.  He is prediabetic.  A1c 6.0.  He reports he is exercising walking 30 minutes to 1 hour 3 to 4 days/week.  He denies any chest pain or chest pressure.  He does report that he can get short of breath but this is not unexpected for him.  His EKG today demonstrates a right bundle branch block.  He has never had a heart attack or stroke to his knowledge.  He reports he had a stress test 8 years ago in [place].  Reports this was normal.  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  He does have a strong family history of heart disease.  His father died at 45 of a heart attack.  He is a never smoker.  Does not drink alcohol or use drugs.  He is working on his diet.  He does have sleep apnea but uses his CPAP machine.  Overall appears to be doing well.  Serious about getting healthy.  Denies any chest pain, shortness of breath or palpitations in office today.  Problem List 1. CAD -coronary calcium score 138 (47th percentile) -T chol 142, HDL  48, LDL 80, TG 72 2. HTN 3. Obesity 4. OSA  Past Medical History: Past Medical History:  Diagnosis Date  . Allergies   . Asthma    on inhaler  . Constipation   . COPD (chronic obstructive pulmonary disease) ([place])   . Emphysema lung ([place])   . GERD (gastroesophageal reflux disease)   . Glaucoma    right eye  . Hearing loss    Bil/has hearing aids  . HTN (hypertension)   . Leg swelling   . Migraines   . Obesity   . OSA on CPAP   . Skin cancer of forehead     Past Surgical History: Past Surgical History:  Procedure Laterality Date  . CATARACT EXTRACTION    . MOHS SURGERY    . WISDOM TOOTH EXTRACTION      Current Medications: Current Meds  Medication Sig  . Albuterol Sulfate (PROAIR HFA IN) Inhale into the lungs as needed.  Marland Kitchen escitalopram (LEXAPRO) 10 MG tablet Take 10 mg by mouth daily.  Marland Kitchen Galcanezumab-gnlm (EMGALITY ) Inject into the skin every 30 (thirty) days.  Marland Kitchen latanoprost (XALATAN) 0.005 % ophthalmic solution Place 1 drop into the right eye at bedtime.  Marland Kitchen losartan (COZAAR) 50 MG  tablet Take 50 mg by mouth daily.  . metFORMIN (GLUCOPHAGE) 500 MG tablet Take 1 tablet (500 mg total) by mouth daily with breakfast.  . rosuvastatin (CRESTOR) 5 MG tablet Take 5 mg by mouth daily.  . TRIAMTERENE PO Take 37.5 mg by mouth daily.   Current Facility-Administered Medications for the 01/04/20 encounter (Office Visit) with Geralynn Rile, MD  Medication  . 0.9 %  sodium chloride infusion     Allergies:    Penicillins   Social History: Social History   Socioeconomic History  . Marital status: Married    Spouse name: cathy  . Number of children: 2  . Years of education: Not on file  . Highest education level: Not on file  Occupational History  . 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 Smoking status: Never Smoker  . Smokeless tobacco: Never Used  Vaping Use  . Vaping Use: Never used  Substance and Sexual Activity  . Alcohol use: No  . Drug  use: No  . Sexual activity: Not on file  Other Topics Concern  . Not on file  Social History Narrative  . Not on file   Social Determinants of Health   Financial Resource Strain:   . Difficulty of Paying Living Expenses: Not on file  Food Insecurity:   . Worried About Charity fundraiser in the Last Year: Not on file  . Ran Out of Food in the Last Year: Not on file  Transportation Needs:   . Lack of Transportation (Medical): Not on file  . Lack of Transportation (Non-Medical): Not on file  Physical Activity:   . Days of Exercise per Week: Not on file  . Minutes of Exercise per Session: Not on file  Stress:   . Feeling of Stress : Not on file  Social Connections:   . Frequency of Communication with Friends and Family: Not on file  . Frequency of Social Gatherings with Friends and Family: Not on file  . Attends Religious Services: Not on file  . Active Member of Clubs or Organizations: Not on file  . Attends Archivist Meetings: Not on file  . Marital Status: Not on file     Family History: The patient's family history includes Anxiety disorder in his father; Depression in his father; Heart disease in his father; Hypertension in his father; Mental illness in his sister; Obesity in his mother; Stroke in his mother; Sudden death in his father; Thyroid disease in his mother.  ROS:   All other ROS reviewed and negative. Pertinent positives noted in the HPI.     EKGs/Labs/Other Studies Reviewed:   The following studies were personally reviewed by me today:  EKG:  EKG is ordered today.  The ekg ordered today demonstrates normal sinus rhythm, heart rate 70, right bundle branch block noted, and was personally reviewed by me.   Recent Labs: 12/20/2019: Hemoglobin 16.6; Platelets 209; TSH 2.450   Recent Lipid Panel    Component Value Date/Time   CHOL 142 12/20/2019 0832   TRIG 72 12/20/2019 0832   HDL 48 12/20/2019 0832   LDLCALC 80 12/20/2019 0832    Physical  Exam:   VS:  BP 132/72   Pulse 70   Ht 5\' 8"  (1.727 m)   Wt 254 lb (115.2 kg)   SpO2 97%   BMI 38.62 kg/m    Wt Readings from Last 3 Encounters:  01/04/20 254 lb (115.2 kg)  01/03/20 244 lb (110.7 kg)  12/20/19  248 lb (112.5 kg)    General: Well nourished, well developed, in no acute distress Heart: Atraumatic, normal size  Eyes: PEERLA, EOMI  Neck: Supple, no JVD Endocrine: No thryomegaly Cardiac: Normal S1, S2; RRR; no murmurs, rubs, or gallops Lungs: Clear to auscultation bilaterally, no wheezing, rhonchi or rales  Abd: Soft, nontender, no hepatomegaly  Ext: No edema, pulses 2+ Musculoskeletal: No deformities, BUE and BLE strength normal and equal Skin: Warm and dry, no rashes   Neuro: Alert and oriented to person, place, time, and situation, CNII-XII grossly intact, no focal deficits  Psych: Normal mood and affect   ASSESSMENT:   Kasra Melvin is a 74 y.o. male who presents for the following: 1. Coronary artery disease involving native coronary artery of native heart without angina pectoris   2. Mixed hyperlipidemia   3. RBBB     PLAN:   1. Coronary artery disease involving native coronary artery of native heart without angina pectoris -Most recent LDL cholesterol 80.  Coronary calcium score in the 47th percentile.  No symptoms of angina.  EKG with right bundle branch block. -He is still fairly low risk.  He is on Crestor 5 mg daily.  I recommend he continue this.  I would recommend he get his LDL less than 70 if he want to lower his risk of future heart attack or stroke.  Further titration of this to be done by his primary care physician.  I will see him yearly.  Given that his score was only around 147.  There is no need for stress test at this time.  Should his symptoms change he will let us know.  2. Mixed hyperlipidemia -Most recent LDL 80.  Continue Crestor.  3. RBBB -We will check an echocardiogram.  No signs of infarction.  This can be a normal finding for his  age.  Disposition: Return in about 1 year (around 01/03/2021).  Medication Adjustments/Labs and Tests Ordered: Current medicines are reviewed at length with the patient today.  Concerns regarding medicines are outlined above.  Orders Placed This Encounter  Procedures  . EKG 12-Lead  . ECHOCARDIOGRAM COMPLETE   No orders of the defined types were placed in this encounter.   Patient Instructions  Medication Instructions:  Start taking Aspirin 81 mg daily Continue all other medications *If you need a refill on your cardiac medications before your next appointment, please call your pharmacy*   Lab Work: None ordered   Testing/Procedures: Schedule Echo   Follow-Up: At [facility], you and your health needs are our priority.  As part of our continuing mission to provide you with exceptional heart care, we have created designated Provider Care Teams.  These Care Teams include your primary Cardiologist (physician) and Advanced Practice Providers (APPs -  Physician Assistants and Nurse Practitioners) who all work together to provide you with the care you need, when you need it.  We recommend signing up for the patient portal called "MyChart".  Sign up information is provided on this After Visit Summary.  MyChart is used to connect with patients for Virtual Visits (Telemedicine).  Patients are able to view lab/test results, encounter notes, upcoming appointments, etc.  Non-urgent messages can be sent to your provider as well.   To learn more about what you can do with MyChart, go to [email or web address].    Your next appointment:  1 year     Call in July to schedule Oct appointment    The format for your next appointment: Office  Provider:  Dr.O'Neal      Signed, Addison Naegeli. Audie Box, [place]  [address] [place], [place] [address] [phone]  01/04/2020 10:00 AM

## 2020-01-07 NOTE — Progress Notes (Signed)
Chief Complaint:   OBESITY Stephen Oconnell is here to discuss his progress with his obesity treatment plan along with follow-up of his obesity related diagnoses. Dodd is on the Category 3 Plan and states he is following his eating plan approximately 85% of the time. Hung states he is walking for 45-60 minutes 3-4 times per week.  Today's visit was #: 2 Starting weight: 248 lbs Starting date: 12/20/2019 Today's weight: 244 lbs Today's date: 01/03/2020 Total lbs lost to date: 4 Total lbs lost since last in-office visit: 4  Interim History: Stephen Oconnell has done well with weight loss on his Category 3 plan. He sometimes struggled to eat all of the food at times. His hunger was mostly controlled, but sometimes it was an issue especially during the second half of the day.  Subjective:   1. Pre-diabetes [name] has a new diagnosis of pre-diabetes. His A1c is elevated at 6.0, and fasting insulin is also elevated. He notes polyphagia. I discussed labs with the patient today.  2. Essential hypertension [name]'s blood pressure is well controlled today. He is stable on Cozaar, and is doing well with diet and weight loss efforts.  Assessment/Plan:   1. Pre-diabetes Stephen Oconnell will continue to work on weight loss, exercise, and decreasing simple carbohydrates to help decrease the risk of diabetes. Stephen Oconnell agreed to start metformin 500 mg q AM with no refills. Pre-diabetes handout and metformin information handout was given to the patient today.  - metFORMIN (GLUCOPHAGE) 500 MG tablet; Take 1 tablet (500 mg total) by mouth daily with breakfast.  Dispense: 30 tablet; Refill: 0  2. Essential hypertension Stephen Oconnell will continue his medications, healthy weight loss, diet, and exercise to improve blood pressure control. We will watch for signs of hypotension as he continues his lifestyle modifications.  3. Class 2 severe obesity with serious comorbidity and body mass index (BMI) of 36.0 to 36.9 in adult, unspecified obesity type  [name]) Stephen Oconnell is currently in the action stage of change. As such, his goal is to continue with weight loss efforts. He has agreed to the Category 3 Plan.   Exercise goals: As is.  Behavioral modification strategies: increasing lean protein intake and meal planning and cooking strategies.  Stephen Oconnell has agreed to follow-up with our clinic in 2 weeks. He was informed of the importance of frequent follow-up visits to maximize his success with intensive lifestyle modifications for his multiple health conditions.   Objective:   Blood pressure 125/63, pulse 63, temperature 97.8 F (36.6 C), height 5\' 9"  (1.753 m), weight 244 lb (110.7 kg), SpO2 95 %. Body mass index is 36.03 kg/m.  General: Cooperative, alert, well developed, in no acute distress. HEENT: Conjunctivae and lids unremarkable. Cardiovascular: Regular rhythm.  Lungs: Normal work of breathing. Neurologic: No focal deficits.   No results found for: CREATININE, BUN, NA, K, CL, CO2 No results found for: ALT, AST, GGT, ALKPHOS, BILITOT Lab Results  Component Value Date   HGBA1C 6.0 (H) 12/20/2019   Lab Results  Component Value Date   INSULIN 29.5 (H) 12/20/2019   Lab Results  Component Value Date   TSH 2.450 12/20/2019   Lab Results  Component Value Date   CHOL 142 12/20/2019   HDL 48 12/20/2019   LDLCALC 80 12/20/2019   TRIG 72 12/20/2019   Lab Results  Component Value Date   WBC 6.0 12/20/2019   HGB 16.6 12/20/2019   HCT 49.4 12/20/2019   MCV 96 12/20/2019   PLT 209 12/20/2019  No results found for: IRON, TIBC, FERRITIN  Obesity Behavioral Intervention:   Approximately 15 minutes were spent on the discussion below.  ASK: We discussed the diagnosis of obesity with Stephen Oconnell today and Stephen Oconnell agreed to give Stephen Oconnell permission to discuss obesity behavioral modification therapy today.  ASSESS: Stephen Oconnell has the diagnosis of obesity and his BMI today is 36.02. Stephen Oconnell is in the action stage of change.   ADVISE: Stephen Oconnell was educated on  the multiple health risks of obesity as well as the benefit of weight loss to improve his health. He was advised of the need for long term treatment and the importance of lifestyle modifications to improve his current health and to decrease his risk of future health problems.  AGREE: Multiple dietary modification options and treatment options were discussed and Stephen Oconnell agreed to follow the recommendations documented in the above note.  ARRANGE: Stephen Oconnell was educated on the importance of frequent visits to treat obesity as outlined per CMS and USPSTF guidelines and agreed to schedule his next follow up appointment today.  Attestation Statements:   Reviewed by clinician on day of visit: allergies, medications, problem list, medical history, surgical history, family history, social history, and previous encounter notes.   I, Trixie Dredge, am acting as transcriptionist for [name], MD.  I have reviewed the above documentation for accuracy and completeness, and I agree with the above. -  [name], MD

## 2020-01-08 DIAGNOSIS — H401131 Primary open-angle glaucoma, bilateral, mild stage: Secondary | ICD-10-CM | POA: Diagnosis not present

## 2020-01-09 DIAGNOSIS — J3089 Other allergic rhinitis: Secondary | ICD-10-CM | POA: Diagnosis not present

## 2020-01-09 DIAGNOSIS — J301 Allergic rhinitis due to pollen: Secondary | ICD-10-CM | POA: Diagnosis not present

## 2020-01-16 DIAGNOSIS — J3089 Other allergic rhinitis: Secondary | ICD-10-CM | POA: Diagnosis not present

## 2020-01-16 DIAGNOSIS — J301 Allergic rhinitis due to pollen: Secondary | ICD-10-CM | POA: Diagnosis not present

## 2020-01-17 ENCOUNTER — Ambulatory Visit (INDEPENDENT_AMBULATORY_CARE_PROVIDER_SITE_OTHER): Payer: Medicare HMO | Admitting: Family Medicine

## 2020-01-17 ENCOUNTER — Other Ambulatory Visit: Payer: Self-pay

## 2020-01-17 ENCOUNTER — Encounter (INDEPENDENT_AMBULATORY_CARE_PROVIDER_SITE_OTHER): Payer: Self-pay | Admitting: Family Medicine

## 2020-01-17 VITALS — BP 132/69 | HR 68 | Temp 97.8°F | Ht 69.0 in | Wt 243.0 lb

## 2020-01-17 DIAGNOSIS — R7303 Prediabetes: Secondary | ICD-10-CM | POA: Diagnosis not present

## 2020-01-17 DIAGNOSIS — Z6836 Body mass index (BMI) 36.0-36.9, adult: Secondary | ICD-10-CM | POA: Diagnosis not present

## 2020-01-17 MED ORDER — METFORMIN HCL 500 MG PO TABS: 500.0000 mg | ORAL_TABLET | Freq: Every day | ORAL | 0 refills | Status: DC

## 2020-01-21 NOTE — Progress Notes (Signed)
Chief Complaint:   OBESITY Stephen Oconnell is here to discuss his progress with his obesity treatment plan along with follow-up of his obesity related diagnoses. Dandrae is on the Category 3 Plan and states he is following his eating plan approximately 95% of the time. Rashawd states he is walking for 45-60 minutes 3-4 times per week.  Today's visit was #: 3 Starting weight: 248 lbs Starting date: 12/20/2019 Today's weight: 243 lbs Today's date: 01/17/2020 Total lbs lost to date: 5 Total lbs lost since last in-office visit: 1  Interim History: Nello continues to do well with weight loss. He notes his hunger  Is mostly controlled on his plan, but meals are starting to get a bit boring. He is open to discussing holiday eating strategies.  Subjective:   1. Pre-diabetes Desiree continues to do well on his eating plan, and he is trying to decrease his sugars.  Assessment/Plan:   1. Pre-diabetes Sione will continue to work on weight loss, diet, exercise, and decreasing simple carbohydrates to help decrease the risk of diabetes. We will refill metformin for 1 month.  - metFORMIN (GLUCOPHAGE) 500 MG tablet; Take 1 tablet (500 mg total) by mouth daily with breakfast.  Dispense: 30 tablet; Refill: 0  2. Class 2 severe obesity with serious comorbidity and body mass index (BMI) of 36.0 to 36.9 in adult, unspecified obesity type [facility]) Peyton is currently in the action stage of change. As such, his goal is to continue with weight loss efforts. He has agreed to the Category 3 Plan.   Exercise goals: As is.  Behavioral modification strategies: increasing lean protein intake and holiday eating strategies .  Tyee has agreed to follow-up with our clinic in 2 weeks. He was informed of the importance of frequent follow-up visits to maximize his success with intensive lifestyle modifications for his multiple health conditions.   Objective:   Blood pressure 132/69, pulse 68, temperature 97.8 F (36.6 C), height 5'  9" (1.753 m), weight 243 lb (110.2 kg), SpO2 96 %. Body mass index is 35.88 kg/m.  General: Cooperative, alert, well developed, in no acute distress. HEENT: Conjunctivae and lids unremarkable. Cardiovascular: Regular rhythm.  Lungs: Normal work of breathing. Neurologic: No focal deficits.   No results found for: CREATININE, BUN, NA, K, CL, CO2 No results found for: ALT, AST, GGT, ALKPHOS, BILITOT Lab Results  Component Value Date   HGBA1C 6.0 (H) 12/20/2019   Lab Results  Component Value Date   INSULIN 29.5 (H) 12/20/2019   Lab Results  Component Value Date   TSH 2.450 12/20/2019   Lab Results  Component Value Date   CHOL 142 12/20/2019   HDL 48 12/20/2019   LDLCALC 80 12/20/2019   TRIG 72 12/20/2019   Lab Results  Component Value Date   WBC 6.0 12/20/2019   HGB 16.6 12/20/2019   HCT 49.4 12/20/2019   MCV 96 12/20/2019   PLT 209 12/20/2019   No results found for: IRON, TIBC, FERRITIN  Obesity Behavioral Intervention:   Approximately 15 minutes were spent on the discussion below.  ASK: We discussed the diagnosis of obesity with [name] today and Loren agreed to give [name] permission to discuss obesity behavioral modification therapy today.  ASSESS: Jonnie has the diagnosis of obesity and his BMI today is 35.87. Siddh is in the action stage of change.   ADVISE: Esvin was educated on the multiple health risks of obesity as well as the benefit of weight loss to improve his health.  He was advised of the need for long term treatment and the importance of lifestyle modifications to improve his current health and to decrease his risk of future health problems.  AGREE: Multiple dietary modification options and treatment options were discussed and Wolf agreed to follow the recommendations documented in the above note.  ARRANGE: Mung was educated on the importance of frequent visits to treat obesity as outlined per CMS and USPSTF guidelines and agreed to schedule his next follow  up appointment today.  Attestation Statements:   Reviewed by clinician on day of visit: allergies, medications, problem list, medical history, surgical history, family history, social history, and previous encounter notes.   I, Trixie Dredge, am acting as transcriptionist for [name], MD.  I have reviewed the above documentation for accuracy and completeness, and I agree with the above. -  [name], MD

## 2020-01-24 DIAGNOSIS — J3081 Allergic rhinitis due to animal (cat) (dog) hair and dander: Secondary | ICD-10-CM | POA: Diagnosis not present

## 2020-01-24 DIAGNOSIS — J3089 Other allergic rhinitis: Secondary | ICD-10-CM | POA: Diagnosis not present

## 2020-01-24 DIAGNOSIS — J301 Allergic rhinitis due to pollen: Secondary | ICD-10-CM | POA: Diagnosis not present

## 2020-01-28 ENCOUNTER — Ambulatory Visit (HOSPITAL_COMMUNITY): Payer: Medicare HMO | Attending: Cardiology

## 2020-01-28 ENCOUNTER — Other Ambulatory Visit: Payer: Self-pay

## 2020-01-28 DIAGNOSIS — E782 Mixed hyperlipidemia: Secondary | ICD-10-CM | POA: Insufficient documentation

## 2020-01-28 DIAGNOSIS — I4510 Unspecified right bundle-branch block: Secondary | ICD-10-CM | POA: Diagnosis not present

## 2020-01-28 DIAGNOSIS — I2510 Atherosclerotic heart disease of native coronary artery without angina pectoris: Secondary | ICD-10-CM | POA: Diagnosis not present

## 2020-01-28 LAB — ECHOCARDIOGRAM COMPLETE
Area-P 1/2: 3.53 cm2
S' Lateral: 2.6 cm

## 2020-01-29 ENCOUNTER — Ambulatory Visit (INDEPENDENT_AMBULATORY_CARE_PROVIDER_SITE_OTHER): Payer: Medicare HMO | Admitting: Family Medicine

## 2020-01-29 ENCOUNTER — Encounter (INDEPENDENT_AMBULATORY_CARE_PROVIDER_SITE_OTHER): Payer: Self-pay | Admitting: Family Medicine

## 2020-01-29 VITALS — BP 111/60 | HR 64 | Temp 98.0°F | Ht 69.0 in | Wt 239.0 lb

## 2020-01-29 DIAGNOSIS — R7303 Prediabetes: Secondary | ICD-10-CM

## 2020-01-29 DIAGNOSIS — Z6835 Body mass index (BMI) 35.0-35.9, adult: Secondary | ICD-10-CM | POA: Diagnosis not present

## 2020-01-29 MED ORDER — METFORMIN HCL 500 MG PO TABS: 500.0000 mg | ORAL_TABLET | Freq: Every day | ORAL | 0 refills | Status: DC

## 2020-01-30 DIAGNOSIS — J3089 Other allergic rhinitis: Secondary | ICD-10-CM | POA: Diagnosis not present

## 2020-01-30 DIAGNOSIS — J3081 Allergic rhinitis due to animal (cat) (dog) hair and dander: Secondary | ICD-10-CM | POA: Diagnosis not present

## 2020-01-30 DIAGNOSIS — J301 Allergic rhinitis due to pollen: Secondary | ICD-10-CM | POA: Diagnosis not present

## 2020-01-30 NOTE — Progress Notes (Signed)
Chief Complaint:   OBESITY Stephen Oconnell is here to discuss his progress with his obesity treatment plan along with follow-up of his obesity related diagnoses. Stephen Oconnell is on the Category 3 Plan and states he is following his eating plan approximately 95% of the time. Stephen Oconnell states he is walking 5,000 steps daily.   Today's visit was #: 4 Starting weight: 248 lbs Starting date: 12/20/2019 Today's weight: 239 lbs Today's date: 01/29/2020 Total lbs lost to date: 9 Total lbs lost since last in-office visit: 4  Interim History: Stephen Oconnell continues to do well with weight loss. He has some strategies to help damage control his Thanksgiving celebration eating. He is walking most days for exercise approximately 45-60 minutes and sometimes more.  Subjective:   1. Pre-diabetes Stephen Oconnell is tolerating metformin well, and he denies nausea or vomiting. He is doing very well on his eating plan and exercise.  Assessment/Plan:   1. Pre-diabetes Stephen Oconnell will continue to work on weight loss, exercise, and decreasing simple carbohydrates to help decrease the risk of diabetes. We will refill metformin for 1 month, and we will plan to recheck labs in 1-2 months.  - metFORMIN (GLUCOPHAGE) 500 MG tablet; Take 1 tablet (500 mg total) by mouth daily with breakfast.  Dispense: 30 tablet; Refill: 0  2. Class 2 severe obesity with serious comorbidity and body mass index (BMI) of 35.0 to 35.9 in adult, unspecified obesity type Stephen Oconnell) Stephen Oconnell is currently in the action stage of change. As such, his goal is to continue with weight loss efforts. He has agreed to the Category 3 Plan.   Exercise goals: As is, Jasmin is to consider adding in strengthening exercise.  Behavioral modification strategies: holiday eating strategies .  Stephen Oconnell has agreed to follow-up with our clinic in 3 weeks. He was informed of the importance of frequent follow-up visits to maximize his success with intensive lifestyle modifications for his multiple health  conditions.   Objective:   Blood pressure 111/60, pulse 64, temperature 98 F (36.7 C), height 5\' 9"  (1.753 m), weight 239 lb (108.4 kg), SpO2 97 %. Body mass index is 35.29 kg/m.  General: Cooperative, alert, well developed, in no acute distress. HEENT: Conjunctivae and lids unremarkable. Cardiovascular: Regular rhythm.  Lungs: Normal work of breathing. Neurologic: No focal deficits.   No results found for: CREATININE, BUN, NA, K, CL, CO2 No results found for: ALT, AST, GGT, ALKPHOS, BILITOT Lab Results  Component Value Date   HGBA1C 6.0 (H) 12/20/2019   Lab Results  Component Value Date   INSULIN 29.5 (H) 12/20/2019   Lab Results  Component Value Date   TSH 2.450 12/20/2019   Lab Results  Component Value Date   CHOL 142 12/20/2019   HDL 48 12/20/2019   LDLCALC 80 12/20/2019   TRIG 72 12/20/2019   Lab Results  Component Value Date   WBC 6.0 12/20/2019   HGB 16.6 12/20/2019   HCT 49.4 12/20/2019   MCV 96 12/20/2019   PLT 209 12/20/2019   No results found for: IRON, TIBC, FERRITIN  Attestation Statements:   Reviewed by clinician on day of visit: allergies, medications, problem list, medical history, surgical history, family history, social history, and previous encounter notes.  Time spent on visit including pre-visit chart review and post-visit care and charting was 30 minutes.    I, Trixie Dredge, am acting as transcriptionist for [name], MD.  I have reviewed the above documentation for accuracy and completeness, and I agree with the above. -  [name], MD

## 2020-02-07 DIAGNOSIS — J3089 Other allergic rhinitis: Secondary | ICD-10-CM | POA: Diagnosis not present

## 2020-02-07 DIAGNOSIS — J3081 Allergic rhinitis due to animal (cat) (dog) hair and dander: Secondary | ICD-10-CM | POA: Diagnosis not present

## 2020-02-07 DIAGNOSIS — J301 Allergic rhinitis due to pollen: Secondary | ICD-10-CM | POA: Diagnosis not present

## 2020-02-12 ENCOUNTER — Ambulatory Visit (INDEPENDENT_AMBULATORY_CARE_PROVIDER_SITE_OTHER): Payer: Medicare HMO | Admitting: Family Medicine

## 2020-02-12 ENCOUNTER — Other Ambulatory Visit: Payer: Self-pay

## 2020-02-12 ENCOUNTER — Encounter (INDEPENDENT_AMBULATORY_CARE_PROVIDER_SITE_OTHER): Payer: Self-pay | Admitting: Family Medicine

## 2020-02-12 VITALS — BP 136/66 | HR 63 | Temp 98.0°F | Ht 69.0 in | Wt 237.0 lb

## 2020-02-12 DIAGNOSIS — Z6835 Body mass index (BMI) 35.0-35.9, adult: Secondary | ICD-10-CM

## 2020-02-12 DIAGNOSIS — R7303 Prediabetes: Secondary | ICD-10-CM

## 2020-02-12 MED ORDER — METFORMIN HCL 500 MG PO TABS: 500.0000 mg | ORAL_TABLET | Freq: Every day | ORAL | 0 refills | Status: DC

## 2020-02-13 NOTE — Progress Notes (Signed)
Chief Complaint:   OBESITY Stephen Oconnell is here to discuss his progress with his obesity treatment plan along with follow-up of his obesity related diagnoses. Stephen Oconnell is on the Category 3 Plan and states he is following his eating plan approximately 90% of the time. Stephen Oconnell states he is walking on average 7,600 steps daily.   Today's visit was #: 5 Starting weight: 248 lbs Starting date: 12/20/2019 Today's weight: 237 lbs Today's date: 02/12/2020 Total lbs lost to date: 11 Total lbs lost since last in-office visit: 2  Interim History: Stephen Oconnell continues to do well with weight loss even over Thanksgiving. He is working on increasing lean protein but sometimes struggles to eat.  Subjective:   1. Pre-diabetes Stephen Oconnell is stable on metformin, and he is working on diet, exercise, and weight loss. He denies nausea or vomiting.  Assessment/Plan:   1. Pre-diabetes Stephen Oconnell will continue to work on weight loss, exercise, and decreasing simple carbohydrates to help decrease the risk of diabetes. We will refill metformin for 1 month, and we will recheck labs in 6 weeks.  - metFORMIN (GLUCOPHAGE) 500 MG tablet; Take 1 tablet (500 mg total) by mouth daily with breakfast.  Dispense: 30 tablet; Refill: 0  2. Class 2 severe obesity with serious comorbidity and body mass index (BMI) of 35.0 to 35.9 in adult, unspecified obesity type Stephen Oconnell) Stephen Oconnell is currently in the action stage of change. As such, his goal is to continue with weight loss efforts. He has agreed to the Category 3 Plan.   Lean meat equivalents handout was given today.  Exercise goals: As is.  Behavioral modification strategies: increasing lean protein intake, meal planning and cooking strategies and holiday eating strategies .  Blandon has agreed to follow-up with our clinic in 4 weeks. He was informed of the importance of frequent follow-up visits to maximize his success with intensive lifestyle modifications for his multiple health conditions.    Objective:   Blood pressure 136/66, pulse 63, temperature 98 F (36.7 C), height 5\' 9"  (1.753 m), weight 237 lb (107.5 kg), SpO2 97 %. Body mass index is 35 kg/m.  General: Cooperative, alert, well developed, in no acute distress. HEENT: Conjunctivae and lids unremarkable. Cardiovascular: Regular rhythm.  Lungs: Normal work of breathing. Neurologic: No focal deficits.   No results found for: CREATININE, BUN, NA, K, CL, CO2 No results found for: ALT, AST, GGT, ALKPHOS, BILITOT Lab Results  Component Value Date   HGBA1C 6.0 (H) 12/20/2019   Lab Results  Component Value Date   INSULIN 29.5 (H) 12/20/2019   Lab Results  Component Value Date   TSH 2.450 12/20/2019   Lab Results  Component Value Date   CHOL 142 12/20/2019   HDL 48 12/20/2019   LDLCALC 80 12/20/2019   TRIG 72 12/20/2019   Lab Results  Component Value Date   WBC 6.0 12/20/2019   HGB 16.6 12/20/2019   HCT 49.4 12/20/2019   MCV 96 12/20/2019   PLT 209 12/20/2019   No results found for: IRON, TIBC, FERRITIN  Attestation Statements:   Reviewed by clinician on day of visit: allergies, medications, problem list, medical history, surgical history, family history, social history, and previous encounter notes.  Time spent on visit including pre-visit chart review and post-visit care and charting was 30 minutes.    I, Trixie Dredge, am acting as transcriptionist for [name], MD.  I have reviewed the above documentation for accuracy and completeness, and I agree with the above. -  [name], MD

## 2020-02-14 DIAGNOSIS — J3089 Other allergic rhinitis: Secondary | ICD-10-CM | POA: Diagnosis not present

## 2020-02-14 DIAGNOSIS — J3081 Allergic rhinitis due to animal (cat) (dog) hair and dander: Secondary | ICD-10-CM | POA: Diagnosis not present

## 2020-02-14 DIAGNOSIS — J301 Allergic rhinitis due to pollen: Secondary | ICD-10-CM | POA: Diagnosis not present

## 2020-02-26 DIAGNOSIS — H2513 Age-related nuclear cataract, bilateral: Secondary | ICD-10-CM | POA: Diagnosis not present

## 2020-02-26 DIAGNOSIS — J3081 Allergic rhinitis due to animal (cat) (dog) hair and dander: Secondary | ICD-10-CM | POA: Diagnosis not present

## 2020-02-26 DIAGNOSIS — H401131 Primary open-angle glaucoma, bilateral, mild stage: Secondary | ICD-10-CM | POA: Diagnosis not present

## 2020-02-26 DIAGNOSIS — J3089 Other allergic rhinitis: Secondary | ICD-10-CM | POA: Diagnosis not present

## 2020-02-26 DIAGNOSIS — J301 Allergic rhinitis due to pollen: Secondary | ICD-10-CM | POA: Diagnosis not present

## 2020-02-27 ENCOUNTER — Other Ambulatory Visit (INDEPENDENT_AMBULATORY_CARE_PROVIDER_SITE_OTHER): Payer: Self-pay | Admitting: Family Medicine

## 2020-02-27 DIAGNOSIS — R7303 Prediabetes: Secondary | ICD-10-CM

## 2020-02-27 DIAGNOSIS — G4733 Obstructive sleep apnea (adult) (pediatric): Secondary | ICD-10-CM | POA: Diagnosis not present

## 2020-03-05 DIAGNOSIS — J301 Allergic rhinitis due to pollen: Secondary | ICD-10-CM | POA: Diagnosis not present

## 2020-03-05 DIAGNOSIS — J3089 Other allergic rhinitis: Secondary | ICD-10-CM | POA: Diagnosis not present

## 2020-03-05 DIAGNOSIS — J3081 Allergic rhinitis due to animal (cat) (dog) hair and dander: Secondary | ICD-10-CM | POA: Diagnosis not present

## 2020-03-06 DIAGNOSIS — J301 Allergic rhinitis due to pollen: Secondary | ICD-10-CM | POA: Diagnosis not present

## 2020-03-06 DIAGNOSIS — J3089 Other allergic rhinitis: Secondary | ICD-10-CM | POA: Diagnosis not present

## 2020-03-06 DIAGNOSIS — J3081 Allergic rhinitis due to animal (cat) (dog) hair and dander: Secondary | ICD-10-CM | POA: Diagnosis not present

## 2020-03-11 ENCOUNTER — Ambulatory Visit (INDEPENDENT_AMBULATORY_CARE_PROVIDER_SITE_OTHER): Payer: Medicare Other | Admitting: Family Medicine

## 2020-03-11 ENCOUNTER — Encounter (INDEPENDENT_AMBULATORY_CARE_PROVIDER_SITE_OTHER): Payer: Self-pay | Admitting: Family Medicine

## 2020-03-11 ENCOUNTER — Other Ambulatory Visit: Payer: Self-pay

## 2020-03-11 VITALS — BP 130/68 | HR 71 | Temp 98.1°F | Ht 69.0 in | Wt 237.0 lb

## 2020-03-11 DIAGNOSIS — I10 Essential (primary) hypertension: Secondary | ICD-10-CM

## 2020-03-11 DIAGNOSIS — Z6835 Body mass index (BMI) 35.0-35.9, adult: Secondary | ICD-10-CM | POA: Diagnosis not present

## 2020-03-13 NOTE — Progress Notes (Signed)
Chief Complaint:   OBESITY Stephen Oconnell is here to discuss his progress with his obesity treatment plan along with follow-up of his obesity related diagnoses. Stephen Oconnell is on the Category 3 Plan and states he is following his eating plan approximately 80% of the time. Stephen Oconnell states he is walking for 60-80 minutes 5 times per week.  Today's visit was #: 6 Starting weight: 248 lbs Starting date: 12/20/2019 Today's weight: 237 lbs Today's date: 03/11/2020 Total lbs lost to date: 11 Total lbs lost since last in-office visit: 0  Interim History: Stephen Oconnell is doing well maintaining his weight even over the holidays. He did some celebration eating but was mindful about portion control and he tried to make some smarter choices. He feels it will be easier to get back on track now that the holidays are over.  Subjective:   1. Essential hypertension Stephen Oconnell's blood pressure is well controlled even after some holiday eating. He is doing well working on diet and weight loss, and he may even be able to decrease the need for medications in the future.  Assessment/Plan:   1. Essential hypertension Stephen Oconnell will continue Cozaar and diet to improve blood pressure control. We will watch for signs of hypotension as he continues his lifestyle modifications.  2. Class 2 severe obesity with serious comorbidity and body mass index (BMI) of 35.0 to 35.9 in adult, unspecified obesity type Stephen Oconnell) Stephen Oconnell is currently in the action stage of change. As such, his goal is to continue with weight loss efforts. He has agreed to the Category 3 Plan.    Exercise goals: As is, add strengthening exercise.  Behavioral modification strategies: no skipping meals and planning for success.  Stephen Oconnell has agreed to follow-up with our clinic in 3 weeks. He was informed of the importance of frequent follow-up visits to maximize his success with intensive lifestyle modifications for his multiple health conditions.   Objective:   Blood pressure 130/68,  pulse 71, temperature 98.1 F (36.7 C), height 5\' 9"  (1.753 m), weight 237 lb (107.5 kg), SpO2 97 %. Body mass index is 35 kg/m.  General: Cooperative, alert, well developed, in no acute distress. HEENT: Conjunctivae and lids unremarkable. Cardiovascular: Regular rhythm.  Lungs: Normal work of breathing. Neurologic: No focal deficits.   No results found for: CREATININE, BUN, NA, K, CL, CO2 No results found for: ALT, AST, GGT, ALKPHOS, BILITOT Lab Results  Component Value Date   HGBA1C 6.0 (H) 12/20/2019   Lab Results  Component Value Date   INSULIN 29.5 (H) 12/20/2019   Lab Results  Component Value Date   TSH 2.450 12/20/2019   Lab Results  Component Value Date   CHOL 142 12/20/2019   HDL 48 12/20/2019   LDLCALC 80 12/20/2019   TRIG 72 12/20/2019   Lab Results  Component Value Date   WBC 6.0 12/20/2019   HGB 16.6 12/20/2019   HCT 49.4 12/20/2019   MCV 96 12/20/2019   PLT 209 12/20/2019   No results found for: IRON, TIBC, FERRITIN  Attestation Statements:   Reviewed by clinician on day of visit: allergies, medications, problem list, medical history, surgical history, family history, social history, and previous encounter notes.  Time spent on visit including pre-visit chart review and post-visit care and charting was 20 minutes.    I, 12/22/2019, am acting as transcriptionist for Burt Knack, MD.  I have reviewed the above documentation for accuracy and completeness, and I agree with the above. -  Quillian Quince, MD

## 2020-03-25 ENCOUNTER — Encounter (INDEPENDENT_AMBULATORY_CARE_PROVIDER_SITE_OTHER): Payer: Self-pay

## 2020-03-26 ENCOUNTER — Other Ambulatory Visit: Payer: Self-pay

## 2020-03-26 ENCOUNTER — Encounter (INDEPENDENT_AMBULATORY_CARE_PROVIDER_SITE_OTHER): Payer: Self-pay | Admitting: Family Medicine

## 2020-03-26 ENCOUNTER — Telehealth (INDEPENDENT_AMBULATORY_CARE_PROVIDER_SITE_OTHER): Payer: Medicare Other | Admitting: Family Medicine

## 2020-03-26 DIAGNOSIS — R7303 Prediabetes: Secondary | ICD-10-CM

## 2020-03-26 DIAGNOSIS — Z6835 Body mass index (BMI) 35.0-35.9, adult: Secondary | ICD-10-CM

## 2020-03-26 MED ORDER — METFORMIN HCL 500 MG PO TABS: 500.0000 mg | ORAL_TABLET | Freq: Every day | ORAL | 0 refills | Status: DC

## 2020-03-27 NOTE — Progress Notes (Signed)
TeleHealth Visit:  Due to the COVID-19 pandemic, this visit was completed with telemedicine (audio/video) technology to reduce patient and provider exposure as well as to preserve personal protective equipment.   [name] has verbally consented to this TeleHealth visit. The patient is located at home, the provider is located at the [facility] office. The participants in this visit include the listed provider and patient. The visit was conducted today via MyChart video.   Chief Complaint: OBESITY Stephen Oconnell is here to discuss his progress with his obesity treatment plan along with follow-up of his obesity related diagnoses. Stephen Oconnell is on the Category 3 Plan and states he is following his eating plan approximately 85-90% of the time. Stephen Oconnell states he is walking for 80 minutes 4 times per week.  Today's visit was #: 7 Starting weight: 248 lbs Starting date: 12/20/2019  Interim History: Stephen Oconnell continues to do with weight loss on his Category 3 plan. He gets good support at home and he feels his hunger is reasonably controlled.  Subjective:   1. Pre-diabetes Stephen Oconnell was due to have labs done today, but his visit was change to telehealth. He is doing very well with diet and weight loss.  Assessment/Plan:   1. Pre-diabetes Stephen Oconnell will continue his Category 3 plan, and will continue to work on weight loss, exercise, and decreasing simple carbohydrates to help decrease the risk of diabetes. We will recheck labs in 2 weeks at his follow up visit. We will refill metformin for 1 month.  - metFORMIN (GLUCOPHAGE) 500 MG tablet; Take 1 tablet (500 mg total) by mouth daily with breakfast.  Dispense: 30 tablet; Refill: 0  2. Class 2 severe obesity with serious comorbidity and body mass index (BMI) of 35.0 to 35.9 in adult, unspecified obesity type [facility]) Stephen Oconnell is currently in the action stage of change. As such, his goal is to continue with weight loss efforts. He has agreed to the Category 3 Plan.    Exercise goals: As is.  Behavioral modification strategies: planning for success.  Stephen Oconnell has agreed to follow-up with our clinic in 2 weeks. He was informed of the importance of frequent follow-up visits to maximize his success with intensive lifestyle modifications for his multiple health conditions.  Objective:   VITALS: Per patient if applicable, see vitals. GENERAL: Alert and in no acute distress. CARDIOPULMONARY: No increased WOB. Speaking in clear sentences.  PSYCH: Pleasant and cooperative. Speech normal rate and rhythm. Affect is appropriate. Insight and judgement are appropriate. Attention is focused, linear, and appropriate.  NEURO: Oriented as arrived to appointment on time with no prompting.   No results found for: CREATININE, BUN, NA, K, CL, CO2 No results found for: ALT, AST, GGT, ALKPHOS, BILITOT Lab Results  Component Value Date   HGBA1C 6.0 (H) 12/20/2019   Lab Results  Component Value Date   INSULIN 29.5 (H) 12/20/2019   Lab Results  Component Value Date   TSH 2.450 12/20/2019   Lab Results  Component Value Date   CHOL 142 12/20/2019   HDL 48 12/20/2019   LDLCALC 80 12/20/2019   TRIG 72 12/20/2019   Lab Results  Component Value Date   WBC 6.0 12/20/2019   HGB 16.6 12/20/2019   HCT 49.4 12/20/2019   MCV 96 12/20/2019   PLT 209 12/20/2019   No results found for: IRON, TIBC, FERRITIN  Attestation Statements:   Reviewed by clinician on day of visit: allergies, medications, problem list, medical history, surgical history, family history, social  history, and previous encounter notes.   I, Trixie Dredge, am acting as transcriptionist for [name], MD.  I have reviewed the above documentation for accuracy and completeness, and I agree with the above. - [name], MD

## 2020-04-08 ENCOUNTER — Encounter (INDEPENDENT_AMBULATORY_CARE_PROVIDER_SITE_OTHER): Payer: Self-pay | Admitting: Family Medicine

## 2020-04-08 ENCOUNTER — Other Ambulatory Visit: Payer: Self-pay

## 2020-04-08 ENCOUNTER — Ambulatory Visit (INDEPENDENT_AMBULATORY_CARE_PROVIDER_SITE_OTHER): Payer: Medicare Other | Admitting: Family Medicine

## 2020-04-08 VITALS — BP 120/67 | HR 66 | Temp 97.7°F | Ht 69.0 in | Wt 233.0 lb

## 2020-04-08 DIAGNOSIS — E669 Obesity, unspecified: Secondary | ICD-10-CM

## 2020-04-08 DIAGNOSIS — R7303 Prediabetes: Secondary | ICD-10-CM | POA: Diagnosis not present

## 2020-04-08 DIAGNOSIS — E559 Vitamin D deficiency, unspecified: Secondary | ICD-10-CM | POA: Diagnosis not present

## 2020-04-08 DIAGNOSIS — I10 Essential (primary) hypertension: Secondary | ICD-10-CM

## 2020-04-08 DIAGNOSIS — Z6834 Body mass index (BMI) 34.0-34.9, adult: Secondary | ICD-10-CM | POA: Diagnosis not present

## 2020-04-08 DIAGNOSIS — E782 Mixed hyperlipidemia: Secondary | ICD-10-CM | POA: Diagnosis not present

## 2020-04-09 LAB — COMPREHENSIVE METABOLIC PANEL
ALT: 23 IU/L (ref 0–44)
AST: 21 IU/L (ref 0–40)
Albumin/Globulin Ratio: 1.6 (ref 1.2–2.2)
Albumin: 4.6 g/dL (ref 3.7–4.7)
Alkaline Phosphatase: 58 IU/L (ref 44–121)
BUN/Creatinine Ratio: 14 (ref 10–24)
BUN: 18 mg/dL (ref 8–27)
Bilirubin Total: 0.6 mg/dL (ref 0.0–1.2)
CO2: 28 mmol/L (ref 20–29)
Calcium: 9.6 mg/dL (ref 8.6–10.2)
Chloride: 100 mmol/L (ref 96–106)
Creatinine, Ser: 1.26 mg/dL (ref 0.76–1.27)
GFR calc Af Amer: 65 mL/min/{1.73_m2} (ref >59)
GFR calc non Af Amer: 57 mL/min/{1.73_m2} — ABNORMAL LOW (ref >59)
Globulin, Total: 2.9 g/dL (ref 1.5–4.5)
Glucose: 107 mg/dL — ABNORMAL HIGH (ref 65–99)
Potassium: 4.6 mmol/L (ref 3.5–5.2)
Sodium: 143 mmol/L (ref 134–144)
Total Protein: 7.5 g/dL (ref 6.0–8.5)

## 2020-04-09 LAB — LIPID PANEL WITH LDL/HDL RATIO
Cholesterol, Total: 91 mg/dL — ABNORMAL LOW (ref 100–199)
HDL: 46 mg/dL (ref >39)
LDL Chol Calc (NIH): 32 mg/dL (ref 0–99)
LDL/HDL Ratio: 0.7 ratio (ref 0.0–3.6)
Triglycerides: 56 mg/dL (ref 0–149)
VLDL Cholesterol Cal: 13 mg/dL (ref 5–40)

## 2020-04-09 LAB — HEMOGLOBIN A1C
Est. average glucose Bld gHb Est-mCnc: 114 mg/dL
Hgb A1c MFr Bld: 5.6 % (ref 4.8–5.6)

## 2020-04-09 LAB — VITAMIN D 25 HYDROXY (VIT D DEFICIENCY, FRACTURES): Vit D, 25-Hydroxy: 72.8 ng/mL (ref 30.0–100.0)

## 2020-04-09 LAB — INSULIN, RANDOM: INSULIN: 17.2 u[IU]/mL (ref 2.6–24.9)

## 2020-04-16 NOTE — Progress Notes (Signed)
Chief Complaint:   OBESITY Stephen Oconnell is here to discuss his progress with his obesity treatment plan along with follow-up of his obesity related diagnoses. Melburn is on the Category 3 Plan and states he is following his eating plan approximately 85% of the time. Lynkin states he is walking for 1-1.5 miles 3-4 times per week.   Today's visit was #: 8 Starting weight: 248 lbs Starting date: 12/20/2019 Today's weight: 233 lbs Today's date: 04/08/2020 Total lbs lost to date: 15 Total lbs lost since last in-office visit: 4  Interim History: Brently continues to do well with weight loss. He is doing well with diet and exercise, and his hunger is mostly controlled. He will be at a convention and he already has plans on how to avoid overeating.  Subjective:   1. Pre-diabetes Trinity continues to work on diet and exercise, and he continues to lose weight which should improve his labs.  2. Vitamin D deficiency Tannon is on Vit D, and he is due to have labs checked to watch for progress and over-replacement.  3. Essential hypertension Hanzel's blood pressure remains well controlled, especially with diet and weight loss.  4. Mixed hyperlipidemia Johari is working on decreasing cholesterol in his diet. He is tolerating Crestor with no mention of myalgias.  Assessment/Plan:   1. Pre-diabetes Ezana will continue to work on weight loss, exercise, and decreasing simple carbohydrates to help decrease the risk of diabetes. We will check labs today.  - Insulin, random - Hemoglobin A1c  2. Vitamin D deficiency Low Vitamin D level contributes to fatigue and are associated with obesity, breast, and colon cancer. We will check labs today. Trase will follow-up for routine testing of Vitamin D, at least 2-3 times per year to avoid over-replacement.  - VITAMIN D 25 Hydroxy (Vit-D Deficiency, Fractures)  3. Essential hypertension Ilias is working on healthy weight loss, diet, and exercise to improve blood pressure  control. We will watch for signs of hypotension as he continues his lifestyle modifications. We will check labs today.  - Comprehensive metabolic panel  4. Mixed hyperlipidemia Cardiovascular risk and specific lipid/LDL goals reviewed. We discussed several lifestyle modifications today and Lorin will continue to work on diet, exercise and weight loss efforts. We will check labs today. Orders and follow up as documented in patient record.   - Lipid Panel With LDL/HDL Ratio  5. Class 1 obesity with serious comorbidity and body mass index (BMI) of 34.0 to 34.9 in adult, unspecified obesity type Jacobey is currently in the action stage of change. As such, his goal is to continue with weight loss efforts. He has agreed to the Category 3 Plan.   Exercise goals: As is.  Behavioral modification strategies: increasing lean protein intake and decreasing simple carbohydrates.  Derell has agreed to follow-up with our clinic in 2 weeks. He was informed of the importance of frequent follow-up visits to maximize his success with intensive lifestyle modifications for his multiple health conditions.   Forney was informed we would discuss his lab results at his next visit unless there is a critical issue that needs to be addressed sooner. Tex agreed to keep his next visit at the agreed upon time to discuss these results.  Objective:   Blood pressure 120/67, pulse 66, temperature 97.7 F (36.5 C), temperature source Oral, height 5\' 9"  (1.753 m), weight 233 lb (105.7 kg), SpO2 98 %. Body mass index is 34.41 kg/m.  General: Cooperative, alert, well developed, in no acute  distress. HEENT: Conjunctivae and lids unremarkable. Cardiovascular: Regular rhythm.  Lungs: Normal work of breathing. Neurologic: No focal deficits.   Lab Results  Component Value Date   CREATININE 1.26 04/08/2020   BUN 18 04/08/2020   NA 143 04/08/2020   K 4.6 04/08/2020   CL 100 04/08/2020   CO2 28 04/08/2020   Lab Results   Component Value Date   ALT 23 04/08/2020   AST 21 04/08/2020   ALKPHOS 58 04/08/2020   BILITOT 0.6 04/08/2020   Lab Results  Component Value Date   HGBA1C 5.6 04/08/2020   HGBA1C 6.0 (H) 12/20/2019   Lab Results  Component Value Date   INSULIN 17.2 04/08/2020   INSULIN 29.5 (H) 12/20/2019   Lab Results  Component Value Date   TSH 2.450 12/20/2019   Lab Results  Component Value Date   CHOL 91 (L) 04/08/2020   HDL 46 04/08/2020   LDLCALC 32 04/08/2020   TRIG 56 04/08/2020   Lab Results  Component Value Date   WBC 6.0 12/20/2019   HGB 16.6 12/20/2019   HCT 49.4 12/20/2019   MCV 96 12/20/2019   PLT 209 12/20/2019   No results found for: IRON, TIBC, FERRITIN  Obesity Behavioral Intervention:   Approximately 15 minutes were spent on the discussion below.  ASK: We discussed the diagnosis of obesity with [name] today and Parnell agreed to give [name] permission to discuss obesity behavioral modification therapy today.  ASSESS: Chanan has the diagnosis of obesity and his BMI today is 34.39. Exzavier is in the action stage of change.   ADVISE: Torien was educated on the multiple health risks of obesity as well as the benefit of weight loss to improve his health. He was advised of the need for long term treatment and the importance of lifestyle modifications to improve his current health and to decrease his risk of future health problems.  AGREE: Multiple dietary modification options and treatment options were discussed and Kazmir agreed to follow the recommendations documented in the above note.  ARRANGE: Keaundre was educated on the importance of frequent visits to treat obesity as outlined per CMS and USPSTF guidelines and agreed to schedule his next follow up appointment today.  Attestation Statements:   Reviewed by clinician on day of visit: allergies, medications, problem list, medical history, surgical history, family history, social history, and previous encounter notes.   I,  Trixie Dredge, am acting as transcriptionist for [name], MD.  I have reviewed the above documentation for accuracy and completeness, and I agree with the above. -  [name], MD

## 2020-04-18 ENCOUNTER — Encounter (INDEPENDENT_AMBULATORY_CARE_PROVIDER_SITE_OTHER): Payer: Self-pay

## 2020-04-18 DIAGNOSIS — M179 Osteoarthritis of knee, unspecified: Secondary | ICD-10-CM | POA: Insufficient documentation

## 2020-04-18 DIAGNOSIS — J309 Allergic rhinitis, unspecified: Secondary | ICD-10-CM | POA: Insufficient documentation

## 2020-04-18 DIAGNOSIS — M25559 Pain in unspecified hip: Secondary | ICD-10-CM | POA: Insufficient documentation

## 2020-04-18 DIAGNOSIS — J3081 Allergic rhinitis due to animal (cat) (dog) hair and dander: Secondary | ICD-10-CM | POA: Insufficient documentation

## 2020-04-18 DIAGNOSIS — M545 Low back pain, unspecified: Secondary | ICD-10-CM | POA: Insufficient documentation

## 2020-04-18 DIAGNOSIS — M1991 Primary osteoarthritis, unspecified site: Secondary | ICD-10-CM | POA: Insufficient documentation

## 2020-04-18 DIAGNOSIS — J4520 Mild intermittent asthma, uncomplicated: Secondary | ICD-10-CM | POA: Insufficient documentation

## 2020-04-18 DIAGNOSIS — J301 Allergic rhinitis due to pollen: Secondary | ICD-10-CM | POA: Insufficient documentation

## 2020-04-18 DIAGNOSIS — M1710 Unilateral primary osteoarthritis, unspecified knee: Secondary | ICD-10-CM | POA: Insufficient documentation

## 2020-04-18 DIAGNOSIS — H1045 Other chronic allergic conjunctivitis: Secondary | ICD-10-CM | POA: Insufficient documentation

## 2020-04-21 ENCOUNTER — Encounter (INDEPENDENT_AMBULATORY_CARE_PROVIDER_SITE_OTHER): Payer: Self-pay | Admitting: Family Medicine

## 2020-04-21 ENCOUNTER — Other Ambulatory Visit: Payer: Self-pay

## 2020-04-21 ENCOUNTER — Ambulatory Visit (INDEPENDENT_AMBULATORY_CARE_PROVIDER_SITE_OTHER): Payer: Medicare Other | Admitting: Family Medicine

## 2020-04-21 VITALS — BP 121/66 | HR 62 | Temp 97.6°F | Ht 69.0 in | Wt 234.0 lb

## 2020-04-21 DIAGNOSIS — E669 Obesity, unspecified: Secondary | ICD-10-CM

## 2020-04-21 DIAGNOSIS — E782 Mixed hyperlipidemia: Secondary | ICD-10-CM | POA: Diagnosis not present

## 2020-04-21 DIAGNOSIS — R7303 Prediabetes: Secondary | ICD-10-CM | POA: Diagnosis not present

## 2020-04-21 DIAGNOSIS — Z6834 Body mass index (BMI) 34.0-34.9, adult: Secondary | ICD-10-CM

## 2020-04-21 DIAGNOSIS — E559 Vitamin D deficiency, unspecified: Secondary | ICD-10-CM

## 2020-04-22 NOTE — Progress Notes (Signed)
Chief Complaint:   OBESITY Stephen Oconnell is here to discuss his progress with his obesity treatment plan along with follow-up of his obesity related diagnoses. Stephen Oconnell is on the Category 3 Plan and states he is following his eating plan approximately 80% of the time. Stephen Oconnell states he is walking for 75-80 minutes 3 times per week.  Today's visit was #: 9 Starting weight: 248 lbs Starting date: 12/20/2019 Today's weight: 234 lbs Today's date: 04/21/2020 Total lbs lost to date: 14 Total lbs lost since last in-office visit: 0  Interim History: Stephen Oconnell is doing well maintaining his weight but he notes increased snacking in the afternoons. He also suspects his protein may be decreased. He is drinking a normal amount of liquids but urine is darker than ideal.  Subjective:   1. Mixed hyperlipidemia Stephen Oconnell's cholesterol is very well controlled on Crestor and diet prescription. I discussed labs with the patient today.  2. Vitamin D deficiency Stephen Oconnell is on Vit D, and his level is still good. He denies nausea, vomiting, or muscle weakness. I discussed labs with the patient today.  3. Pre-diabetes Stephen Oconnell's A1c and insulin has improved with diet and metformin. I discussed labs with the patient today.  Assessment/Plan:   1. Mixed hyperlipidemia Cardiovascular risk and specific lipid/LDL goals reviewed. We discussed several lifestyle modifications today. Stephen Oconnell will continue Crestor, and will continue to work on diet, exercise and weight loss efforts. Will continue to monitor. Orders and follow up as documented in patient record.   2. Vitamin D deficiency Low Vitamin D level contributes to fatigue and are associated with obesity, breast, and colon cancer. Stephen Oconnell will continue taking Vitamin D and watch for signs of over-replacement. He will follow-up for routine testing of Vitamin D, at least 2-3 times per year to avoid over-replacement.  3. Pre-diabetes Stephen Oconnell agreed to change metformin to lunch, and will continue to  work on weight loss, diet, exercise, and decreasing simple carbohydrates to help decrease the risk of diabetes. Will continue to monitor.  4. Class 1 obesity with serious comorbidity and body mass index (BMI) of 34.0 to 34.9 in adult, unspecified obesity type Stephen Oconnell is currently in the action stage of change. As such, his goal is to continue with weight loss efforts. He has agreed to the Category 3 Plan.   Exercise goals: As is.  Behavioral modification strategies: increasing lean protein intake and increasing water intake.  Stephen Oconnell has agreed to follow-up with our clinic in 2 weeks. He was informed of the importance of frequent follow-up visits to maximize his success with intensive lifestyle modifications for his multiple health conditions.   Objective:   Blood pressure 121/66, pulse 62, temperature 97.6 F (36.4 C), height 5\' 9"  (1.753 m), weight 234 lb (106.1 kg), SpO2 97 %. Body mass index is 34.56 kg/m.  General: Cooperative, alert, well developed, in no acute distress. HEENT: Conjunctivae and lids unremarkable. Cardiovascular: Regular rhythm.  Lungs: Normal work of breathing. Neurologic: No focal deficits.   Lab Results  Component Value Date   CREATININE 1.26 04/08/2020   BUN 18 04/08/2020   NA 143 04/08/2020   K 4.6 04/08/2020   CL 100 04/08/2020   CO2 28 04/08/2020   Lab Results  Component Value Date   ALT 23 04/08/2020   AST 21 04/08/2020   ALKPHOS 58 04/08/2020   BILITOT 0.6 04/08/2020   Lab Results  Component Value Date   HGBA1C 5.6 04/08/2020   HGBA1C 6.0 (H) 12/20/2019   Lab Results  Component Value Date   INSULIN 17.2 04/08/2020   INSULIN 29.5 (H) 12/20/2019   Lab Results  Component Value Date   TSH 2.450 12/20/2019   Lab Results  Component Value Date   CHOL 91 (L) 04/08/2020   HDL 46 04/08/2020   LDLCALC 32 04/08/2020   TRIG 56 04/08/2020   Lab Results  Component Value Date   WBC 6.0 12/20/2019   HGB 16.6 12/20/2019   HCT 49.4 12/20/2019    MCV 96 12/20/2019   PLT 209 12/20/2019   No results found for: IRON, TIBC, FERRITIN  Obesity Behavioral Intervention:   Approximately 15 minutes were spent on the discussion below.  ASK: We discussed the diagnosis of obesity with Stephen Oconnell today and Stephen Oconnell agreed to give [name] permission to discuss obesity behavioral modification therapy today.  ASSESS: Stephen Oconnell has the diagnosis of obesity and his BMI today is 34.54. Stephen Oconnell is in the action stage of change.   ADVISE: Stephen Oconnell was educated on the multiple health risks of obesity as well as the benefit of weight loss to improve his health. He was advised of the need for long term treatment and the importance of lifestyle modifications to improve his current health and to decrease his risk of future health problems.  AGREE: Multiple dietary modification options and treatment options were discussed and Stephen Oconnell agreed to follow the recommendations documented in the above note.  ARRANGE: Stephen Oconnell was educated on the importance of frequent visits to treat obesity as outlined per CMS and USPSTF guidelines and agreed to schedule his next follow up appointment today.  Attestation Statements:   Reviewed by clinician on day of visit: allergies, medications, problem list, medical history, surgical history, family history, social history, and previous encounter notes.   I, Trixie Dredge, am acting as transcriptionist for [name], MD.  I have reviewed the above documentation for accuracy and completeness, and I agree with the above. -  [name], MD

## 2020-05-05 ENCOUNTER — Other Ambulatory Visit: Payer: Self-pay

## 2020-05-05 ENCOUNTER — Encounter (INDEPENDENT_AMBULATORY_CARE_PROVIDER_SITE_OTHER): Payer: Self-pay | Admitting: Family Medicine

## 2020-05-05 ENCOUNTER — Ambulatory Visit (INDEPENDENT_AMBULATORY_CARE_PROVIDER_SITE_OTHER): Payer: Medicare Other | Admitting: Family Medicine

## 2020-05-05 VITALS — BP 120/70 | HR 64 | Temp 97.5°F | Ht 69.0 in | Wt 230.0 lb

## 2020-05-05 DIAGNOSIS — Z6834 Body mass index (BMI) 34.0-34.9, adult: Secondary | ICD-10-CM | POA: Diagnosis not present

## 2020-05-05 DIAGNOSIS — I10 Essential (primary) hypertension: Secondary | ICD-10-CM

## 2020-05-05 DIAGNOSIS — E669 Obesity, unspecified: Secondary | ICD-10-CM

## 2020-05-05 DIAGNOSIS — R7303 Prediabetes: Secondary | ICD-10-CM

## 2020-05-05 MED ORDER — METFORMIN HCL 500 MG PO TABS: 500.0000 mg | ORAL_TABLET | Freq: Every day | ORAL | 1 refills | Status: DC

## 2020-05-06 NOTE — Progress Notes (Signed)
Chief Complaint:   OBESITY Stephen Oconnell is here to discuss his progress with his obesity treatment plan along with follow-up of his obesity related diagnoses. Stephen Oconnell is on the Category 3 Plan and states he is following his eating plan approximately 80-85% of the time. Stephen Oconnell states he is walking for 60-75 minutes 3 times per week.  Today's visit was #: 10 Starting weight: 248 lbs Starting date: 12/20/2019 Today's weight: 230 lbs Today's date: 05/05/2020 Total lbs lost to date: 18 Total lbs lost since last in-office visit: 4  Interim History: Stephen Oconnell continues to do well with weight loss. His hunger is mostly controlled and he is doing well with his food choices. He sometimes has cravings for chocolate and pasta but this isn't overwhelming.  Subjective:   1. Pre-diabetes Stephen Oconnell's labs improved with diet, exercise, and metformin. He denies signs of hypoglycemia. I discussed labs with the patient today.  2. Essential hypertension Stephen Oconnell's blood pressure is well controlled with medications and weight loss. He denies signs of hypotension.  Assessment/Plan:   1. Pre-diabetes Stephen Oconnell will continue to work on weight loss, diet, exercise, and decreasing simple carbohydrates to help decrease the risk of diabetes. We will refill metformin for 2 months.  - metFORMIN (GLUCOPHAGE) 500 MG tablet; Take 1 tablet (500 mg total) by mouth daily with breakfast.  Dispense: 30 tablet; Refill: 1  2. Essential hypertension Stephen Oconnell will continue Cozaar and diet, healthy weight loss, and exercise to improve blood pressure control. He is to increase his water intake as tolerated. We will watch for signs of hypotension as he continues his lifestyle modifications.  3. Class 1 obesity with serious comorbidity and body mass index (BMI) of 34.0 to 34.9 in adult, unspecified obesity type Stephen Oconnell is currently in the action stage of change. As such, his goal is to continue with weight loss efforts. He has agreed to the Category 3 Plan.    Exercise goals: As is.  Behavioral modification strategies: increasing lean protein intake, increasing water intake and emotional eating strategies.  Stephen Oconnell has agreed to follow-up with our clinic in 4 weeks. He was informed of the importance of frequent follow-up visits to maximize his success with intensive lifestyle modifications for his multiple health conditions.   Objective:   Blood pressure 120/70, pulse 64, temperature (!) 97.5 F (36.4 C), height 5\' 9"  (1.753 m), weight 230 lb (104.3 kg), SpO2 99 %. Body mass index is 33.97 kg/m.  General: Cooperative, alert, well developed, in no acute distress. HEENT: Conjunctivae and lids unremarkable. Cardiovascular: Regular rhythm.  Lungs: Normal work of breathing. Neurologic: No focal deficits.   Lab Results  Component Value Date   CREATININE 1.26 04/08/2020   BUN 18 04/08/2020   NA 143 04/08/2020   K 4.6 04/08/2020   CL 100 04/08/2020   CO2 28 04/08/2020   Lab Results  Component Value Date   ALT 23 04/08/2020   AST 21 04/08/2020   ALKPHOS 58 04/08/2020   BILITOT 0.6 04/08/2020   Lab Results  Component Value Date   HGBA1C 5.6 04/08/2020   HGBA1C 6.0 (H) 12/20/2019   Lab Results  Component Value Date   INSULIN 17.2 04/08/2020   INSULIN 29.5 (H) 12/20/2019   Lab Results  Component Value Date   TSH 2.450 12/20/2019   Lab Results  Component Value Date   CHOL 91 (L) 04/08/2020   HDL 46 04/08/2020   LDLCALC 32 04/08/2020   TRIG 56 04/08/2020   Lab Results  Component Value  Date   WBC 6.0 12/20/2019   HGB 16.6 12/20/2019   HCT 49.4 12/20/2019   MCV 96 12/20/2019   PLT 209 12/20/2019   No results found for: IRON, TIBC, FERRITIN  Obesity Behavioral Intervention:   Approximately 15 minutes were spent on the discussion below.  ASK: We discussed the diagnosis of obesity with Stephen Oconnell today and Stephen Oconnell agreed to give [name] permission to discuss obesity behavioral modification therapy today.  ASSESS: Stephen Oconnell has the  diagnosis of obesity and his BMI today is 33.95. Stephen Oconnell is in the action stage of change.   ADVISE: Stephen Oconnell was educated on the multiple health risks of obesity as well as the benefit of weight loss to improve his health. He was advised of the need for long term treatment and the importance of lifestyle modifications to improve his current health and to decrease his risk of future health problems.  AGREE: Multiple dietary modification options and treatment options were discussed and Stephen Oconnell agreed to follow the recommendations documented in the above note.  ARRANGE: Stephen Oconnell was educated on the importance of frequent visits to treat obesity as outlined per CMS and USPSTF guidelines and agreed to schedule his next follow up appointment today.  Attestation Statements:   Reviewed by clinician on day of visit: allergies, medications, problem list, medical history, surgical history, family history, social history, and previous encounter notes.   I, Stephen Oconnell, am acting as transcriptionist for [name], MD.  I have reviewed the above documentation for accuracy and completeness, and I agree with the above. -  [name], MD

## 2020-05-19 ENCOUNTER — Other Ambulatory Visit: Payer: Self-pay

## 2020-05-19 ENCOUNTER — Ambulatory Visit (INDEPENDENT_AMBULATORY_CARE_PROVIDER_SITE_OTHER): Payer: Medicare Other | Admitting: Family Medicine

## 2020-05-19 ENCOUNTER — Encounter (INDEPENDENT_AMBULATORY_CARE_PROVIDER_SITE_OTHER): Payer: Self-pay | Admitting: Family Medicine

## 2020-05-19 VITALS — BP 120/63 | HR 68 | Temp 98.1°F | Ht 69.0 in | Wt 232.0 lb

## 2020-05-19 DIAGNOSIS — E669 Obesity, unspecified: Secondary | ICD-10-CM | POA: Diagnosis not present

## 2020-05-19 DIAGNOSIS — Z6834 Body mass index (BMI) 34.0-34.9, adult: Secondary | ICD-10-CM | POA: Diagnosis not present

## 2020-05-19 DIAGNOSIS — R7303 Prediabetes: Secondary | ICD-10-CM

## 2020-05-20 NOTE — Progress Notes (Signed)
Chief Complaint:   OBESITY Stephen Oconnell is here to discuss his progress with his obesity treatment plan along with follow-up of his obesity related diagnoses. Stephen Oconnell is on the Category 3 Plan and states he is following his eating plan approximately 80-85% of the time. Stephen Oconnell states he is walking 60-80 minutes 3-4 times per week.  Today's visit was #: 11 Starting weight: 248 lbs Starting date: 12/20/2019 Today's weight: 232 lbs Today's date: 05/19/2020 Total lbs lost to date: 16 lbs Total lbs lost since last in-office visit:  +2  Interim History: Stephen Oconnell feels he has had a few "treats" over the past few weeks that he shouldn't have. He is up 2 lbs today. He is satisfied with the plan and hunger is satisfied for the most part. He says he eats too many cheese sticks sometimes and goes over on extra calories.   Subjective:   1. Pre-diabetes Stephen Oconnell has decreased to 5.6 on 04/08/2020 from 6.0 on 12/20/2019. He is on Metformin 500 mg QD. He denies polyphagia.  Lab Results  Component Value Date   HGBA1C 5.6 04/08/2020   Lab Results  Component Value Date   INSULIN 17.2 04/08/2020   INSULIN 29.5 (H) 12/20/2019    Assessment/Plan:   1. Pre-diabetes  Continue Metformin and meal plan.  2. Class 1 obesity with serious comorbidity and body mass index (BMI) of 34.0 to 34.9 in adult, unspecified obesity type Stephen Oconnell is currently in the action stage of change. As such, his goal is to continue with weight loss efforts. He has agreed to the Category 3 Plan.   We discussed adding resistance training 2 times a week, while maintaining cardio at least 150 minutes a week.  Exercise goals: Add resistance training 2 times a week   Behavioral modification strategies: planning for success.  Stephen Oconnell has agreed to follow-up with our clinic in 2 weeks with Stephen Oconnell.  Objective:   Blood pressure 120/63, pulse 68, temperature 98.1 F (36.7 C), height 5\' 9"  (1.753 m), weight 232 lb (105.2 kg), SpO2 97 %. Body  mass index is 34.26 kg/m.  General: Cooperative, alert, well developed, in no acute distress. HEENT: Conjunctivae and lids unremarkable. Cardiovascular: Regular rhythm.  Lungs: Normal work of breathing. Neurologic: No focal deficits.   Lab Results  Component Value Date   CREATININE 1.26 04/08/2020   BUN 18 04/08/2020   NA 143 04/08/2020   K 4.6 04/08/2020   CL 100 04/08/2020   CO2 28 04/08/2020   Lab Results  Component Value Date   ALT 23 04/08/2020   AST 21 04/08/2020   ALKPHOS 58 04/08/2020   BILITOT 0.6 04/08/2020   Lab Results  Component Value Date   HGBA1C 5.6 04/08/2020   HGBA1C 6.0 (H) 12/20/2019   Lab Results  Component Value Date   INSULIN 17.2 04/08/2020   INSULIN 29.5 (H) 12/20/2019   Lab Results  Component Value Date   TSH 2.450 12/20/2019   Lab Results  Component Value Date   CHOL 91 (L) 04/08/2020   HDL 46 04/08/2020   LDLCALC 32 04/08/2020   TRIG 56 04/08/2020   Lab Results  Component Value Date   WBC 6.0 12/20/2019   HGB 16.6 12/20/2019   HCT 49.4 12/20/2019   MCV 96 12/20/2019   PLT 209 12/20/2019   No results found for: IRON, TIBC, FERRITIN  Obesity Behavioral Intervention:   Approximately 15 minutes were spent on the discussion below.  ASK: We discussed the diagnosis of obesity  with Stephen Oconnell today and Stephen Oconnell agreed to give [name] permission to discuss obesity behavioral modification therapy today.  ASSESS: Stephen Oconnell has the diagnosis of obesity and his BMI today is 34.3. Stephen Oconnell is in the action stage of change.   ADVISE: Stephen Oconnell was educated on the multiple health risks of obesity as well as the benefit of weight loss to improve his health. He was advised of the need for long term treatment and the importance of lifestyle modifications to improve his current health and to decrease his risk of future health problems.  AGREE: Multiple dietary modification options and treatment options were discussed and Stephen Oconnell agreed to follow the recommendations  documented in the above note.  ARRANGE: Stephen Oconnell was educated on the importance of frequent visits to treat obesity as outlined per CMS and USPSTF guidelines and agreed to schedule his next follow up appointment today.  Attestation Statements:   Reviewed by clinician on day of visit: allergies, medications, problem list, medical history, surgical history, family history, social history, and previous encounter notes.  Coral Ceo, am acting as Location manager for [employer], [place].  I have reviewed the above documentation for accuracy and completeness, and I agree with the above. -  Georgianne Fick, FNP

## 2020-05-21 ENCOUNTER — Encounter (INDEPENDENT_AMBULATORY_CARE_PROVIDER_SITE_OTHER): Payer: Self-pay | Admitting: Family Medicine

## 2020-05-21 DIAGNOSIS — R7303 Prediabetes: Secondary | ICD-10-CM | POA: Insufficient documentation

## 2020-05-21 DIAGNOSIS — E669 Obesity, unspecified: Secondary | ICD-10-CM | POA: Insufficient documentation

## 2020-06-05 ENCOUNTER — Ambulatory Visit (INDEPENDENT_AMBULATORY_CARE_PROVIDER_SITE_OTHER): Payer: Medicare Other | Admitting: Family Medicine

## 2020-06-05 ENCOUNTER — Other Ambulatory Visit: Payer: Self-pay

## 2020-06-05 ENCOUNTER — Encounter (INDEPENDENT_AMBULATORY_CARE_PROVIDER_SITE_OTHER): Payer: Self-pay | Admitting: Family Medicine

## 2020-06-05 VITALS — BP 122/68 | HR 65 | Temp 97.9°F | Ht 69.0 in | Wt 228.0 lb

## 2020-06-05 DIAGNOSIS — E7849 Other hyperlipidemia: Secondary | ICD-10-CM | POA: Diagnosis not present

## 2020-06-05 DIAGNOSIS — Z6836 Body mass index (BMI) 36.0-36.9, adult: Secondary | ICD-10-CM | POA: Diagnosis not present

## 2020-06-05 DIAGNOSIS — R7303 Prediabetes: Secondary | ICD-10-CM

## 2020-06-05 MED ORDER — METFORMIN HCL 500 MG PO TABS: 500.0000 mg | ORAL_TABLET | Freq: Two times a day (BID) | ORAL | 0 refills | Status: DC

## 2020-06-11 NOTE — Progress Notes (Signed)
Chief Complaint:   OBESITY Stephen Oconnell is here to discuss his progress with his obesity treatment plan along with follow-up of his obesity related diagnoses. [name] is on the Category 3 Plan and states he is following his eating plan approximately 85-90% of the time. [place] states he is walking for 60-90 minutes 3-4 times per week.  Today's visit was #: 12 Starting weight: 248 lbs Starting date: 12/20/2019 Today's weight: 228 lbs Today's date: 06/05/2020 Total lbs lost to date: 20 Total lbs lost since last in-office visit: 4  Interim History: Stephen Oconnell continues to do well with weight loss on his plan. He states his wife has questions about how to concentrate on decreasing visceral fat.  Subjective:   1. Pre-diabetes Stephen Oconnell is doing well with diet. He was educated on insulin resistance, and how this affects visceral fat. He is tolerating metformin well and he denies nausea or vomiting.  2. Other hyperlipidemia Stephen Oconnell continues to do well with diet and weight loss. He is stable on Crestor.  Assessment/Plan:   1. Pre-diabetes Stephen Oconnell agreed to increase metformin to 500 mg BID with meals, with no refills. He will continue to work on weight loss, diet, exercise, and decreasing simple carbohydrates to help decrease the risk of diabetes.   - metFORMIN (GLUCOPHAGE) 500 MG tablet; Take 1 tablet (500 mg total) by mouth 2 (two) times daily with a meal.  Dispense: 60 tablet; Refill: 0  2. Other hyperlipidemia Cardiovascular risk and specific lipid/LDL goals reviewed. We discussed several lifestyle modifications today. Stephen Oconnell will continue Crestor, and will continue to work on diet, exercise and weight loss efforts. Orders and follow up as documented in patient record.   3. Obesity with current BMI of 33.7 Stephen Oconnell is currently in the action stage of change. As such, his goal is to continue with weight loss efforts. He has agreed to the Category 3 Plan.   Stephen Oconnell was educated on diet and exercise, and visceral fat,  and we will continue to monitor.  Exercise goals: As is.  Behavioral modification strategies: decreasing simple carbohydrates.  Stephen Oconnell has agreed to follow-up with our clinic in 4 weeks. He was informed of the importance of frequent follow-up visits to maximize his success with intensive lifestyle modifications for his multiple health conditions.   Objective:   Blood pressure 122/68, pulse 65, temperature 97.9 F (36.6 C), height 5\' 9"  (1.753 m), weight 228 lb (103.4 kg), SpO2 99 %. Body mass index is 33.67 kg/m.  General: Cooperative, alert, well developed, in no acute distress. HEENT: Conjunctivae and lids unremarkable. Cardiovascular: Regular rhythm.  Lungs: Normal work of breathing. Neurologic: No focal deficits.   Lab Results  Component Value Date   CREATININE 1.26 04/08/2020   BUN 18 04/08/2020   NA 143 04/08/2020   K 4.6 04/08/2020   CL 100 04/08/2020   CO2 28 04/08/2020   Lab Results  Component Value Date   ALT 23 04/08/2020   AST 21 04/08/2020   ALKPHOS 58 04/08/2020   BILITOT 0.6 04/08/2020   Lab Results  Component Value Date   HGBA1C 5.6 04/08/2020   HGBA1C 6.0 (H) 12/20/2019   Lab Results  Component Value Date   INSULIN 17.2 04/08/2020   INSULIN 29.5 (H) 12/20/2019   Lab Results  Component Value Date   TSH 2.450 12/20/2019   Lab Results  Component Value Date   CHOL 91 (L) 04/08/2020   HDL 46 04/08/2020   LDLCALC 32 04/08/2020   TRIG 56 04/08/2020  Lab Results  Component Value Date   WBC 6.0 12/20/2019   HGB 16.6 12/20/2019   HCT 49.4 12/20/2019   MCV 96 12/20/2019   PLT 209 12/20/2019   No results found for: IRON, TIBC, FERRITIN  Obesity Behavioral Intervention:   Approximately 15 minutes were spent on the discussion below.  ASK: We discussed the diagnosis of obesity with Stephen Oconnell today and Stephen Oconnell agreed to give Stephen Oconnell permission to discuss obesity behavioral modification therapy today.  ASSESS: Stephen Oconnell has the diagnosis of obesity and his BMI  today is 33.65. Stephen Oconnell is in the action stage of change.   ADVISE: Stephen Oconnell was educated on the multiple health risks of obesity as well as the benefit of weight loss to improve his health. He was advised of the need for long term treatment and the importance of lifestyle modifications to improve his current health and to decrease his risk of future health problems.  AGREE: Multiple dietary modification options and treatment options were discussed and Stephen Oconnell agreed to follow the recommendations documented in the above note.  ARRANGE: Stephen Oconnell was educated on the importance of frequent visits to treat obesity as outlined per CMS and USPSTF guidelines and agreed to schedule his next follow up appointment today.  Attestation Statements:   Reviewed by clinician on day of visit: allergies, medications, problem list, medical history, surgical history, family history, social history, and previous encounter notes.   I, Trixie Dredge, am acting as transcriptionist for [name], MD.  I have reviewed the above documentation for accuracy and completeness, and I agree with the above. -  [name], MD

## 2020-06-17 ENCOUNTER — Encounter (INDEPENDENT_AMBULATORY_CARE_PROVIDER_SITE_OTHER): Payer: Self-pay | Admitting: Family Medicine

## 2020-06-17 ENCOUNTER — Ambulatory Visit (INDEPENDENT_AMBULATORY_CARE_PROVIDER_SITE_OTHER): Payer: Medicare Other | Admitting: Family Medicine

## 2020-06-17 ENCOUNTER — Other Ambulatory Visit: Payer: Self-pay

## 2020-06-17 VITALS — BP 130/63 | HR 70 | Temp 98.0°F | Ht 69.0 in | Wt 229.0 lb

## 2020-06-17 DIAGNOSIS — J3089 Other allergic rhinitis: Secondary | ICD-10-CM | POA: Diagnosis not present

## 2020-06-17 DIAGNOSIS — K5909 Other constipation: Secondary | ICD-10-CM

## 2020-06-17 DIAGNOSIS — Z6836 Body mass index (BMI) 36.0-36.9, adult: Secondary | ICD-10-CM

## 2020-06-17 DIAGNOSIS — R7303 Prediabetes: Secondary | ICD-10-CM | POA: Diagnosis not present

## 2020-06-25 NOTE — Progress Notes (Signed)
Chief Complaint:   OBESITY Stephen Oconnell is here to discuss his progress with his obesity treatment plan along with follow-up of his obesity related diagnoses. Stephen Oconnell is on the Category 3 Plan and states he is following his eating plan approximately 80% of the time. Stephen Oconnell states he is walking for 80 minutes 4 times per week.  Today's visit was #: 13 Starting weight: 248 lbs Starting date: 12/20/2019 Today's weight: 229 lbs Today's date: 06/17/2020 Total lbs lost to date: 19 Total lbs lost since last in-office visit: 0  Interim History: Stephen Oconnell has been tempted with chocolate in the house for the grandkids. He feels this has contributed to his small weight gain. He is walking for exercise except the pollen is bothering him especially this year.  Subjective:   1. Pre-diabetes Stephen Oconnell increased metformin to BID and he is tolerating this well. He denies GI upset.  2. Other constipation Stephen Oconnell notes increased constipation. His BM are less frequent but sometimes hard and painful.  3. Allergic rhinitis due to other allergic trigger, unspecified seasonality Stephen Oconnell notes worsening symptoms this Spring, but not completely controlled on Claritin.  Assessment/Plan:   1. Pre-diabetes Stephen Oconnell will continue metformin BID, and will continue to work on weight loss, exercise, and decreasing simple carbohydrates to help decrease the risk of diabetes.   2. Other constipation Stephen Oconnell was informed that a decrease in bowel movement frequency is normal while losing weight, but stools should not be hard or painful. Benz agreed to start miralax OTC 17 grams daily and increase his water intake.Orders and follow up as documented in patient record.   3. Allergic rhinitis due to other allergic trigger, unspecified seasonality Stephen Oconnell was advised that antihistamines can contribute to weight gain, and he was advised to change to OTC Flonase instead.  4. Obesity with current BMI of 33.8 Stephen Oconnell is currently in the action stage of change.  As such, his goal is to continue with weight loss efforts. He has agreed to the Category 3 Plan.   Exercise goals: As is.  Behavioral modification strategies: increasing lean protein intake and meal planning and cooking strategies.  Stephen Oconnell has agreed to follow-up with our clinic in 2 weeks. He was informed of the importance of frequent follow-up visits to maximize his success with intensive lifestyle modifications for his multiple health conditions.   Objective:   Blood pressure 130/63, pulse 70, temperature 98 F (36.7 C), height 5\' 9"  (1.753 m), weight 229 lb (103.9 kg), SpO2 100 %. Body mass index is 33.82 kg/m.  General: Cooperative, alert, well developed, in no acute distress. HEENT: Conjunctivae and lids unremarkable. Cardiovascular: Regular rhythm.  Lungs: Normal work of breathing. Neurologic: No focal deficits.   Lab Results  Component Value Date   CREATININE 1.26 04/08/2020   BUN 18 04/08/2020   NA 143 04/08/2020   K 4.6 04/08/2020   CL 100 04/08/2020   CO2 28 04/08/2020   Lab Results  Component Value Date   ALT 23 04/08/2020   AST 21 04/08/2020   ALKPHOS 58 04/08/2020   BILITOT 0.6 04/08/2020   Lab Results  Component Value Date   HGBA1C 5.6 04/08/2020   HGBA1C 6.0 (H) 12/20/2019   Lab Results  Component Value Date   INSULIN 17.2 04/08/2020   INSULIN 29.5 (H) 12/20/2019   Lab Results  Component Value Date   TSH 2.450 12/20/2019   Lab Results  Component Value Date   CHOL 91 (L) 04/08/2020   HDL 46 04/08/2020  LDLCALC 32 04/08/2020   TRIG 56 04/08/2020   Lab Results  Component Value Date   WBC 6.0 12/20/2019   HGB 16.6 12/20/2019   HCT 49.4 12/20/2019   MCV 96 12/20/2019   PLT 209 12/20/2019   No results found for: IRON, TIBC, FERRITIN  Attestation Statements:   Reviewed by clinician on day of visit: allergies, medications, problem list, medical history, surgical history, family history, social history, and previous encounter notes.  Time  spent on visit including pre-visit chart review and post-visit care and charting was 30 minutes.    I, Trixie Dredge, am acting as transcriptionist for [name], MD.  I have reviewed the above documentation for accuracy and completeness, and I agree with the above. -  [name], MD

## 2020-07-07 ENCOUNTER — Ambulatory Visit (INDEPENDENT_AMBULATORY_CARE_PROVIDER_SITE_OTHER): Payer: Medicare Other | Admitting: Family Medicine

## 2020-07-07 ENCOUNTER — Encounter (INDEPENDENT_AMBULATORY_CARE_PROVIDER_SITE_OTHER): Payer: Self-pay | Admitting: Family Medicine

## 2020-07-07 ENCOUNTER — Other Ambulatory Visit: Payer: Self-pay

## 2020-07-07 VITALS — BP 126/65 | HR 67 | Temp 98.2°F | Ht 69.0 in | Wt 226.0 lb

## 2020-07-07 DIAGNOSIS — Z6836 Body mass index (BMI) 36.0-36.9, adult: Secondary | ICD-10-CM | POA: Diagnosis not present

## 2020-07-07 DIAGNOSIS — R7303 Prediabetes: Secondary | ICD-10-CM | POA: Diagnosis not present

## 2020-07-08 NOTE — Progress Notes (Signed)
Chief Complaint:   OBESITY Stephen Oconnell is here to discuss his progress with his obesity treatment plan along with follow-up of his obesity related diagnoses. Stephen Oconnell is on the Category 3 Plan and states he is following his eating plan approximately 80-85% of the time. Stephen Oconnell states he is walking for 120 minutes 3-4 times per week.  Today's visit was #: 14 Starting weight: 248 lbs Starting date: 12/20/2019 Today's weight: 226 lbs Today's date: 07/07/2020 Total lbs lost to date: 22 Total lbs lost since last in-office visit: 3  Interim History: Stephen Oconnell continues to do well with weight loss on his Category 3 plan. His hunger is mostly controlled and he is exercising regularly. He is getting good family support.  Subjective:   1. Pre-diabetes Stephen Oconnell continues to do well with weight loss. His visceral fat rating continues to decrease which will decrease his risk of diabetes mellitus. He denies nausea, vomiting, or hypoglycemia.   Assessment/Plan:   1. Pre-diabetes Stephen Oconnell will continue metformin as is, and will continue to monitor. He will continue to work on weight loss, exercise, and decreasing simple carbohydrates to help decrease the risk of diabetes.   2. Obesity with current BMI 33.5 Stephen Oconnell is currently in the action stage of change. As such, his goal is to continue with weight loss efforts. He has agreed to the Category 3 Plan.   Exercise goals: As is.  Behavioral modification strategies: meal planning and cooking strategies.  Stephen Oconnell has agreed to follow-up with our clinic in 4 weeks. He was informed of the importance of frequent follow-up visits to maximize his success with intensive lifestyle modifications for his multiple health conditions.   Objective:   Blood pressure 126/65, pulse 67, temperature 98.2 F (36.8 C), height 5\' 9"  (1.753 m), weight 226 lb (102.5 kg), SpO2 97 %. Body mass index is 33.37 kg/m.  General: Cooperative, alert, well developed, in no acute distress. HEENT:  Conjunctivae and lids unremarkable. Cardiovascular: Regular rhythm.  Lungs: Normal work of breathing. Neurologic: No focal deficits.   Lab Results  Component Value Date   CREATININE 1.26 04/08/2020   BUN 18 04/08/2020   NA 143 04/08/2020   K 4.6 04/08/2020   CL 100 04/08/2020   CO2 28 04/08/2020   Lab Results  Component Value Date   ALT 23 04/08/2020   AST 21 04/08/2020   ALKPHOS 58 04/08/2020   BILITOT 0.6 04/08/2020   Lab Results  Component Value Date   HGBA1C 5.6 04/08/2020   HGBA1C 6.0 (H) 12/20/2019   Lab Results  Component Value Date   INSULIN 17.2 04/08/2020   INSULIN 29.5 (H) 12/20/2019   Lab Results  Component Value Date   TSH 2.450 12/20/2019   Lab Results  Component Value Date   CHOL 91 (L) 04/08/2020   HDL 46 04/08/2020   LDLCALC 32 04/08/2020   TRIG 56 04/08/2020   Lab Results  Component Value Date   WBC 6.0 12/20/2019   HGB 16.6 12/20/2019   HCT 49.4 12/20/2019   MCV 96 12/20/2019   PLT 209 12/20/2019   No results found for: IRON, TIBC, FERRITIN  Obesity Behavioral Intervention:   Approximately 15 minutes were spent on the discussion below.  ASK: We discussed the diagnosis of obesity with Stephen Oconnell today and Stephen Oconnell agreed to give [name] permission to discuss obesity behavioral modification therapy today.  ASSESS: Stephen Oconnell has the diagnosis of obesity and his BMI today is 33.36. Stephen Oconnell is in the action stage of change.  ADVISE: Stephen Oconnell was educated on the multiple health risks of obesity as well as the benefit of weight loss to improve his health. He was advised of the need for long term treatment and the importance of lifestyle modifications to improve his current health and to decrease his risk of future health problems.  AGREE: Multiple dietary modification options and treatment options were discussed and Stephen Oconnell agreed to follow the recommendations documented in the above note.  ARRANGE: Stephen Oconnell was educated on the importance of frequent visits to treat  obesity as outlined per CMS and USPSTF guidelines and agreed to schedule his next follow up appointment today.  Attestation Statements:   Reviewed by clinician on day of visit: allergies, medications, problem list, medical history, surgical history, family history, social history, and previous encounter notes.   I, Stephen Oconnell, am acting as transcriptionist for [name], MD.  I have reviewed the above documentation for accuracy and completeness, and I agree with the above. -  [name], MD

## 2020-07-18 ENCOUNTER — Other Ambulatory Visit (INDEPENDENT_AMBULATORY_CARE_PROVIDER_SITE_OTHER): Payer: Self-pay | Admitting: Family Medicine

## 2020-07-18 DIAGNOSIS — R7303 Prediabetes: Secondary | ICD-10-CM

## 2020-07-21 ENCOUNTER — Ambulatory Visit (INDEPENDENT_AMBULATORY_CARE_PROVIDER_SITE_OTHER): Payer: Medicare Other | Admitting: Family Medicine

## 2020-07-21 NOTE — Telephone Encounter (Signed)
Patient is requesting a refill of the following medications: Requested Prescriptions   Pending Prescriptions Disp Refills   metFORMIN (GLUCOPHAGE) 500 MG tablet [Pharmacy Med Name: METFORMIN 500MG  TABLETS] 180 tablet     Sig: TAKE 1 TABLET(500 MG) BY MOUTH TWICE DAILY WITH A MEAL     Last office visit: 07/07/20 Date of last refill:06/05/20 Last refill amount: 60 Follow up time period per chart: 4 week Next scheduled appt: 08/07/20

## 2020-07-21 NOTE — Telephone Encounter (Signed)
Pt last seen by Dr. Beasley.  

## 2020-07-21 NOTE — Telephone Encounter (Signed)
Ok x 1

## 2020-08-07 ENCOUNTER — Other Ambulatory Visit: Payer: Self-pay

## 2020-08-07 ENCOUNTER — Ambulatory Visit (INDEPENDENT_AMBULATORY_CARE_PROVIDER_SITE_OTHER): Payer: Medicare Other | Admitting: Family Medicine

## 2020-08-07 VITALS — BP 126/64 | HR 60 | Temp 97.7°F | Ht 69.0 in | Wt 228.0 lb

## 2020-08-07 DIAGNOSIS — R42 Dizziness and giddiness: Secondary | ICD-10-CM | POA: Diagnosis not present

## 2020-08-07 DIAGNOSIS — Z6836 Body mass index (BMI) 36.0-36.9, adult: Secondary | ICD-10-CM | POA: Diagnosis not present

## 2020-08-07 DIAGNOSIS — I10 Essential (primary) hypertension: Secondary | ICD-10-CM | POA: Diagnosis not present

## 2020-08-13 NOTE — Progress Notes (Signed)
Chief Complaint:   OBESITY Stephen Oconnell is here to discuss his progress with his obesity treatment plan along with follow-up of his obesity related diagnoses. Stephen Oconnell is on the Category 3 Plan and states he is following his eating plan approximately 80% of the time. Stephen Oconnell states he is walking for 1 hour and 20 minutes 4 times per week.  Today's visit was #: 15 Starting weight: 248 lbs Starting date: 12/20/2019 Today's weight: 228 lbs Today's date: 08/07/2020 Total lbs lost to date: 20 Total lbs lost since last in-office visit: 0  Interim History: Stephen Oconnell is retaining fluid today. His doing well on his plan overall, but he may not be eating all of his protein. He is happy with his plan overall.  Subjective:   1. Essential hypertension Stephen Oconnell's blood pressure is stable on his medications. He is working on diet and weight loss.  2. Dizziness Stephen Oconnell is not having positional dizziness, but he notes occasional mild loss of balance. He is doing more walking outdoors in the 90+ degree heat. He denies falls or loss of consciousness.   Assessment/Plan:   1. Essential hypertension Bode will continue healthy weight loss, diet, and exercise to improve blood pressure control. We will watch for signs of hypotension as he continues his lifestyle modifications.  2. Dizziness Stephen Oconnell is to increase his water intake and continue to monitor. If he continue to fail to improve or if it worsens, he is to discuss with his primary care physician or Ears, Nose, and Throat specialist.  3. Obesity with current BMI  Stephen Oconnell is currently in the action stage of change. As such, his goal is to continue with weight loss efforts. He has agreed to the Category 3 Plan.   Exercise goals: As is.  Behavioral modification strategies: increasing lean protein intake and increasing water intake.  Stephen Oconnell has agreed to follow-up with our clinic in 2 weeks. He was informed of the importance of frequent follow-up visits to maximize his success  with intensive lifestyle modifications for his multiple health conditions.   Objective:   Blood pressure 126/64, pulse 60, temperature 97.7 F (36.5 C), height 5\' 9"  (1.753 m), weight 228 lb (103.4 kg), SpO2 97 %. Body mass index is 33.67 kg/m.  General: Cooperative, alert, well developed, in no acute distress. HEENT: Conjunctivae and lids unremarkable. Cardiovascular: Regular rhythm.  Lungs: Normal work of breathing. Neurologic: No focal deficits.   Lab Results  Component Value Date   CREATININE 1.26 04/08/2020   BUN 18 04/08/2020   NA 143 04/08/2020   K 4.6 04/08/2020   CL 100 04/08/2020   CO2 28 04/08/2020   Lab Results  Component Value Date   ALT 23 04/08/2020   AST 21 04/08/2020   ALKPHOS 58 04/08/2020   BILITOT 0.6 04/08/2020   Lab Results  Component Value Date   HGBA1C 5.6 04/08/2020   HGBA1C 6.0 (H) 12/20/2019   Lab Results  Component Value Date   INSULIN 17.2 04/08/2020   INSULIN 29.5 (H) 12/20/2019   Lab Results  Component Value Date   TSH 2.450 12/20/2019   Lab Results  Component Value Date   CHOL 91 (L) 04/08/2020   HDL 46 04/08/2020   LDLCALC 32 04/08/2020   TRIG 56 04/08/2020   Lab Results  Component Value Date   WBC 6.0 12/20/2019   HGB 16.6 12/20/2019   HCT 49.4 12/20/2019   MCV 96 12/20/2019   PLT 209 12/20/2019   No results found for: IRON, TIBC,  FERRITIN  Attestation Statements:   Reviewed by clinician on day of visit: allergies, medications, problem list, medical history, surgical history, family history, social history, and previous encounter notes.  Time spent on visit including pre-visit chart review and post-visit care and charting was 30 minutes.    I, Trixie Dredge, am acting as transcriptionist for [name], MD.  I have reviewed the above documentation for accuracy and completeness, and I agree with the above. -  [name], MD

## 2020-08-25 ENCOUNTER — Other Ambulatory Visit: Payer: Self-pay

## 2020-08-25 ENCOUNTER — Encounter (INDEPENDENT_AMBULATORY_CARE_PROVIDER_SITE_OTHER): Payer: Self-pay | Admitting: Family Medicine

## 2020-08-25 ENCOUNTER — Ambulatory Visit (INDEPENDENT_AMBULATORY_CARE_PROVIDER_SITE_OTHER): Payer: Medicare Other | Admitting: Family Medicine

## 2020-08-25 VITALS — BP 134/71 | HR 67 | Temp 98.0°F | Ht 69.0 in | Wt 228.0 lb

## 2020-08-25 DIAGNOSIS — R609 Edema, unspecified: Secondary | ICD-10-CM

## 2020-08-25 DIAGNOSIS — Z6836 Body mass index (BMI) 36.0-36.9, adult: Secondary | ICD-10-CM | POA: Diagnosis not present

## 2020-08-26 NOTE — Progress Notes (Signed)
Chief Complaint:   OBESITY Raydel is here to discuss his progress with his obesity treatment plan along with follow-up of his obesity related diagnoses. Jakyle is on the Category 3 Plan and states he is following his eating plan approximately 85-90% of the time. Daron states he is walking for 60-90 minutes 4 times per week.  Today's visit was #: 9 Starting weight: 248 lbs Starting date: 12/20/2019 Today's weight: 228 lbs Today's date: 08/25/2020 Total lbs lost to date: 20 Total lbs lost since last in-office visit: 0  Interim History: Banks continues to do well with weight loss. He is walking more outdoors and trying to stay active. He states his hunger is controlled and he is hapy with his meal plan.  Subjective:   1. Edema, unspecified type Jabreel notes bilateral lower extremity edema, worse with increased heat. He generally doesn't eat much sodium in his diet.  Assessment/Plan:   1. Edema, unspecified type Lenus will continue to increase his water intake to help increase intravascular hydration and help decrease extravascular edema. We will continue to monitor. His goal water intake is 80 oz to start.  2. Obesity with current BMI 33.8 Chris is currently in the action stage of change. As such, his goal is to continue with weight loss efforts. He has agreed to the Category 3 Plan.   We will recheck fasting labs at his next visit.  Exercise goals: As is.  Behavioral modification strategies: increasing lean protein intake and increasing water intake.  Rydan has agreed to follow-up with our clinic in 3 to 4 weeks. He was informed of the importance of frequent follow-up visits to maximize his success with intensive lifestyle modifications for his multiple health conditions.   Objective:   Blood pressure 134/71, pulse 67, temperature 98 F (36.7 C), height 5\' 9"  (1.753 m), weight 228 lb (103.4 kg), SpO2 97 %. Body mass index is 33.67 kg/m.  General: Cooperative, alert, well  developed, in no acute distress. HEENT: Conjunctivae and lids unremarkable. Cardiovascular: Regular rhythm.  Lungs: Normal work of breathing. Neurologic: No focal deficits.   Lab Results  Component Value Date   CREATININE 1.26 04/08/2020   BUN 18 04/08/2020   NA 143 04/08/2020   K 4.6 04/08/2020   CL 100 04/08/2020   CO2 28 04/08/2020   Lab Results  Component Value Date   ALT 23 04/08/2020   AST 21 04/08/2020   ALKPHOS 58 04/08/2020   BILITOT 0.6 04/08/2020   Lab Results  Component Value Date   HGBA1C 5.6 04/08/2020   HGBA1C 6.0 (H) 12/20/2019   Lab Results  Component Value Date   INSULIN 17.2 04/08/2020   INSULIN 29.5 (H) 12/20/2019   Lab Results  Component Value Date   TSH 2.450 12/20/2019   Lab Results  Component Value Date   CHOL 91 (L) 04/08/2020   HDL 46 04/08/2020   LDLCALC 32 04/08/2020   TRIG 56 04/08/2020   Lab Results  Component Value Date   WBC 6.0 12/20/2019   HGB 16.6 12/20/2019   HCT 49.4 12/20/2019   MCV 96 12/20/2019   PLT 209 12/20/2019   No results found for: IRON, TIBC, FERRITIN  Attestation Statements:   Reviewed by clinician on day of visit: allergies, medications, problem list, medical history, surgical history, family history, social history, and previous encounter notes.  Time spent on visit including pre-visit chart review and post-visit care and charting was 20 minutes.    Wilhemena Durie, am acting  as transcriptionist for [name], MD.  I have reviewed the above documentation for accuracy and completeness, and I agree with the above. -  [name], MD

## 2020-09-14 ENCOUNTER — Emergency Department (HOSPITAL_BASED_OUTPATIENT_CLINIC_OR_DEPARTMENT_OTHER)
Admission: EM | Admit: 2020-09-14 | Discharge: 2020-09-14 | Disposition: A | Discharge status: Home / Self Care | Payer: Medicare Other | Attending: Emergency Medicine | Admitting: Emergency Medicine

## 2020-09-14 ENCOUNTER — Other Ambulatory Visit: Payer: Self-pay

## 2020-09-14 DIAGNOSIS — J4520 Mild intermittent asthma, uncomplicated: Secondary | ICD-10-CM | POA: Insufficient documentation

## 2020-09-14 DIAGNOSIS — R7303 Prediabetes: Secondary | ICD-10-CM | POA: Diagnosis not present

## 2020-09-14 DIAGNOSIS — K029 Dental caries, unspecified: Secondary | ICD-10-CM

## 2020-09-14 DIAGNOSIS — Z79899 Other long term (current) drug therapy: Secondary | ICD-10-CM | POA: Insufficient documentation

## 2020-09-14 DIAGNOSIS — J449 Chronic obstructive pulmonary disease, unspecified: Secondary | ICD-10-CM | POA: Insufficient documentation

## 2020-09-14 DIAGNOSIS — I10 Essential (primary) hypertension: Secondary | ICD-10-CM | POA: Diagnosis not present

## 2020-09-14 DIAGNOSIS — Z7984 Long term (current) use of oral hypoglycemic drugs: Secondary | ICD-10-CM | POA: Diagnosis not present

## 2020-09-14 DIAGNOSIS — K0889 Other specified disorders of teeth and supporting structures: Secondary | ICD-10-CM | POA: Insufficient documentation

## 2020-09-14 DIAGNOSIS — Z7982 Long term (current) use of aspirin: Secondary | ICD-10-CM | POA: Insufficient documentation

## 2020-09-14 MED ORDER — OXYCODONE-ACETAMINOPHEN 5-325 MG PO TABS: 1.0000 | ORAL_TABLET | Freq: Four times a day (QID) | ORAL | 0 refills | Status: DC | PRN

## 2020-09-14 NOTE — ED Triage Notes (Signed)
PT to ED from home with c/o right sided dental pain that began a few days ago but has gotten progressively worse over the past few hours. Pt states that it is radiating into his right ear and causing a severe headache. Pt states none of his at home treatments have relieved the pain.

## 2020-09-14 NOTE — ED Provider Notes (Signed)
[facility] DEPT Provider Note   CSN: [id number] Arrival date & time: 09/14/20  1912     History Chief Complaint  Patient presents with   Dental Pain    Stephen Oconnell is a 75 y.o. male.  75 year old male presents with 1 day history of right lower jaw pain.  States the pain is at his right lower third molar.  No fever or chills.  Has used over-the-counter medications without relief      Past Medical History:  Diagnosis Date   Allergies    Asthma    on inhaler   Constipation    COPD (chronic obstructive pulmonary disease) (HCC)    Emphysema lung (HCC)    GERD (gastroesophageal reflux disease)    Glaucoma    right eye   Hearing loss    Bil/has hearing aids   HTN (hypertension)    Leg swelling    Migraines    Obesity    OSA on CPAP    Skin cancer of forehead     Patient Active Problem List   Diagnosis Date Noted   Pre-diabetes 05/21/2020   Class 1 obesity with serious comorbidity and body mass index (BMI) of 34.0 to 34.9 in adult 05/21/2020   Allergic rhinitis 04/18/2020   Allergic rhinitis due to animal (cat) (dog) hair and dander 04/18/2020   Allergic rhinitis due to pollen 04/18/2020   Chronic allergic conjunctivitis 04/18/2020   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   Localized, primary osteoarthritis 04/18/2020   Low back pain 04/18/2020   Mild intermittent asthma 04/18/2020   Osteoarthritis of knee 04/18/2020   Excessive cerumen in ear canal, bilateral 01/10/2018   Sensorineural hearing loss (SNHL), 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    Past Surgical History:  Procedure Laterality Date   CATARACT EXTRACTION     MOHS SURGERY     WISDOM TOOTH EXTRACTION         Family History  Problem Relation Age of Onset   Stroke Mother    Thyroid disease Mother    Obesity Mother    Hypertension Father    Heart disease Father    Sudden death Father    Depression Father    Anxiety disorder Father    Mental illness Sister     Social  History   Tobacco Use   Smoking status: Never   Smokeless tobacco: Never  Vaping Use   Vaping Use: Never used  Substance Use Topics   Alcohol use: No   Drug use: No    Home Medications Prior to Admission medications   Medication Sig Start Date End Date Taking? Authorizing Provider  Albuterol Sulfate (PROAIR HFA IN) Inhale into the lungs as needed.    [provider]  aspirin EC 81 MG tablet Take 1 tablet (81 mg total) by mouth daily. Swallow whole. 01/04/20   Geralynn Rile, MD  escitalopram (LEXAPRO) 10 MG tablet Take 10 mg by mouth daily.    [provider]  Galcanezumab-gnlm [facility]) Inject into the skin every 30 (thirty) days.    [provider]  losartan (COZAAR) 50 MG tablet Take 50 mg by mouth daily.    [provider]  metFORMIN (GLUCOPHAGE) 500 MG tablet TAKE 1 TABLET(500 MG) BY MOUTH TWICE DAILY WITH A MEAL 07/22/20   Beasley, Caren D, MD  polyethylene glycol (MIRALAX / GLYCOLAX) 17 g packet Take 17 g by mouth daily as needed.    [provider]  rosuvastatin (CRESTOR) 5 MG tablet Take 5 mg  by mouth daily. 12/20/19   [provider]  TRIAMTERENE PO Take 37.5 mg by mouth daily.    [provider]    Allergies    Penicillins  Review of Systems   Review of Systems  All other systems reviewed and are negative.  Physical Exam Updated Vital Signs BP (!) 168/80 (BP Location: Right Arm)   Pulse 69   Temp 98.3 F (36.8 C) (Oral)   Resp 18   Ht 1.753 m (5\' 9" )   Wt 102.1 kg   SpO2 98%   BMI 33.23 kg/m   Physical Exam Vitals and nursing note reviewed.  Constitutional:      General: He is not in acute distress.    Appearance: Normal appearance. He is well-developed. He is not toxic-appearing.  HENT:     Head: Normocephalic and atraumatic.     Mouth/Throat:     Comments: No abscess identified Eyes:     General: Lids are normal.     Conjunctiva/sclera: Conjunctivae normal.     Pupils:  Pupils are equal, round, and reactive to light.  Neck:     Thyroid: No thyroid mass.     Trachea: No tracheal deviation.  Cardiovascular:     Rate and Rhythm: Normal rate and regular rhythm.     Heart sounds: Normal heart sounds. No murmur heard.   No gallop.  Pulmonary:     Effort: Pulmonary effort is normal. No respiratory distress.     Breath sounds: Normal breath sounds. No stridor. No decreased breath sounds, wheezing, rhonchi or rales.  Abdominal:     General: There is no distension.     Palpations: Abdomen is soft.     Tenderness: There is no abdominal tenderness. There is no rebound.  Musculoskeletal:        General: No tenderness. Normal range of motion.     Cervical back: Normal range of motion and neck supple.  Skin:    General: Skin is warm and dry.     Findings: No abrasion or rash.  Neurological:     Mental Status: He is alert and oriented to person, place, and time. Mental status is at baseline.     GCS: GCS eye subscore is 4. GCS verbal subscore is 5. GCS motor subscore is 6.     Cranial Nerves: Cranial nerves are intact. No cranial nerve deficit.     Sensory: No sensory deficit.     Motor: Motor function is intact.  Psychiatric:        Attention and Perception: Attention normal.        Speech: Speech normal.        Behavior: Behavior normal.    ED Results / Procedures / Treatments   Labs (all labs ordered are listed, but only abnormal results are displayed) Labs Reviewed - No data to display  EKG None  Radiology No results found.  Procedures Procedures   Medications Ordered in ED Medications - No data to display  ED Course  I have reviewed the triage vital signs and the nursing notes.  Pertinent labs & imaging results that were available during my care of the patient were reviewed by me and considered in my medical decision making (see chart for details).    MDM Rules/Calculators/A&P                          Patient to see his dentist  tomorrow and will prescribe short course of  analgesics Final Clinical Impression(s) / ED Dia patient to see his dentist tomorrow and will prescribe short course of analgesicsgnoses Final diagnoses:  None    Rx / DC Orders ED Discharge Orders     None        Lacretia Leigh, MD 09/14/20 1943

## 2020-09-18 ENCOUNTER — Encounter (INDEPENDENT_AMBULATORY_CARE_PROVIDER_SITE_OTHER): Payer: Self-pay | Admitting: Family Medicine

## 2020-09-18 ENCOUNTER — Ambulatory Visit (INDEPENDENT_AMBULATORY_CARE_PROVIDER_SITE_OTHER): Payer: Medicare Other | Admitting: Family Medicine

## 2020-09-18 ENCOUNTER — Other Ambulatory Visit: Payer: Self-pay

## 2020-09-18 VITALS — BP 124/65 | HR 54 | Temp 97.6°F | Ht 69.0 in | Wt 226.0 lb

## 2020-09-18 DIAGNOSIS — Z6836 Body mass index (BMI) 36.0-36.9, adult: Secondary | ICD-10-CM

## 2020-09-18 DIAGNOSIS — E7849 Other hyperlipidemia: Secondary | ICD-10-CM

## 2020-09-18 DIAGNOSIS — E559 Vitamin D deficiency, unspecified: Secondary | ICD-10-CM | POA: Diagnosis not present

## 2020-09-18 DIAGNOSIS — R7303 Prediabetes: Secondary | ICD-10-CM

## 2020-09-18 MED ORDER — METFORMIN HCL 500 MG PO TABS: ORAL_TABLET | ORAL | 0 refills | Status: DC

## 2020-09-19 LAB — LIPID PANEL WITH LDL/HDL RATIO
Cholesterol, Total: 96 mg/dL — ABNORMAL LOW (ref 100–199)
HDL: 50 mg/dL (ref >39)
LDL Chol Calc (NIH): 34 mg/dL (ref 0–99)
LDL/HDL Ratio: 0.7 ratio (ref 0.0–3.6)
Triglycerides: 48 mg/dL (ref 0–149)
VLDL Cholesterol Cal: 12 mg/dL (ref 5–40)

## 2020-09-19 LAB — CMP14+EGFR
ALT: 18 IU/L (ref 0–44)
AST: 16 IU/L (ref 0–40)
Albumin/Globulin Ratio: 1.7 (ref 1.2–2.2)
Albumin: 4.5 g/dL (ref 3.7–4.7)
Alkaline Phosphatase: 55 IU/L (ref 44–121)
BUN/Creatinine Ratio: 14 (ref 10–24)
BUN: 14 mg/dL (ref 8–27)
Bilirubin Total: 0.4 mg/dL (ref 0.0–1.2)
CO2: 26 mmol/L (ref 20–29)
Calcium: 9.4 mg/dL (ref 8.6–10.2)
Chloride: 102 mmol/L (ref 96–106)
Creatinine, Ser: 1.01 mg/dL (ref 0.76–1.27)
Globulin, Total: 2.7 g/dL (ref 1.5–4.5)
Glucose: 92 mg/dL (ref 65–99)
Potassium: 4.7 mmol/L (ref 3.5–5.2)
Sodium: 142 mmol/L (ref 134–144)
Total Protein: 7.2 g/dL (ref 6.0–8.5)
eGFR: 79 mL/min/{1.73_m2} (ref >59)

## 2020-09-19 LAB — INSULIN, RANDOM: INSULIN: 17.4 u[IU]/mL (ref 2.6–24.9)

## 2020-09-19 LAB — HEMOGLOBIN A1C
Est. average glucose Bld gHb Est-mCnc: 111 mg/dL
Hgb A1c MFr Bld: 5.5 % (ref 4.8–5.6)

## 2020-09-19 LAB — VITAMIN D 25 HYDROXY (VIT D DEFICIENCY, FRACTURES): Vit D, 25-Hydroxy: 91.9 ng/mL (ref 30.0–100.0)

## 2020-09-29 NOTE — Progress Notes (Signed)
Chief Complaint:   OBESITY Stephen Oconnell is here to discuss his progress with his obesity treatment plan along with follow-up of his obesity related diagnoses. Stephen Oconnell is on the Category 3 Plan and states he is following his eating plan approximately 80-85% of the time. Stephen Oconnell states he is walking for 60 minutes 3 times per week.  Today's visit was #: 81 Starting weight: 248 lbs Starting date: 12/20/2019 Today's weight: 226 lbs Today's date: 09/18/2020 Total lbs lost to date: 22 Total lbs lost since last in-office visit: 2  Interim History: Stephen Oconnell continues to do well with weight loss. He will be doing some traveling soon and he has some good strategies to avoid celebration and travel weight gain.  Subjective:   1. Pre-diabetes Stephen Oconnell is stable on metformin. He is doing well with diet and exercise, and he is due for labs.  2. Vitamin D deficiency Stephen Oconnell is on Vit D, and he is due for labs. He denies nausea, vomiting, or muscle weakness.  3. Other hyperlipidemia Stephen Oconnell is on Crestor and he is doing well with diet and weight loss.  Assessment/Plan:   1. Pre-diabetes Stephen Oconnell will continue to work on weight loss, exercise, and decreasing simple carbohydrates to help decrease the risk of diabetes. We will check labs today, and we will refill metformin for 90 days with no refills.  - metFORMIN (GLUCOPHAGE) 500 MG tablet; TAKE 1 TABLET(500 MG) BY MOUTH TWICE DAILY WITH A MEAL  Dispense: 180 tablet; Refill: 0 - CMP14+EGFR - Hemoglobin A1c - Insulin, random  2. Vitamin D deficiency Low Vitamin D level contributes to fatigue and are associated with obesity, breast, and colon cancer. We will check labs today. Stephen Oconnell will follow-up for routine testing of Vitamin D, at least 2-3 times per year to avoid over-replacement.  - VITAMIN D 25 Hydroxy (Vit-D Deficiency, Fractures)  3. Other hyperlipidemia Cardiovascular risk and specific lipid/LDL goals reviewed. We discussed several lifestyle modifications today.  We will check labs today. Stephen Oconnell will continue to work on diet, exercise and weight loss efforts. Orders and follow up as documented in patient record.   - Lipid Panel With LDL/HDL Ratio  4. Obesity with current BMI 33.4 Stephen Oconnell is currently in the action stage of change. As such, his goal is to continue with weight loss efforts. He has agreed to the Category 3 Plan.   Exercise goals: As is.  Behavioral modification strategies: increasing lean protein intake, increasing water intake, travel eating strategies, and celebration eating strategies.  Stephen Oconnell has agreed to follow-up with our clinic in 4 weeks. He was informed of the importance of frequent follow-up visits to maximize his success with intensive lifestyle modifications for his multiple health conditions.   Stephen Oconnell was informed we would discuss his lab results at his next visit unless there is a critical issue that needs to be addressed sooner. Stephen Oconnell agreed to keep his next visit at the agreed upon time to discuss these results.  Objective:   Blood pressure 124/65, pulse (!) 54, temperature 97.6 F (36.4 C), height $RemoveBe'5\' 9"'[id number]$  (1.753 m), weight 226 lb (102.5 kg), SpO2 98 %. Body mass index is 33.37 kg/m.  General: Cooperative, alert, well developed, in no acute distress. HEENT: Conjunctivae and lids unremarkable. Cardiovascular: Regular rhythm.  Lungs: Normal work of breathing. Neurologic: No focal deficits.   Lab Results  Component Value Date   CREATININE 1.01 09/18/2020   BUN 14 09/18/2020   NA 142 09/18/2020   K 4.7 09/18/2020   CL  102 09/18/2020   CO2 26 09/18/2020   Lab Results  Component Value Date   ALT 18 09/18/2020   AST 16 09/18/2020   ALKPHOS 55 09/18/2020   BILITOT 0.4 09/18/2020   Lab Results  Component Value Date   HGBA1C 5.5 09/18/2020   HGBA1C 5.6 04/08/2020   HGBA1C 6.0 (H) 12/20/2019   Lab Results  Component Value Date   INSULIN 17.4 09/18/2020   INSULIN 17.2 04/08/2020   INSULIN 29.5 (H) 12/20/2019    Lab Results  Component Value Date   TSH 2.450 12/20/2019   Lab Results  Component Value Date   CHOL 96 (L) 09/18/2020   HDL 50 09/18/2020   LDLCALC 34 09/18/2020   TRIG 48 09/18/2020   Lab Results  Component Value Date   VD25OH 91.9 09/18/2020   VD25OH 72.8 04/08/2020   VD25OH 73.0 12/20/2019   Lab Results  Component Value Date   WBC 6.0 12/20/2019   HGB 16.6 12/20/2019   HCT 49.4 12/20/2019   MCV 96 12/20/2019   PLT 209 12/20/2019   No results found for: IRON, TIBC, FERRITIN  Obesity Behavioral Intervention:   Approximately 15 minutes were spent on the discussion below.  ASK: We discussed the diagnosis of obesity with Stephen Oconnell today and Stephen Oconnell agreed to give [name] permission to discuss obesity behavioral modification therapy today.  ASSESS: Stephen Oconnell has the diagnosis of obesity and his BMI today is 33.36. Stephen Oconnell is in the action stage of change.   ADVISE: Stephen Oconnell was educated on the multiple health risks of obesity as well as the benefit of weight loss to improve his health. He was advised of the need for long term treatment and the importance of lifestyle modifications to improve his current health and to decrease his risk of future health problems.  AGREE: Multiple dietary modification options and treatment options were discussed and Stephen Oconnell agreed to follow the recommendations documented in the above note.  ARRANGE: Stephen Oconnell was educated on the importance of frequent visits to treat obesity as outlined per CMS and USPSTF guidelines and agreed to schedule his next follow up appointment today.  Attestation Statements:   Reviewed by clinician on day of visit: allergies, medications, problem list, medical history, surgical history, family history, social history, and previous encounter notes.   I, Trixie Dredge, am acting as transcriptionist for [name], MD.  I have reviewed the above documentation for accuracy and completeness, and I agree with the above. -  [name],  MD

## 2020-10-20 ENCOUNTER — Other Ambulatory Visit: Payer: Self-pay

## 2020-10-20 ENCOUNTER — Ambulatory Visit (INDEPENDENT_AMBULATORY_CARE_PROVIDER_SITE_OTHER): Payer: Medicare Other | Admitting: Family Medicine

## 2020-10-20 ENCOUNTER — Encounter (INDEPENDENT_AMBULATORY_CARE_PROVIDER_SITE_OTHER): Payer: Self-pay | Admitting: Family Medicine

## 2020-10-20 VITALS — BP 128/64 | HR 66 | Temp 97.7°F | Ht 69.0 in | Wt 228.0 lb

## 2020-10-20 DIAGNOSIS — E782 Mixed hyperlipidemia: Secondary | ICD-10-CM

## 2020-10-20 DIAGNOSIS — Z6836 Body mass index (BMI) 36.0-36.9, adult: Secondary | ICD-10-CM

## 2020-10-20 DIAGNOSIS — R7303 Prediabetes: Secondary | ICD-10-CM | POA: Diagnosis not present

## 2020-10-20 MED ORDER — METFORMIN HCL 500 MG PO TABS: ORAL_TABLET | ORAL | 0 refills | Status: DC

## 2020-10-20 NOTE — Progress Notes (Signed)
Chief Complaint:   OBESITY Stephen Oconnell is here to discuss his progress with his obesity treatment plan along with follow-up of his obesity related diagnoses. Stephen Oconnell is on the Category 3 Plan and states he is following his eating plan approximately 80-85% of the time. Stephen Oconnell states he is walking for 60 minutes 3-4 times per week.  Today's visit was #: 18 Starting weight: 248 lbs Starting date: 12/20/2019 Today's weight: 228 lbs Today's date: 10/20/2020 Total lbs lost to date: 20 lbs Total lbs lost since last in-office visit: +2  Interim History: Stephen Oconnell recently traveled to [place] for a family gathering. However he feels he did fairly well on the plan. He has stopped weighing his meat. He is planning  a trip to [place] next week for business.  Subjective:   1. Pre-diabetes Anne's A1C is no 5.5 - down from 6.0 at start of program. He is on metformin BID.  Lab Results  Component Value Date   HGBA1C 5.5 09/18/2020   Lab Results  Component Value Date   INSULIN 17.4 09/18/2020   INSULIN 17.2 04/08/2020   INSULIN 29.5 (H) 12/20/2019    2. Mixed hyperlipidemia At goal. Stephen Oconnell's Triglycerides, LDL and HDL are all within normal limits. He is on Crestor '5mg'$ .  Lab Results  Component Value Date   CHOL 96 (L) 09/18/2020   HDL 50 09/18/2020   LDLCALC 34 09/18/2020   TRIG 48 09/18/2020   Lab Results  Component Value Date   ALT 18 09/18/2020   AST 16 09/18/2020   ALKPHOS 55 09/18/2020   BILITOT 0.4 09/18/2020   The ASCVD Risk score Stephen Bussing DC Jr., et al., 2013) failed to calculate for the following reasons:   The valid total cholesterol range is 130 to 320 mg/dL   Assessment/Plan:   1. Pre-diabetes Stephen Oconnell will continue Metformin 500 mg twice daily. We will refill Metformin for 90 days with no refills. He will continue to work on weight loss, exercise, and decreasing simple carbohydrates to help decrease the risk of diabetes.   - metFORMIN (GLUCOPHAGE) 500 MG tablet; TAKE 1 TABLET(500  MG) BY MOUTH TWICE DAILY WITH A MEAL  Dispense: 180 tablet; Refill: 0  2. Mixed hyperlipidemia Stephen Oconnell will continue Crestor 5 mg.   3. Obesity with current BMI 33.65 Stephen Oconnell is currently in the action stage of change. As such, his goal is to continue with weight loss efforts. He has agreed to the Category 3 Plan.   I discussed with Stephen Oconnell strategies for travel.  Exercise goals:  Stephen Oconnell will add resistance 2 times weekly. He was provided band.  Behavioral modification strategies: travel eating strategies.  Stephen Oconnell has agreed to follow-up with our clinic in 3 weeks.  Objective:   Blood pressure 128/64, pulse 66, temperature 97.7 F (36.5 C), height '5\' 9"'$  (1.753 m), weight 228 lb (103.4 kg), SpO2 97 %. Body mass index is 33.67 kg/m.  General: Cooperative, alert, well developed, in no acute distress. HEENT: Conjunctivae and lids unremarkable. Cardiovascular: Regular rhythm.  Lungs: Normal work of breathing. Neurologic: No focal deficits.   Lab Results  Component Value Date   CREATININE 1.01 09/18/2020   BUN 14 09/18/2020   NA 142 09/18/2020   K 4.7 09/18/2020   CL 102 09/18/2020   CO2 26 09/18/2020   Lab Results  Component Value Date   ALT 18 09/18/2020   AST 16 09/18/2020   ALKPHOS 55 09/18/2020   BILITOT 0.4 09/18/2020   Lab Results  Component Value Date  HGBA1C 5.5 09/18/2020   HGBA1C 5.6 04/08/2020   HGBA1C 6.0 (H) 12/20/2019   Lab Results  Component Value Date   INSULIN 17.4 09/18/2020   INSULIN 17.2 04/08/2020   INSULIN 29.5 (H) 12/20/2019   Lab Results  Component Value Date   TSH 2.450 12/20/2019   Lab Results  Component Value Date   CHOL 96 (L) 09/18/2020   HDL 50 09/18/2020   LDLCALC 34 09/18/2020   TRIG 48 09/18/2020   Lab Results  Component Value Date   VD25OH 91.9 09/18/2020   VD25OH 72.8 04/08/2020   VD25OH 73.0 12/20/2019   Lab Results  Component Value Date   WBC 6.0 12/20/2019   HGB 16.6 12/20/2019   HCT 49.4 12/20/2019   MCV 96 12/20/2019    PLT 209 12/20/2019   No results found for: IRON, TIBC, FERRITIN  Obesity Behavioral Intervention:   Approximately 15 minutes were spent on the discussion below.  ASK: We discussed the diagnosis of obesity with Stephen Oconnell today and Stephen Oconnell agreed to give [name] permission to discuss obesity behavioral modification therapy today.  ASSESS: Stephen Oconnell has the diagnosis of obesity and his BMI today is 33.7. Stephen Oconnell is in the action stage of change.   ADVISE: Stephen Oconnell was educated on the multiple health risks of obesity as well as the benefit of weight loss to improve his health. He was advised of the need for long term treatment and the importance of lifestyle modifications to improve his current health and to decrease his risk of future health problems.  AGREE: Multiple dietary modification options and treatment options were discussed and Stephen Oconnell agreed to follow the recommendations documented in the above note.  ARRANGE: Stephen Oconnell was educated on the importance of frequent visits to treat obesity as outlined per CMS and USPSTF guidelines and agreed to schedule his next follow up appointment today.  Attestation Statements:   Reviewed by clinician on day of visit: allergies, medications, problem list, medical history, surgical history, family history, social history, and previous encounter notes.  I, Lizbeth Bark, RMA, am acting as Location manager for [employer], [place].   I have reviewed the above documentation for accuracy and completeness, and I agree with the above. -  Georgianne Fick, FNP

## 2020-10-21 ENCOUNTER — Encounter (INDEPENDENT_AMBULATORY_CARE_PROVIDER_SITE_OTHER): Payer: Self-pay | Admitting: Family Medicine

## 2020-10-21 DIAGNOSIS — E782 Mixed hyperlipidemia: Secondary | ICD-10-CM | POA: Insufficient documentation

## 2020-11-11 ENCOUNTER — Ambulatory Visit (INDEPENDENT_AMBULATORY_CARE_PROVIDER_SITE_OTHER): Payer: Medicare Other | Admitting: Family Medicine

## 2020-11-11 ENCOUNTER — Encounter (INDEPENDENT_AMBULATORY_CARE_PROVIDER_SITE_OTHER): Payer: Self-pay | Admitting: Family Medicine

## 2020-11-11 ENCOUNTER — Other Ambulatory Visit: Payer: Self-pay

## 2020-11-11 VITALS — BP 128/67 | HR 60 | Temp 97.7°F | Ht 69.0 in | Wt 225.0 lb

## 2020-11-11 DIAGNOSIS — I10 Essential (primary) hypertension: Secondary | ICD-10-CM

## 2020-11-11 DIAGNOSIS — Z6836 Body mass index (BMI) 36.0-36.9, adult: Secondary | ICD-10-CM

## 2020-11-11 NOTE — Progress Notes (Signed)
Chief Complaint:   OBESITY Stephen Oconnell is here to discuss his progress with his obesity treatment plan along with follow-up of his obesity related diagnoses. Stephen Oconnell is on the Category 3 Plan and states he is following his eating plan approximately 80% of the time. Stephen Oconnell states he is walking for 60 minutes 3-4 times per week.  Today's visit was #: 72 Starting weight: 248 lbs Starting date: 12/20/2019 Today's weight: 225 lbs Today's date: 11/11/2020 Total lbs lost to date: 23 Total lbs lost since last in-office visit: 3  Interim History: Stephen Oconnell had gained a bit of weight last visit. He has gotten back on track and back to weight loss. His hunger is controlled and he is working on meal planning.  Subjective:   1. Essential hypertension Stephen Oconnell's blood pressure stable with diet, exercise, weight loss, and his medications. He denies chest pain or headache.  Assessment/Plan:   1. Essential hypertension Stephen Oconnell will continue as is and will continue to monitor his blood pressure. The goal is to decrease his medications needed with continued lifestyle changes.   2. Obesity with current BMI 33.3 Stephen Oconnell is currently in the action stage of change. As such, his goal is to continue with weight loss efforts. He has agreed to the Category 3 Plan.   Exercise goals: As is, add weights to walking.  Behavioral modification strategies: no skipping meals.  Stephen Oconnell has agreed to follow-up with our clinic in 4 weeks. He was informed of the importance of frequent follow-up visits to maximize his success with intensive lifestyle modifications for his multiple health conditions.   Objective:   Blood pressure 128/67, pulse 60, temperature 97.7 F (36.5 C), height '5\' 9"'$  (1.753 m), weight 225 lb (102.1 kg), SpO2 97 %. Body mass index is 33.23 kg/m.  General: Cooperative, alert, well developed, in no acute distress. HEENT: Conjunctivae and lids unremarkable. Cardiovascular: Regular rhythm.  Lungs: Normal work of  breathing. Neurologic: No focal deficits.   Lab Results  Component Value Date   CREATININE 1.01 09/18/2020   BUN 14 09/18/2020   NA 142 09/18/2020   K 4.7 09/18/2020   CL 102 09/18/2020   CO2 26 09/18/2020   Lab Results  Component Value Date   ALT 18 09/18/2020   AST 16 09/18/2020   ALKPHOS 55 09/18/2020   BILITOT 0.4 09/18/2020   Lab Results  Component Value Date   HGBA1C 5.5 09/18/2020   HGBA1C 5.6 04/08/2020   HGBA1C 6.0 (H) 12/20/2019   Lab Results  Component Value Date   INSULIN 17.4 09/18/2020   INSULIN 17.2 04/08/2020   INSULIN 29.5 (H) 12/20/2019   Lab Results  Component Value Date   TSH 2.450 12/20/2019   Lab Results  Component Value Date   CHOL 96 (L) 09/18/2020   HDL 50 09/18/2020   LDLCALC 34 09/18/2020   TRIG 48 09/18/2020   Lab Results  Component Value Date   VD25OH 91.9 09/18/2020   VD25OH 72.8 04/08/2020   VD25OH 73.0 12/20/2019   Lab Results  Component Value Date   WBC 6.0 12/20/2019   HGB 16.6 12/20/2019   HCT 49.4 12/20/2019   MCV 96 12/20/2019   PLT 209 12/20/2019   No results found for: IRON, TIBC, FERRITIN  Attestation Statements:   Reviewed by clinician on day of visit: allergies, medications, problem list, medical history, surgical history, family history, social history, and previous encounter notes.  Time spent on visit including pre-visit chart review and post-visit care and charting was  22 minutes.    I, Trixie Dredge, am acting as transcriptionist for [name], MD.  I have reviewed the above documentation for accuracy and completeness, and I agree with the above. -  [name], MD

## 2020-12-09 ENCOUNTER — Other Ambulatory Visit: Payer: Self-pay

## 2020-12-09 ENCOUNTER — Encounter (INDEPENDENT_AMBULATORY_CARE_PROVIDER_SITE_OTHER): Payer: Self-pay | Admitting: Family Medicine

## 2020-12-09 ENCOUNTER — Ambulatory Visit (INDEPENDENT_AMBULATORY_CARE_PROVIDER_SITE_OTHER): Payer: Medicare Other | Admitting: Family Medicine

## 2020-12-09 VITALS — BP 140/69 | HR 62 | Temp 98.0°F | Ht 69.0 in | Wt 225.0 lb

## 2020-12-09 DIAGNOSIS — Z6836 Body mass index (BMI) 36.0-36.9, adult: Secondary | ICD-10-CM | POA: Diagnosis not present

## 2020-12-09 DIAGNOSIS — F3289 Other specified depressive episodes: Secondary | ICD-10-CM | POA: Diagnosis not present

## 2020-12-09 NOTE — Progress Notes (Signed)
Chief Complaint:   OBESITY Stephen Oconnell is here to discuss his progress with his obesity treatment plan along with follow-up of his obesity related diagnoses. Stephen Oconnell is on the Category 3 Plan and states he is following his eating plan approximately 80% of the time. Stephen Oconnell states he is walking for 90 minutes 4 times per week.  Today's visit was #: 20 Starting weight: 248 lbs Starting date: 12/20/2019 Today's weight: 225 lbs Today's date: 12/09/2020 Total lbs lost to date: 23 Total lbs lost since last in-office visit: 0  Interim History: Stephen Oconnell has done well with weight loss. He is following his plan mostly, but he is struggling with increased afternoon snacking.  Subjective:   1. Other depression with emotional eating Stephen Oconnell notes increased borderline snacking in the afternoons. He isn't eating so much from hunger and he is eating his meal plan otherwise.  Assessment/Plan:   1. Other depression with emotional eating The etiology of emotional eating behaviors and why this happens was discussed in depth with Stephen Oconnell today. He will work on finding creative and productive activity to help decrease emotional eating behaviors. Orders and follow up as documented in patient record.   2. Obesity with current BMI 33.3 Stephen Oconnell is currently in the action stage of change. As such, his goal is to continue with weight loss efforts. He has agreed to the Category 3 Plan.   Exercise goals: As is.  Behavioral modification strategies: increasing lean protein intake and emotional eating strategies.  Stephen Oconnell has agreed to follow-up with our clinic in 3 to 4 weeks. He was informed of the importance of frequent follow-up visits to maximize his success with intensive lifestyle modifications for his multiple health conditions.   Objective:   Blood pressure 140/69, pulse 62, temperature 98 F (36.7 C), height 5\' 9"  (1.753 m), weight 225 lb (102.1 kg), SpO2 98 %. Body mass index is 33.23 kg/m.  General: Cooperative,  alert, well developed, in no acute distress. HEENT: Conjunctivae and lids unremarkable. Cardiovascular: Regular rhythm.  Lungs: Normal work of breathing. Neurologic: No focal deficits.   Lab Results  Component Value Date   CREATININE 1.01 09/18/2020   BUN 14 09/18/2020   NA 142 09/18/2020   K 4.7 09/18/2020   CL 102 09/18/2020   CO2 26 09/18/2020   Lab Results  Component Value Date   ALT 18 09/18/2020   AST 16 09/18/2020   ALKPHOS 55 09/18/2020   BILITOT 0.4 09/18/2020   Lab Results  Component Value Date   HGBA1C 5.5 09/18/2020   HGBA1C 5.6 04/08/2020   HGBA1C 6.0 (H) 12/20/2019   Lab Results  Component Value Date   INSULIN 17.4 09/18/2020   INSULIN 17.2 04/08/2020   INSULIN 29.5 (H) 12/20/2019   Lab Results  Component Value Date   TSH 2.450 12/20/2019   Lab Results  Component Value Date   CHOL 96 (L) 09/18/2020   HDL 50 09/18/2020   LDLCALC 34 09/18/2020   TRIG 48 09/18/2020   Lab Results  Component Value Date   VD25OH 91.9 09/18/2020   VD25OH 72.8 04/08/2020   VD25OH 73.0 12/20/2019   Lab Results  Component Value Date   WBC 6.0 12/20/2019   HGB 16.6 12/20/2019   HCT 49.4 12/20/2019   MCV 96 12/20/2019   PLT 209 12/20/2019   No results found for: IRON, TIBC, FERRITIN  Attestation Statements:   Reviewed by clinician on day of visit: allergies, medications, problem list, medical history, surgical history, family history, social  history, and previous encounter notes.  Time spent on visit including pre-visit chart review and post-visit care and charting was 32 minutes.    I, Trixie Dredge, am acting as transcriptionist for [name], MD.  I have reviewed the above documentation for accuracy and completeness, and I agree with the above. - [name], MD

## 2020-12-30 ENCOUNTER — Ambulatory Visit (INDEPENDENT_AMBULATORY_CARE_PROVIDER_SITE_OTHER): Payer: Medicare Other | Admitting: Family Medicine

## 2020-12-30 ENCOUNTER — Encounter (INDEPENDENT_AMBULATORY_CARE_PROVIDER_SITE_OTHER): Payer: Self-pay | Admitting: Family Medicine

## 2020-12-30 ENCOUNTER — Other Ambulatory Visit: Payer: Self-pay

## 2020-12-30 VITALS — BP 134/63 | HR 63 | Temp 98.1°F | Ht 69.0 in | Wt 226.0 lb

## 2020-12-30 DIAGNOSIS — I10 Essential (primary) hypertension: Secondary | ICD-10-CM

## 2020-12-30 DIAGNOSIS — Z6836 Body mass index (BMI) 36.0-36.9, adult: Secondary | ICD-10-CM | POA: Diagnosis not present

## 2020-12-30 NOTE — Progress Notes (Signed)
Chief Complaint:   OBESITY Stephen Oconnell is here to discuss his progress with his obesity treatment plan along with follow-up of his obesity related diagnoses. Stephen Oconnell is on the Category 3 Plan and states he is following his eating plan approximately 65% of the time. Stephen Oconnell states he is walking for 60 minutes 3 times per week.  Today's visit was #: 21 Starting weight: 248 lbs Starting date: 12/20/2019 Today's weight: 226 lbs Today's date: 12/30/2020 Total lbs lost to date: 22 Total lbs lost since last in-office visit: 0  Interim History: Stephen Oconnell has been deviating from his plan a bit more, mostly with increased snacking of carbohydrates. He feels he will do better with more frequent visit and he would like to go back to every 2 weeks. He is open to looking at other meal plan options.  Subjective:   1. Essential hypertension Stephen Oconnell's blood pressure is stable on his medications. He continue to work on diet and weight loss. No mention of chest pain, headache, or dizziness.  Assessment/Plan:   1. Essential hypertension Stephen Oconnell will continue with his diet, exercise, and medications, and he will watch for signs of hypotension with continued weight loss.  2. Obesity with current BMI 33.4 Stephen Oconnell is currently in the action stage of change. As such, his goal is to continue with weight loss efforts. He has agreed to the Category 3 Plan and keeping a food journal and adhering to recommended goals of 350-500 calories and 35+ grams of protein at lunch daily.   Exercise goals: As is.  Behavioral modification strategies: better snacking choices.  Stephen Oconnell has agreed to follow-up with our clinic in 2 weeks. He was informed of the importance of frequent follow-up visits to maximize his success with intensive lifestyle modifications for his multiple health conditions.   Objective:   Blood pressure 134/63, pulse 63, temperature 98.1 F (36.7 C), height 5\' 9"  (1.753 m), weight 226 lb (102.5 kg), SpO2 95 %. Body mass  index is 33.37 kg/m.  General: Cooperative, alert, well developed, in no acute distress. HEENT: Conjunctivae and lids unremarkable. Cardiovascular: Regular rhythm.  Lungs: Normal work of breathing. Neurologic: No focal deficits.   Lab Results  Component Value Date   CREATININE 1.01 09/18/2020   BUN 14 09/18/2020   NA 142 09/18/2020   K 4.7 09/18/2020   CL 102 09/18/2020   CO2 26 09/18/2020   Lab Results  Component Value Date   ALT 18 09/18/2020   AST 16 09/18/2020   ALKPHOS 55 09/18/2020   BILITOT 0.4 09/18/2020   Lab Results  Component Value Date   HGBA1C 5.5 09/18/2020   HGBA1C 5.6 04/08/2020   HGBA1C 6.0 (H) 12/20/2019   Lab Results  Component Value Date   INSULIN 17.4 09/18/2020   INSULIN 17.2 04/08/2020   INSULIN 29.5 (H) 12/20/2019   Lab Results  Component Value Date   TSH 2.450 12/20/2019   Lab Results  Component Value Date   CHOL 96 (L) 09/18/2020   HDL 50 09/18/2020   LDLCALC 34 09/18/2020   TRIG 48 09/18/2020   Lab Results  Component Value Date   VD25OH 91.9 09/18/2020   VD25OH 72.8 04/08/2020   VD25OH 73.0 12/20/2019   Lab Results  Component Value Date   WBC 6.0 12/20/2019   HGB 16.6 12/20/2019   HCT 49.4 12/20/2019   MCV 96 12/20/2019   PLT 209 12/20/2019   No results found for: IRON, TIBC, FERRITIN  Attestation Statements:   Reviewed by clinician  on day of visit: allergies, medications, problem list, medical history, surgical history, family history, social history, and previous encounter notes.  Time spent on visit including pre-visit chart review and post-visit care and charting was 30 minutes.    I, Trixie Dredge, am acting as transcriptionist for [name], MD.  I have reviewed the above documentation for accuracy and completeness, and I agree with the above. -  [name], MD

## 2021-01-13 ENCOUNTER — Encounter (INDEPENDENT_AMBULATORY_CARE_PROVIDER_SITE_OTHER): Payer: Self-pay | Admitting: Physician Assistant

## 2021-01-13 ENCOUNTER — Other Ambulatory Visit: Payer: Self-pay

## 2021-01-13 ENCOUNTER — Ambulatory Visit (INDEPENDENT_AMBULATORY_CARE_PROVIDER_SITE_OTHER): Payer: Medicare Other | Admitting: Physician Assistant

## 2021-01-13 VITALS — BP 123/63 | HR 64 | Temp 97.8°F | Ht 69.0 in | Wt 228.0 lb

## 2021-01-13 DIAGNOSIS — R7303 Prediabetes: Secondary | ICD-10-CM | POA: Diagnosis not present

## 2021-01-13 DIAGNOSIS — Z6836 Body mass index (BMI) 36.0-36.9, adult: Secondary | ICD-10-CM | POA: Diagnosis not present

## 2021-01-13 NOTE — Progress Notes (Signed)
Chief Complaint:   OBESITY Stephen Oconnell is here to discuss his progress with his obesity treatment plan along with follow-up of his obesity related diagnoses. Stephen Oconnell is on the Category 3 Plan and keeping a food journal and adhering to recommended goals of 350-500 calories and 35+ grams of protein at lunch daily and states he is following his eating plan approximately 75% of the time. Stephen Oconnell states he is walking for 60 minutes 3-4 times per week.  Today's visit was #: 22 Starting weight: 248 lbs Starting date: 12/20/2019 Today's weight: 228 lbs Today's date: 01/13/2021 Total lbs lost to date: 20 Total lbs lost since last in-office visit: 0  Interim History: Stephen Oconnell states that he is discouraged with his weight today, as he feels he has plateaued. He is not weighing his protein at lunch or dinner. He is not counting his snack calories. He is drinking 32 oz of water daily.   Subjective:   1. Pre-diabetes Stephen Oconnell is on metformin BID, and his last A1c was 5.5. We briefly discussed alternative medications for appetite and craving control, but he is not interested.  Assessment/Plan:   1. Pre-diabetes Stephen Oconnell will continue with his medications, weight loss, and decreasing simple carbohydrates to help decrease the risk of diabetes.   2. Obesity with current BMI 33.65 Stephen Oconnell is currently in the action stage of change. As such, his goal is to continue with weight loss efforts. He has agreed to the Category 3 Plan.   Exercise goals: As is.  Behavioral modification strategies: increasing water intake, meal planning and cooking strategies, and planning for success.  Stephen Oconnell has agreed to follow-up with our clinic in 2 weeks. He was informed of the importance of frequent follow-up visits to maximize his success with intensive lifestyle modifications for his multiple health conditions.   Objective:   Blood pressure 123/63, pulse 64, temperature 97.8 F (36.6 C), height 5\' 9"  (1.753 m), weight 228 lb (103.4 kg),  SpO2 96 %. Body mass index is 33.67 kg/m.  General: Cooperative, alert, well developed, in no acute distress. HEENT: Conjunctivae and lids unremarkable. Cardiovascular: Regular rhythm.  Lungs: Normal work of breathing. Neurologic: No focal deficits.   Lab Results  Component Value Date   CREATININE 1.01 09/18/2020   BUN 14 09/18/2020   NA 142 09/18/2020   K 4.7 09/18/2020   CL 102 09/18/2020   CO2 26 09/18/2020   Lab Results  Component Value Date   ALT 18 09/18/2020   AST 16 09/18/2020   ALKPHOS 55 09/18/2020   BILITOT 0.4 09/18/2020   Lab Results  Component Value Date   HGBA1C 5.5 09/18/2020   HGBA1C 5.6 04/08/2020   HGBA1C 6.0 (H) 12/20/2019   Lab Results  Component Value Date   INSULIN 17.4 09/18/2020   INSULIN 17.2 04/08/2020   INSULIN 29.5 (H) 12/20/2019   Lab Results  Component Value Date   TSH 2.450 12/20/2019   Lab Results  Component Value Date   CHOL 96 (L) 09/18/2020   HDL 50 09/18/2020   LDLCALC 34 09/18/2020   TRIG 48 09/18/2020   Lab Results  Component Value Date   VD25OH 91.9 09/18/2020   VD25OH 72.8 04/08/2020   VD25OH 73.0 12/20/2019   Lab Results  Component Value Date   WBC 6.0 12/20/2019   HGB 16.6 12/20/2019   HCT 49.4 12/20/2019   MCV 96 12/20/2019   PLT 209 12/20/2019   No results found for: IRON, TIBC, FERRITIN  Obesity Behavioral Intervention:  Approximately 15 minutes were spent on the discussion below.  ASK: We discussed the diagnosis of obesity with Stephen Oconnell today and Stephen Oconnell agreed to give [name] permission to discuss obesity behavioral modification therapy today.  ASSESS: Stephen Oconnell has the diagnosis of obesity and his BMI today is 33.7. Stephen Oconnell is in the action stage of change.   ADVISE: Stephen Oconnell was educated on the multiple health risks of obesity as well as the benefit of weight loss to improve his health. He was advised of the need for long term treatment and the importance of lifestyle modifications to improve his current health and  to decrease his risk of future health problems.  AGREE: Multiple dietary modification options and treatment options were discussed and Stephen Oconnell agreed to follow the recommendations documented in the above note.  ARRANGE: Stephen Oconnell was educated on the importance of frequent visits to treat obesity as outlined per CMS and USPSTF guidelines and agreed to schedule his next follow up appointment today.  Attestation Statements:   Reviewed by clinician on day of visit: allergies, medications, problem list, medical history, surgical history, family history, social history, and previous encounter notes.   Wilhemena Durie, am acting as transcriptionist for [employer], PA-C.  I have reviewed the above documentation for accuracy and completeness, and I agree with the above. Abby Potash, PA-C

## 2021-02-05 ENCOUNTER — Ambulatory Visit (INDEPENDENT_AMBULATORY_CARE_PROVIDER_SITE_OTHER): Payer: Medicare Other | Admitting: Physician Assistant

## 2021-02-10 ENCOUNTER — Ambulatory Visit (INDEPENDENT_AMBULATORY_CARE_PROVIDER_SITE_OTHER): Payer: Medicare Other | Admitting: Family Medicine

## 2021-02-10 ENCOUNTER — Other Ambulatory Visit: Payer: Self-pay

## 2021-02-10 ENCOUNTER — Encounter (INDEPENDENT_AMBULATORY_CARE_PROVIDER_SITE_OTHER): Payer: Self-pay | Admitting: Family Medicine

## 2021-02-10 VITALS — BP 132/65 | HR 65 | Temp 97.9°F | Ht 69.0 in | Wt 222.0 lb

## 2021-02-10 DIAGNOSIS — R7303 Prediabetes: Secondary | ICD-10-CM

## 2021-02-10 DIAGNOSIS — Z6836 Body mass index (BMI) 36.0-36.9, adult: Secondary | ICD-10-CM | POA: Diagnosis not present

## 2021-02-10 NOTE — Progress Notes (Signed)
Chief Complaint:   OBESITY Stephen Oconnell is here to discuss his progress with his obesity treatment plan along with follow-up of his obesity related diagnoses. [name] is on the Category 3 Plan and states he is following his eating plan approximately 75-80% of the time. [name] states he is walking for 60 minutes 3-4 times per week.  Today's visit was #: 23 Starting weight: 248 lbs Starting date: 12/20/2019 Today's weight: 222 lbs Today's date: 02/10/2021 Total lbs lost to date: 26 Total lbs lost since last in-office visit: 6  Interim History: Stephen Oconnell continues to do well with weight loss even over Thanksgiving. He is getting good support from his wife. He is walking regularly.  Subjective:   1. Pre-diabetes Stephen Oconnell is doing well on metformin, and he denies nausea, vomiting, or hypoglycemia. He is decreasing simple carbohydrates and exercising regularly.  Assessment/Plan:   1. Pre-diabetes Stephen Oconnell will continue diet, exercise, and metformin as is, and we will recheck labs in 1 month.   2. Obesity BMI today is 53 Stephen Oconnell is currently in the action stage of change. As such, his goal is to continue with weight loss efforts. He has agreed to the Category 3 Plan.   We will recheck fasting labs at [facility] next visit.  Exercise goals: As is.  Behavioral modification strategies: increasing lean protein intake and meal planning and cooking strategies.  Stephen Oconnell has agreed to follow-up with our clinic in 4 to 5 weeks. He was informed of the importance of frequent follow-up visits to maximize his success with intensive lifestyle modifications for his multiple health conditions.   Objective:   Blood pressure 132/65, pulse 65, temperature 97.9 F (36.6 C), height 5\' 9"  (1.753 m), weight 222 lb (100.7 kg), SpO2 96 %. Body mass index is 32.78 kg/m.  General: Cooperative, alert, well developed, in no acute distress. HEENT: Conjunctivae and lids unremarkable. Cardiovascular: Regular rhythm.  Lungs: Normal work  of breathing. Neurologic: No focal deficits.   Lab Results  Component Value Date   CREATININE 1.01 09/18/2020   BUN 14 09/18/2020   NA 142 09/18/2020   K 4.7 09/18/2020   CL 102 09/18/2020   CO2 26 09/18/2020   Lab Results  Component Value Date   ALT 18 09/18/2020   AST 16 09/18/2020   ALKPHOS 55 09/18/2020   BILITOT 0.4 09/18/2020   Lab Results  Component Value Date   HGBA1C 5.5 09/18/2020   HGBA1C 5.6 04/08/2020   HGBA1C 6.0 (H) 12/20/2019   Lab Results  Component Value Date   INSULIN 17.4 09/18/2020   INSULIN 17.2 04/08/2020   INSULIN 29.5 (H) 12/20/2019   Lab Results  Component Value Date   TSH 2.450 12/20/2019   Lab Results  Component Value Date   CHOL 96 (L) 09/18/2020   HDL 50 09/18/2020   LDLCALC 34 09/18/2020   TRIG 48 09/18/2020   Lab Results  Component Value Date   VD25OH 91.9 09/18/2020   VD25OH 72.8 04/08/2020   VD25OH 73.0 12/20/2019   Lab Results  Component Value Date   WBC 6.0 12/20/2019   HGB 16.6 12/20/2019   HCT 49.4 12/20/2019   MCV 96 12/20/2019   PLT 209 12/20/2019   No results found for: IRON, TIBC, FERRITIN  Attestation Statements:   Reviewed by clinician on day of visit: allergies, medications, problem list, medical history, surgical history, family history, social history, and previous encounter notes.  Time spent on visit including pre-visit chart review and post-visit care and charting was  32 minutes.    I, Trixie Dredge, am acting as transcriptionist for [name], MD.  I have reviewed the above documentation for accuracy and completeness, and I agree with the above. -  [name], MD

## 2021-02-11 NOTE — Progress Notes (Signed)
Cardiology Office Note:   Date:  02/12/2021  NAME:  Stephen Oconnell    MRN: [id number] DOB:  Dec 21, 1945   PCP:  Velna Hatchet, MD  Cardiologist:  None  Electrophysiologist:  None   Referring MD: Velna Hatchet, MD   Chief Complaint  Patient presents with   Follow-up        History of Present Illness:   Stephen Oconnell is a 75 y.o. male with a hx of elevated coronary calcium, HLD who presents for follow-up.  He presents for follow-up.  He is doing well.  Most recent LDL cholesterol is 34 which is at goal.  He is on Crestor 5 mg daily without issues.  He reports he has had lightheaded episodes while in church.  He reports he becomes slightly unresponsive but does not blackout.  He does not pass out.  He reports they strictly occur during church services.  He is not skipping meals or missing oral intake prior to these episodes.  He reports he likely is not drinking enough water.  He may drink 4 to 5 glasses/day.  I have encouraged him to drink 6 to 8/day.  He is on metformin 500 mg twice a day.  His A1c is 5.5.  I do wonder if there is hypoglycemic events going on here.  We discussed stopping metformin to see if this helps.  He has not had any overt syncope.  He describes no lightheadedness or dizziness.  He actually is exercising quite a bit.  He can walk 3 to 4 days/week.  He can walk up to 1 hour.  He can walk up to 2 miles with no significant chest pain or trouble breathing.  The symptoms of lightheadedness appear to occur simply with church.  He does seem to like his pastor.  There is nothing about the sermons that are troubling or upsetting.  Otherwise seems to be stable without symptoms. BP is not checked during these episodes but I have asked him to see if he can check during these episodes.   Problem List 1. CAD -coronary calcium score 138 (47th percentile) -T chol 96, HDL 50, LDL 34, TG 48 2. HTN 3. Obesity 4. OSA 5. RBBB  Past Medical History: Past Medical History:  Diagnosis Date    Allergies    Asthma    on inhaler   Constipation    COPD (chronic obstructive pulmonary disease) (HCC)    Emphysema lung (HCC)    GERD (gastroesophageal reflux disease)    Glaucoma    right eye   Hearing loss    Bil/has hearing aids   HTN (hypertension)    Leg swelling    Migraines    Obesity    OSA on CPAP    Skin cancer of forehead     Past Surgical History: Past Surgical History:  Procedure Laterality Date   CATARACT EXTRACTION     MOHS SURGERY     WISDOM TOOTH EXTRACTION      Current Medications: Current Meds  Medication Sig   Albuterol Sulfate (PROAIR HFA IN) Inhale into the lungs as needed.   aspirin EC 81 MG tablet Take 1 tablet (81 mg total) by mouth daily. Swallow whole.   escitalopram (LEXAPRO) 10 MG tablet Take 10 mg by mouth daily.   Galcanezumab-gnlm (EMGALITY [place]) Inject into the skin every 30 (thirty) days.   losartan (COZAAR) 50 MG tablet Take 50 mg by mouth daily.   polyethylene glycol (MIRALAX / GLYCOLAX) 17 g packet Take 17 g  by mouth daily as needed.   rosuvastatin (CRESTOR) 5 MG tablet Take 5 mg by mouth daily.   TRIAMTERENE PO Take 37.5 mg by mouth daily.   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  Tobacco Use   Smoking status: Never   Smokeless tobacco: Never  Vaping Use   Vaping Use: Never used  Substance and Sexual Activity   Alcohol use: No   Drug use: No   Sexual activity: Not on file  Other Topics Concern   Not on file  Social History Narrative   Not on file   Social Determinants of Health   Financial Resource Strain: Not on file  Food Insecurity: Not on file   Transportation Needs: Not on file  Physical Activity: Not on file  Stress: Not on file  Social Connections: Not on file     Family History: The patient's family history includes Anxiety disorder in his father; Depression in his father; Heart disease in his father; Hypertension in his father; Mental illness in his sister; Obesity in his mother; Stroke in his mother; Sudden death in his father; Thyroid disease in his mother.  ROS:   All other ROS reviewed and negative. Pertinent positives noted in the HPI.     EKGs/Labs/Other Studies Reviewed:   The following studies were personally reviewed by me today:  Recent Labs: 09/18/2020: ALT 18; BUN 14; Creatinine, Ser 1.01; Potassium 4.7; Sodium 142   Recent Lipid Panel    Component Value Date/Time   CHOL 96 (L) 09/18/2020 1107   TRIG 48 09/18/2020 1107   HDL 50 09/18/2020 1107   LDLCALC 34 09/18/2020 1107    Physical Exam:   VS:  BP 136/60   Pulse 72   Ht 5\' 9"  (1.753 m)   Wt 232 lb 6.4 oz (105.4 kg)   SpO2 96%   BMI 34.32 kg/m    Wt Readings from Last 3 Encounters:  02/12/21 232 lb 6.4 oz (105.4 kg)  02/10/21 222 lb (100.7 kg)  01/13/21 228 lb (103.4 kg)    General: Well nourished, well developed, in no acute distress Head: Atraumatic, normal size  Eyes: PEERLA, EOMI  Neck: Supple, no JVD Endocrine: No thryomegaly Cardiac: Normal S1, S2; RRR; no murmurs, rubs, or gallops Lungs: Clear to auscultation bilaterally, no wheezing, rhonchi or rales  Abd: Soft, nontender, no hepatomegaly  Ext: No edema, pulses 2+ Musculoskeletal: No deformities, BUE and BLE strength normal and equal Skin: Warm and dry, no rashes   Neuro: Alert and oriented to person, place, time, and situation, CNII-XII grossly intact, no focal deficits  Psych: Normal mood and affect   ASSESSMENT:   Stephen Oconnell is a 75 y.o. male who presents for the following: 1. Agatston coronary artery calcium score between 100 and 199   2. Mixed hyperlipidemia   3.  Light headed     PLAN:   1. Agatston coronary artery calcium score between 100 and 199 2. Mixed hyperlipidemia - Coronary calcium score 138 which is the 47th percentile.  He was placed on Crestor 5 mg daily.  His most recent  LDL cholesterol is 34.  Would recommend he continue this for now.  He is also on aspirin 81 mg daily.  No major bleeding issues.  3. Light headed -He describes lightheaded spells that occur strictly during church services.  He can walk up to 2 miles without chest pain or trouble breathing.  He has not had overt syncope.  I do wonder if metformin is possibly contributing.  He appears to like most aspects of his church.  I do not see a need for a monitor as his exam is normal today.  He can complete a high level of activity.  I also encouraged him to increase his hydration level.  I think he is not drinking enough water.  Possibly the combination of dehydration and metformin is causing the spells.  We will see how things go.  He will continue to see [name] yearly  Disposition: Return in about 1 year (around 02/12/2022).  Medication Adjustments/Labs and Tests Ordered: Current medicines are reviewed at length with the patient today.  Concerns regarding medicines are outlined above.  No orders of the defined types were placed in this encounter.  No orders of the defined types were placed in this encounter.   Patient Instructions  Medication Instructions:  STOP Metformin   *If you need a refill on your cardiac medications before your next appointment, please call your pharmacy*   Follow-Up: At [facility], you and your health needs are our priority.  As part of our continuing mission to provide you with exceptional heart care, we have created designated Provider Care Teams.  These Care Teams include your primary Cardiologist (physician) and Advanced Practice Providers (APPs -  Physician Assistants and Nurse Practitioners) who all work together to provide you with the care you  need, when you need it.  We recommend signing up for the patient portal called "MyChart".  Sign up information is provided on this After Visit Summary.  MyChart is used to connect with patients for Virtual Visits (Telemedicine).  Patients are able to view lab/test results, encounter notes, upcoming appointments, etc.  Non-urgent messages can be sent to your provider as well.   To learn more about what you can do with MyChart, go to [email or web address].    Your next appointment:   12 month(s)  The format for your next appointment:   In Person  Provider:  Sande Rives, PA-C or Almyra Deforest, PA-C  }     Time Spent with Patient: I have spent a total of 25 minutes with patient reviewing hospital notes, telemetry, EKGs, labs and examining the patient as well as establishing an assessment and plan that was discussed with the patient.  > 50% of time was spent in direct patient care.  Signed, Addison Naegeli. Audie Box, MD, [place]  [address] [place], [place] [address] [phone]  02/12/2021 2:47 PM

## 2021-02-12 ENCOUNTER — Ambulatory Visit: Payer: Medicare Other | Admitting: Cardiovascular Disease

## 2021-02-12 ENCOUNTER — Encounter: Payer: Self-pay | Admitting: Cardiovascular Disease

## 2021-02-12 ENCOUNTER — Other Ambulatory Visit: Payer: Self-pay

## 2021-02-12 VITALS — BP 136/60 | HR 72 | Ht 69.0 in | Wt 232.4 lb

## 2021-02-12 DIAGNOSIS — R42 Dizziness and giddiness: Secondary | ICD-10-CM | POA: Diagnosis not present

## 2021-02-12 DIAGNOSIS — E782 Mixed hyperlipidemia: Secondary | ICD-10-CM | POA: Diagnosis not present

## 2021-02-12 DIAGNOSIS — R931 Abnormal findings on diagnostic imaging of heart and coronary circulation: Secondary | ICD-10-CM | POA: Diagnosis not present

## 2021-02-12 NOTE — Patient Instructions (Signed)
Medication Instructions:  STOP Metformin   *If you need a refill on your cardiac medications before your next appointment, please call your pharmacy*   Follow-Up: At [facility], you and your health needs are our priority.  As part of our continuing mission to provide you with exceptional heart care, we have created designated Provider Care Teams.  These Care Teams include your primary Cardiologist (physician) and Advanced Practice Providers (APPs -  Physician Assistants and Nurse Practitioners) who all work together to provide you with the care you need, when you need it.  We recommend signing up for the patient portal called "MyChart".  Sign up information is provided on this After Visit Summary.  MyChart is used to connect with patients for Virtual Visits (Telemedicine).  Patients are able to view lab/test results, encounter notes, upcoming appointments, etc.  Non-urgent messages can be sent to your provider as well.   To learn more about what you can do with MyChart, go to [email or web address].    Your next appointment:   12 month(s)  The format for your next appointment:   In Person  Provider:  Sande Rives, PA-C or Almyra Deforest, PA-C  }

## 2021-03-19 ENCOUNTER — Encounter (INDEPENDENT_AMBULATORY_CARE_PROVIDER_SITE_OTHER): Payer: Self-pay | Admitting: Family Medicine

## 2021-03-19 ENCOUNTER — Ambulatory Visit (INDEPENDENT_AMBULATORY_CARE_PROVIDER_SITE_OTHER): Payer: Medicare Other | Admitting: Family Medicine

## 2021-03-19 ENCOUNTER — Other Ambulatory Visit: Payer: Self-pay

## 2021-03-19 VITALS — BP 132/64 | HR 62 | Temp 97.7°F | Ht 69.0 in | Wt 227.0 lb

## 2021-03-19 DIAGNOSIS — E782 Mixed hyperlipidemia: Secondary | ICD-10-CM | POA: Diagnosis not present

## 2021-03-19 DIAGNOSIS — R7303 Prediabetes: Secondary | ICD-10-CM | POA: Diagnosis not present

## 2021-03-19 DIAGNOSIS — E538 Deficiency of other specified B group vitamins: Secondary | ICD-10-CM

## 2021-03-19 DIAGNOSIS — E559 Vitamin D deficiency, unspecified: Secondary | ICD-10-CM | POA: Diagnosis not present

## 2021-03-19 DIAGNOSIS — Z6833 Body mass index (BMI) 33.0-33.9, adult: Secondary | ICD-10-CM

## 2021-03-20 LAB — LIPID PANEL WITH LDL/HDL RATIO
Cholesterol, Total: 90 mg/dL — ABNORMAL LOW (ref 100–199)
HDL: 48 mg/dL (ref >39)
LDL Chol Calc (NIH): 28 mg/dL (ref 0–99)
LDL/HDL Ratio: 0.6 ratio (ref 0.0–3.6)
Triglycerides: 61 mg/dL (ref 0–149)
VLDL Cholesterol Cal: 14 mg/dL (ref 5–40)

## 2021-03-20 LAB — CMP14+EGFR
ALT: 20 IU/L (ref 0–44)
AST: 17 IU/L (ref 0–40)
Albumin/Globulin Ratio: 1.7 (ref 1.2–2.2)
Albumin: 4.7 g/dL (ref 3.7–4.7)
Alkaline Phosphatase: 56 IU/L (ref 44–121)
BUN/Creatinine Ratio: 21 (ref 10–24)
BUN: 24 mg/dL (ref 8–27)
Bilirubin Total: 0.6 mg/dL (ref 0.0–1.2)
CO2: 28 mmol/L (ref 20–29)
Calcium: 9.5 mg/dL (ref 8.6–10.2)
Chloride: 98 mmol/L (ref 96–106)
Creatinine, Ser: 1.16 mg/dL (ref 0.76–1.27)
Globulin, Total: 2.7 g/dL (ref 1.5–4.5)
Glucose: 93 mg/dL (ref 70–99)
Potassium: 4.8 mmol/L (ref 3.5–5.2)
Sodium: 140 mmol/L (ref 134–144)
Total Protein: 7.4 g/dL (ref 6.0–8.5)
eGFR: 67 mL/min/{1.73_m2} (ref >59)

## 2021-03-20 LAB — HEMOGLOBIN A1C
Est. average glucose Bld gHb Est-mCnc: 111 mg/dL
Hgb A1c MFr Bld: 5.5 % (ref 4.8–5.6)

## 2021-03-20 LAB — VITAMIN B12: Vitamin B-12: 993 pg/mL (ref 232–1245)

## 2021-03-20 LAB — INSULIN, RANDOM: INSULIN: 15.1 u[IU]/mL (ref 2.6–24.9)

## 2021-03-20 LAB — VITAMIN D 25 HYDROXY (VIT D DEFICIENCY, FRACTURES): Vit D, 25-Hydroxy: 51.2 ng/mL (ref 30.0–100.0)

## 2021-03-23 NOTE — Progress Notes (Signed)
Chief Complaint:   OBESITY Stephen Oconnell is here to discuss his progress with his obesity treatment plan along with follow-up of his obesity related diagnoses. Stephen Oconnell is on the Category 3 Plan and states he is following his eating plan approximately 60% of the time. Stephen Oconnell states he is walking for 60 minutes 3 times per week.  Today's visit was #: 24 Starting weight: 248 lbs Starting date: 12/20/2019 Today's weight: 227 lbs Today's date: 03/19/2021 Total lbs lost to date: 21 Total lbs lost since last in-office visit: 0  Interim History: Stephen Oconnell did some celebration eating over the holidays. He is ready to get back on track and he is open to looking at other eating options.  Subjective:   1. Pre-diabetes Stephen Oconnell discontinued metformin due to feeling lightheaded, specifically while in church.  2. Mixed hyperlipidemia Stephen Oconnell is on Crestor, and he is working on his diet.  3. Vitamin D deficiency Stephen Oconnell's last Vit D level was elevated. He is due to have labs checked.  4. B12 deficiency Stephen Oconnell is on B12 supplementation, and he has no recent labs.  Assessment/Plan:   1. Pre-diabetes We will check labs today. Stephen Oconnell may restart metformin based on his labs results. He will continue to work on diet, exercise, and decreasing simple carbohydrates to help decrease the risk of diabetes.   - CMP14+EGFR - Insulin, random - Hemoglobin A1c  2. Mixed hyperlipidemia Cardiovascular risk and specific lipid/LDL goals reviewed. We discussed several lifestyle modifications today. We will check labs today. Stephen Oconnell will continue Crestor, and will continue to work on diet, exercise and weight loss efforts. Orders and follow up as documented in patient record.   - Lipid Panel With LDL/HDL Ratio  3. Vitamin D deficiency Low Vitamin D level contributes to fatigue and are associated with obesity, breast, and colon cancer. We will check labs today. Stephen Oconnell will follow-up for routine testing of Vitamin D, at least 2-3 times per  year to avoid over-replacement.  - VITAMIN D 25 Hydroxy (Vit-D Deficiency, Fractures)  4. B12 deficiency The diagnosis was reviewed with the patient. We will check labs today, and will follow up at Stephen Oconnell next visit. Orders and follow up as documented in patient record.  - Vitamin B12  5. Obesity BMI today is 33.6 Stephen Oconnell is currently in the action stage of change. As such, his goal is to continue with weight loss efforts. He has agreed to change to following a lower carbohydrate, vegetable and lean protein rich diet plan.   Exercise goals: As is.  Behavioral modification strategies: increasing lean protein intake and meal planning and cooking strategies.  Stephen Oconnell has agreed to follow-up with our clinic in 4 weeks. He was informed of the importance of frequent follow-up visits to maximize his success with intensive lifestyle modifications for his multiple health conditions.   Objective:   Blood pressure 132/64, pulse 62, temperature 97.7 F (36.5 C), height $RemoveBe'5\' 9"'OLkswDxeP$  (1.753 m), weight 227 lb (103 kg), SpO2 97 %. Body mass index is 33.52 kg/m.  General: Cooperative, alert, well developed, in no acute distress. HEENT: Conjunctivae and lids unremarkable. Cardiovascular: Regular rhythm.  Lungs: Normal work of breathing. Neurologic: No focal deficits.   Lab Results  Component Value Date   CREATININE 1.16 03/19/2021   BUN 24 03/19/2021   NA 140 03/19/2021   K 4.8 03/19/2021   CL 98 03/19/2021   CO2 28 03/19/2021   Lab Results  Component Value Date   ALT 20 03/19/2021   AST 17  03/19/2021   ALKPHOS 56 03/19/2021   BILITOT 0.6 03/19/2021   Lab Results  Component Value Date   HGBA1C 5.5 03/19/2021   HGBA1C 5.5 09/18/2020   HGBA1C 5.6 04/08/2020   HGBA1C 6.0 (H) 12/20/2019   Lab Results  Component Value Date   INSULIN 15.1 03/19/2021   INSULIN 17.4 09/18/2020   INSULIN 17.2 04/08/2020   INSULIN 29.5 (H) 12/20/2019   Lab Results  Component Value Date   TSH 2.450 12/20/2019    Lab Results  Component Value Date   CHOL 90 (L) 03/19/2021   HDL 48 03/19/2021   LDLCALC 28 03/19/2021   TRIG 61 03/19/2021   Lab Results  Component Value Date   VD25OH 51.2 03/19/2021   VD25OH 91.9 09/18/2020   VD25OH 72.8 04/08/2020   Lab Results  Component Value Date   WBC 6.0 12/20/2019   HGB 16.6 12/20/2019   HCT 49.4 12/20/2019   MCV 96 12/20/2019   PLT 209 12/20/2019   No results found for: IRON, TIBC, FERRITIN  Obesity Behavioral Intervention:   Approximately 15 minutes were spent on the discussion below.  ASK: We discussed the diagnosis of obesity with Stephen Oconnell today and Stephen Oconnell agreed to give [name] permission to discuss obesity behavioral modification therapy today.  ASSESS: Stephen Oconnell has the diagnosis of obesity and his BMI today is 33.6. Stephen Oconnell is in the action stage of change.   ADVISE: Stephen Oconnell was educated on the multiple health risks of obesity as well as the benefit of weight loss to improve his health. He was advised of the need for long term treatment and the importance of lifestyle modifications to improve his current health and to decrease his risk of future health problems.  AGREE: Multiple dietary modification options and treatment options were discussed and Stephen Oconnell agreed to follow the recommendations documented in the above note.  ARRANGE: Stephen Oconnell was educated on the importance of frequent visits to treat obesity as outlined per CMS and USPSTF guidelines and agreed to schedule his next follow up appointment today.  Attestation Statements:   Reviewed by clinician on day of visit: allergies, medications, problem list, medical history, surgical history, family history, social history, and previous encounter notes.   I, Trixie Dredge, am acting as transcriptionist for [name], MD.  I have reviewed the above documentation for accuracy and completeness, and I agree with the above. -  [name], MD

## 2021-04-15 ENCOUNTER — Encounter (INDEPENDENT_AMBULATORY_CARE_PROVIDER_SITE_OTHER): Payer: Self-pay | Admitting: Family Medicine

## 2021-04-15 ENCOUNTER — Other Ambulatory Visit: Payer: Self-pay

## 2021-04-15 ENCOUNTER — Ambulatory Visit (INDEPENDENT_AMBULATORY_CARE_PROVIDER_SITE_OTHER): Payer: Medicare Other | Admitting: Family Medicine

## 2021-04-15 VITALS — BP 121/66 | HR 78 | Temp 97.7°F | Ht 69.0 in | Wt 223.0 lb

## 2021-04-15 DIAGNOSIS — R7303 Prediabetes: Secondary | ICD-10-CM | POA: Diagnosis not present

## 2021-04-15 DIAGNOSIS — E669 Obesity, unspecified: Secondary | ICD-10-CM

## 2021-04-15 DIAGNOSIS — E7849 Other hyperlipidemia: Secondary | ICD-10-CM | POA: Diagnosis not present

## 2021-04-15 DIAGNOSIS — Z6833 Body mass index (BMI) 33.0-33.9, adult: Secondary | ICD-10-CM

## 2021-04-15 DIAGNOSIS — E559 Vitamin D deficiency, unspecified: Secondary | ICD-10-CM | POA: Diagnosis not present

## 2021-04-15 DIAGNOSIS — Z6836 Body mass index (BMI) 36.0-36.9, adult: Secondary | ICD-10-CM

## 2021-04-16 NOTE — Progress Notes (Signed)
Chief Complaint:   OBESITY Stephen Oconnell is here to discuss his progress with his obesity treatment plan along with follow-up of his obesity related diagnoses. Stephen Oconnell is on following a lower carbohydrate, vegetable and lean protein rich diet plan and states he is following his eating plan approximately 85-90% of the time. Stephen Oconnell states he is walking for 60 minutes 3-4 times per week.  Today's visit was #: 25 Starting weight: 248 lbs Starting date: 12/20/2019 Today's weight: 223 lbs Today's date: 04/15/2021 Total lbs lost to date: 25 Total lbs lost since last in-office visit: 4  Interim History: Stephen Oconnell is doing well since his last visit with following a low carbohydrate plan. He enjoys his meal plan, but he misses popcorn. He is trying to drink enough water. He denies headache.  Subjective:   1. Other hyperlipidemia Stephen Oconnell is taking Crestor 5 mg, and he denies side effects. His las LDL was impressively low. I discussed labs with the patient today.  2. Pre-diabetes Stephen Oconnell last A1c was 5.5, stable. He was on metformin in the past but it was discontinued by Cardiology. I discussed labs with the patient today.  3. Vitamin D deficiency Stephen Oconnell last Vitamin D level was 51.2. I discussed labs with the patient today.  Assessment/Plan:   1. Other hyperlipidemia Cardiovascular risk and specific lipid/LDL goals reviewed. We discussed several lifestyle modifications today. I asked the patient to discuss and review labs with Cardiology. Stephen Oconnell will continue to work on diet, exercise and weight loss efforts. Orders and follow up as documented in patient record.   2. Pre-diabetes Stephen Oconnell will continue working on dietary changes, exercise, and weight loss to help decrease the risk of diabetes.   3. Vitamin D deficiency Low Vitamin D level contributes to fatigue and are associated with obesity, breast, and colon cancer. Stephen Oconnell will take Vitamin D OTC 5,000 IU 3 times per week. He will follow-up for routine testing of  Vitamin D, at least 2-3 times per year to avoid over-replacement.  4. Obesity BMI today is 33.0 Stephen Oconnell is currently in the action stage of change. As such, his goal is to continue with weight loss efforts. He has agreed to the Category 3 Plan or following a lower carbohydrate, vegetable and lean protein rich diet plan.   Exercise goals: As is.  Behavioral modification strategies: increasing lean protein intake and increasing water intake.  Stephen Oconnell has agreed to follow-up with our clinic in 4 weeks. He was informed of the importance of frequent follow-up visits to maximize his success with intensive lifestyle modifications for his multiple health conditions.   Objective:   Blood pressure 121/66, pulse 78, temperature 97.7 F (36.5 C), height 5\' 9"  (1.753 m), weight 223 lb (101.2 kg), SpO2 99 %. Body mass index is 32.93 kg/m.  General: Cooperative, alert, well developed, in no acute distress. HEENT: Conjunctivae and lids unremarkable. Cardiovascular: Regular rhythm.  Lungs: Normal work of breathing. Neurologic: No focal deficits.   Lab Results  Component Value Date   CREATININE 1.16 03/19/2021   BUN 24 03/19/2021   NA 140 03/19/2021   K 4.8 03/19/2021   CL 98 03/19/2021   CO2 28 03/19/2021   Lab Results  Component Value Date   ALT 20 03/19/2021   AST 17 03/19/2021   ALKPHOS 56 03/19/2021   BILITOT 0.6 03/19/2021   Lab Results  Component Value Date   HGBA1C 5.5 03/19/2021   HGBA1C 5.5 09/18/2020   HGBA1C 5.6 04/08/2020   HGBA1C 6.0 (H) 12/20/2019  Lab Results  Component Value Date   INSULIN 15.1 03/19/2021   INSULIN 17.4 09/18/2020   INSULIN 17.2 04/08/2020   INSULIN 29.5 (H) 12/20/2019   Lab Results  Component Value Date   TSH 2.450 12/20/2019   Lab Results  Component Value Date   CHOL 90 (L) 03/19/2021   HDL 48 03/19/2021   LDLCALC 28 03/19/2021   TRIG 61 03/19/2021   Lab Results  Component Value Date   VD25OH 51.2 03/19/2021   VD25OH 91.9 09/18/2020    VD25OH 72.8 04/08/2020   Lab Results  Component Value Date   WBC 6.0 12/20/2019   HGB 16.6 12/20/2019   HCT 49.4 12/20/2019   MCV 96 12/20/2019   PLT 209 12/20/2019   No results found for: IRON, TIBC, FERRITIN  Attestation Statements:   Reviewed by clinician on day of visit: allergies, medications, problem list, medical history, surgical history, family history, social history, and previous encounter notes.  Time spent on visit including pre-visit chart review and post-visit care and charting was 30 minutes.    I, Trixie Dredge, am acting as transcriptionist for [name], MD.  I have reviewed the above documentation for accuracy and completeness, and I agree with the above. -  [name], MD

## 2021-04-19 IMAGING — CT CT CARDIAC CORONARY ARTERY CALCIUM SCORE
3 series · 14 of 20 positions shown, 16 images · non-contrast
Comparison: None available.

CLINICAL DATA: 73-year-old male. Caucasian. Family history of
coronary disease.

EXAM:
CT CARDIAC CORONARY ARTERY CALCIUM SCORE
TECHNIQUE: Non-contrast imaging through the heart was performed using
prospective ECG gating. Image post processing was performed on an
independent workstation, allowing for quantitative analysis of the
heart and coronary arteries. Note that this exam targets the heart
and the chest was not imaged in its entirety.

[Series 2: calcium scoring 2.00 qr36 bestdiast 70% hrt calciu · axial · 0.49mm/px · z∈[+1572,+1644]mm · 4 of 60 slices shown]
[im 12/60  vessel]
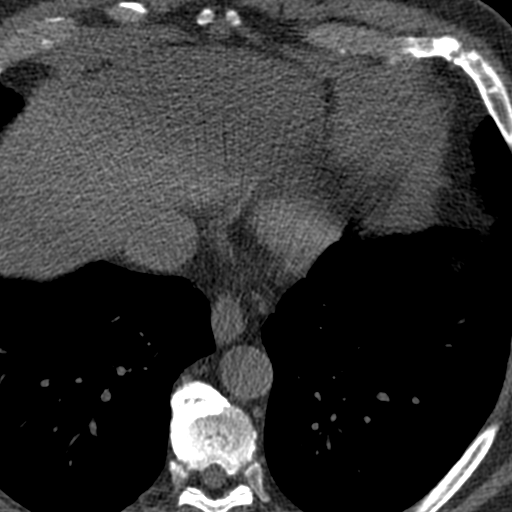
[im 24/60  vessel]
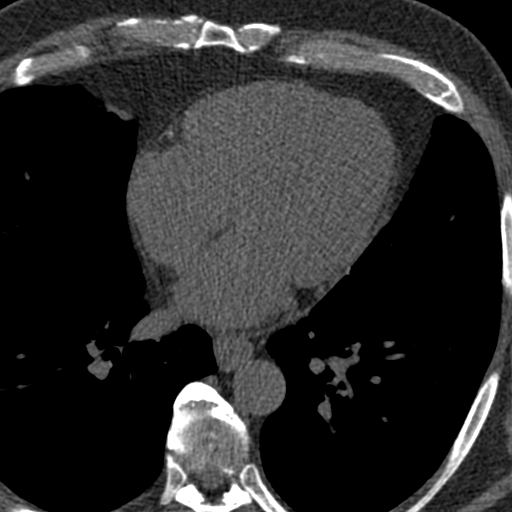
[im 36/60  vessel]
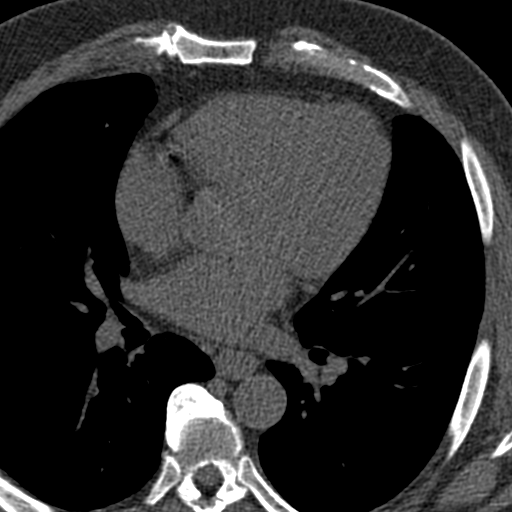
[im 48/60  vessel]
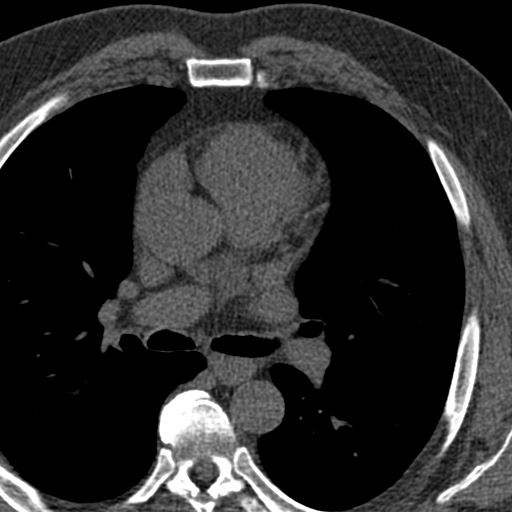

[Series 3: calcium scoring 2.00 br40 bestdiast 70% axial · axial · 0.70mm/px · z∈[+1568,+1648]mm · 5 of 60 slices shown, 7 images]
[im 10/60  vessel]
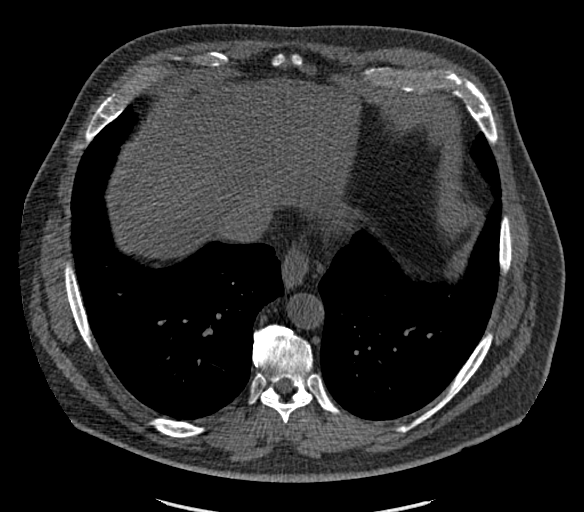
[im 10/60  lung]
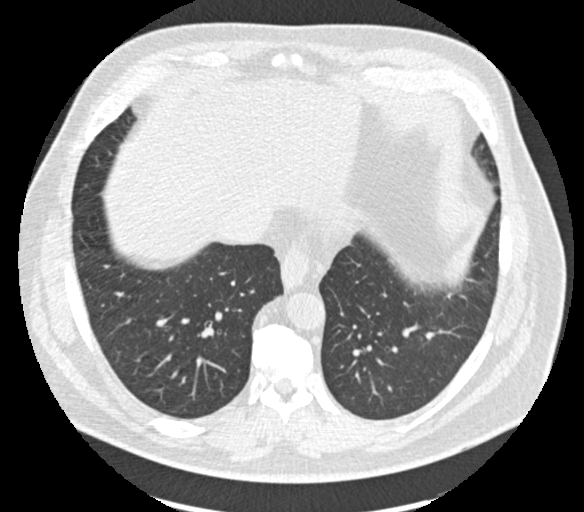
[im 20/60  vessel]
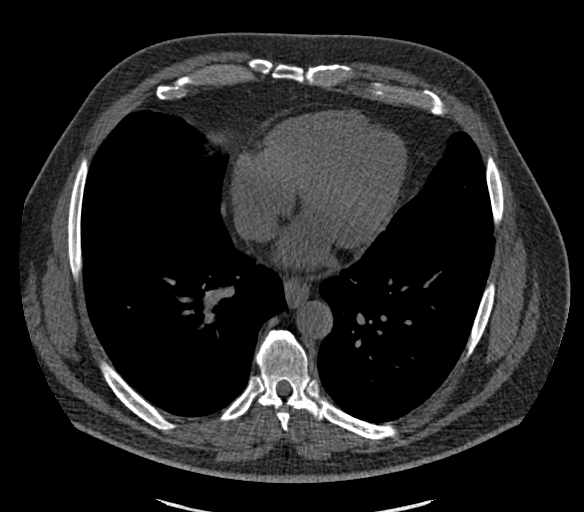
[im 30/60  vessel]
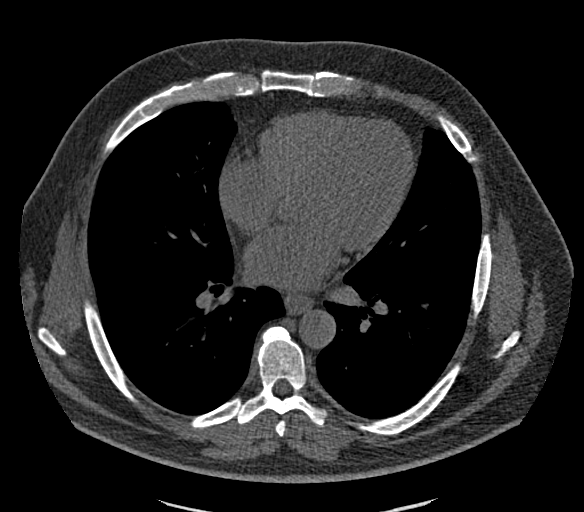
[im 40/60  vessel]
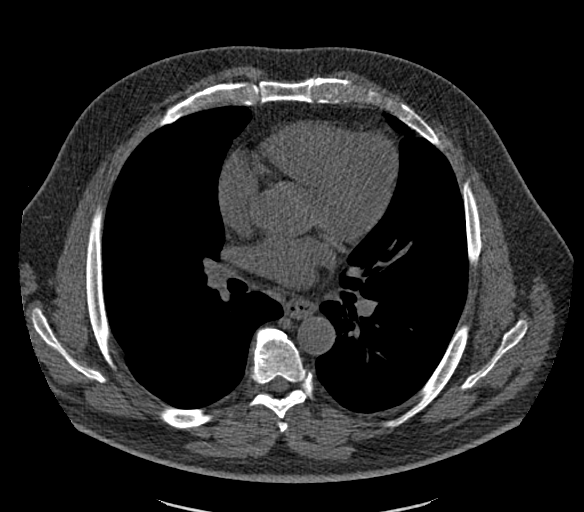
[im 50/60  vessel]
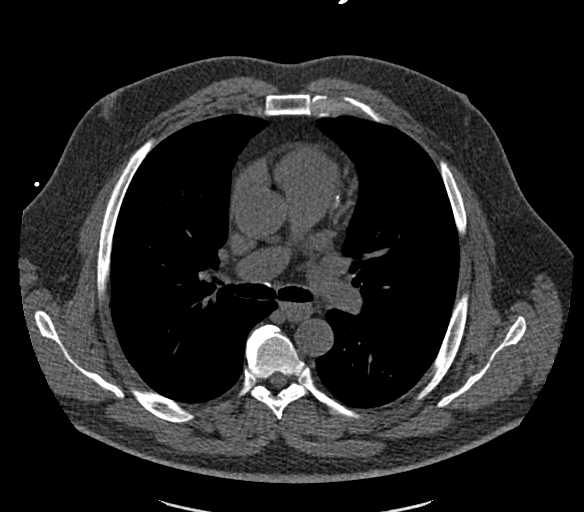
[im 50/60  lung]
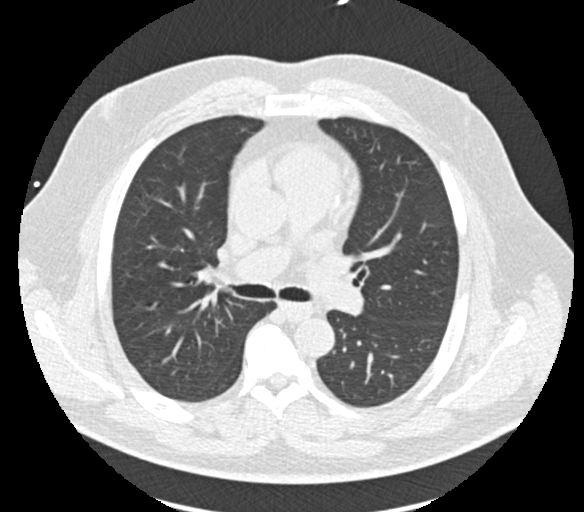

[Series 9: calcium scoring 2.00 br60 bestdiast 70% lungs · axial · 0.70mm/px · z∈[+1568,+1648]mm · 5 of 60 slices shown]
[im 10/60  vessel]
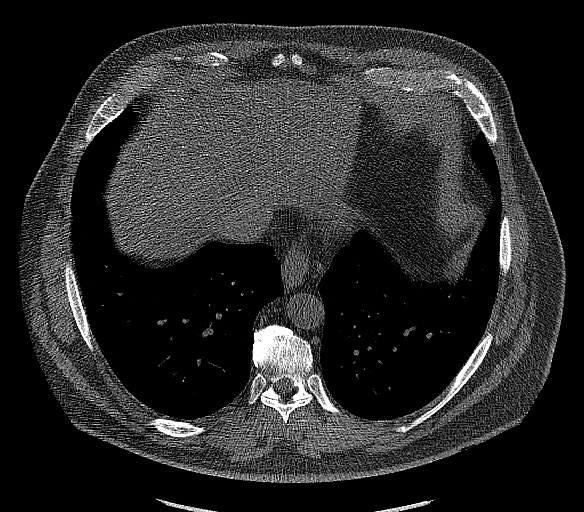
[im 20/60  vessel]
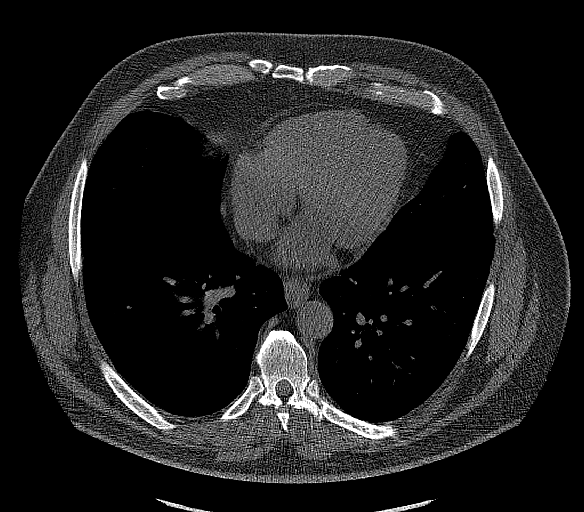
[im 30/60  vessel]
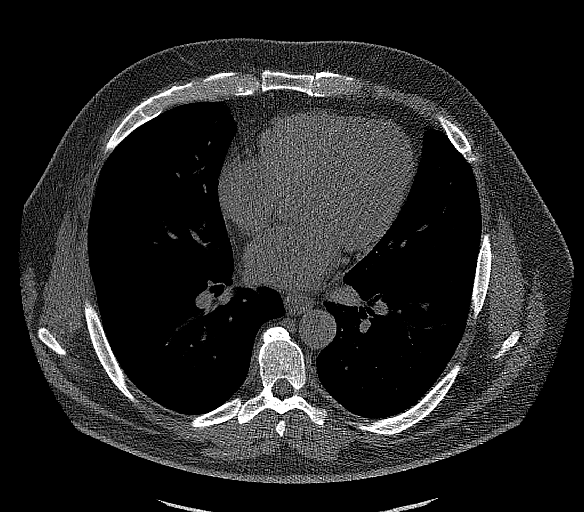
[im 40/60  vessel]
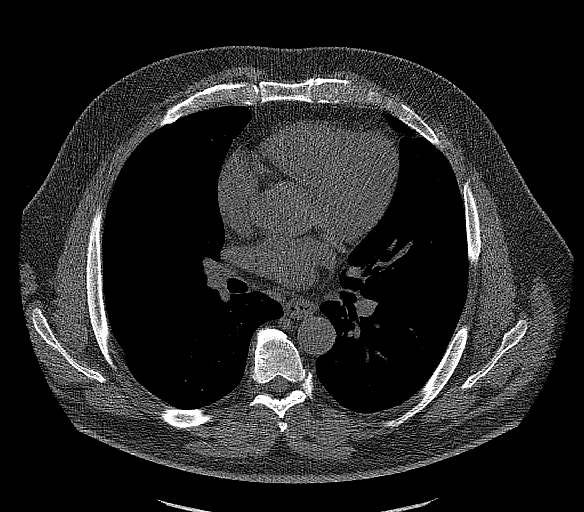
[im 50/60  vessel]
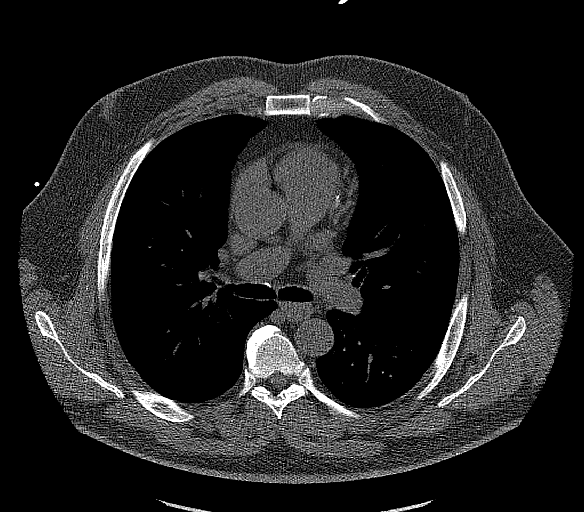

[14 of 20 positions shown; findings below may reference images not displayed]

FINDINGS: Technical quality: Mild cardiac motion degradation.

CORONARY CALCIUM SCORES:

Left Main: No coronary artery calcification

LAD: 116

LCx: 22

RCA: No coronary calcification

CORONARY CALCIUM

Total Agatston Score: 138

[HOSPITAL] percentile: 47

Ascending aorta (normal <  40 mm): 35 mm

EXTRACARDIAC FINDINGS:

Limited view of the lung parenchyma demonstrates no suspicious
nodularity. Airways are normal.

Limited view of the mediastinum demonstrates no adenopathy.
Esophagus normal.

Limited view of the upper abdomen unremarkable.

Limited view of the skeleton and chest wall is unremarkable.
Degenerative osteophytosis of the spine.
IMPRESSION: 1. LAD and RIGHT coronary artery calcification.

2. Total Agatston Score: 138

3. MESA age and sex matched database percentile: 47

## 2021-05-14 ENCOUNTER — Other Ambulatory Visit: Payer: Self-pay

## 2021-05-14 ENCOUNTER — Encounter (INDEPENDENT_AMBULATORY_CARE_PROVIDER_SITE_OTHER): Payer: Self-pay | Admitting: Family Medicine

## 2021-05-14 ENCOUNTER — Ambulatory Visit (INDEPENDENT_AMBULATORY_CARE_PROVIDER_SITE_OTHER): Payer: Medicare Other | Admitting: Family Medicine

## 2021-05-14 VITALS — BP 139/61 | HR 61 | Temp 97.7°F | Ht 69.0 in | Wt 222.0 lb

## 2021-05-14 DIAGNOSIS — E669 Obesity, unspecified: Secondary | ICD-10-CM | POA: Diagnosis not present

## 2021-05-14 DIAGNOSIS — E65 Localized adiposity: Secondary | ICD-10-CM | POA: Diagnosis not present

## 2021-05-14 DIAGNOSIS — Z6832 Body mass index (BMI) 32.0-32.9, adult: Secondary | ICD-10-CM | POA: Diagnosis not present

## 2021-05-14 DIAGNOSIS — R7303 Prediabetes: Secondary | ICD-10-CM | POA: Diagnosis not present

## 2021-05-18 NOTE — Progress Notes (Signed)
? ? ? ?Chief Complaint:  ? ?OBESITY ?Stephen Oconnell is here to discuss his progress with his obesity treatment plan along with follow-up of his obesity related diagnoses. Stephen Oconnell is on the Category 3 Plan or following a lower carbohydrate, vegetable and lean protein rich diet plan and states he is following his eating plan approximately 75% of the time. Stephen Oconnell states he is walking for 60 minutes 3-4 times per week. ? ?Today's visit was #: 26 ?Starting weight: 248 lbs ?Starting date: 12/20/2019 ?Today's weight: 222 lbs ?Today's date: 05/18/2021 ?Total lbs lost to date: 1 ?Total lbs lost since last in-office visit: 1 ? ?Interim History: Stephen Oconnell is alternating between Category 3 and Lower carbohydrate plan.he finds it challenging to follow the low carb 100%. He notices some cravings ? ?Subjective:  ? ?1. Pre-diabetes ?Stephen Oconnell's last A1c was 5.5 and fasting insulin was elevated at 15.1. He was taking metformin in the past and stopped in November by Cardiology. He denies side effects while taking it. ? ?2. Visceral obesity ?Stephen Oconnell's last visceral fat was 20%.  ? ?Assessment/Plan:  ? ?1. Pre-diabetes ?Stephen Oconnell agreed to restart metformin 500 mg daily with breakfast. Side effects were discussed.  ? ?2. Visceral obesity ?Stephen Oconnell will continue working on dietary changes, exercise, and weight loss. ? ?3. Obesity BMI today is 32.9 ?Stephen Oconnell is currently in the action stage of change. As such, his goal is to continue with weight loss efforts. He has agreed to the Category 3 Plan.  ? ?Exercise goals: As is. ? ?Behavioral modification strategies: increasing lean protein intake, increasing water intake, no skipping meals, and meal planning and cooking strategies. ? ?Stephen Oconnell has agreed to follow-up with our clinic in 4 weeks. He was informed of the importance of frequent follow-up visits to maximize his success with intensive lifestyle modifications for his multiple health conditions.  ? ?Objective:  ? ?Blood pressure 139/61, pulse 61, temperature 97.7 ?F (36.5 ?C),  height '5\' 9"'$  (1.753 m), weight 222 lb (100.7 kg), SpO2 97 %. ?Body mass index is 32.78 kg/m?. ? ?General: Cooperative, alert, well developed, in no acute distress. ?HEENT: Conjunctivae and lids unremarkable. ?Cardiovascular: Regular rhythm.  ?Lungs: Normal work of breathing. ?Neurologic: No focal deficits.  ? ?Lab Results  ?Component Value Date  ? CREATININE 1.16 03/19/2021  ? BUN 24 03/19/2021  ? NA 140 03/19/2021  ? K 4.8 03/19/2021  ? CL 98 03/19/2021  ? CO2 28 03/19/2021  ? ?Lab Results  ?Component Value Date  ? ALT 20 03/19/2021  ? AST 17 03/19/2021  ? ALKPHOS 56 03/19/2021  ? BILITOT 0.6 03/19/2021  ? ?Lab Results  ?Component Value Date  ? HGBA1C 5.5 03/19/2021  ? HGBA1C 5.5 09/18/2020  ? HGBA1C 5.6 04/08/2020  ? HGBA1C 6.0 (H) 12/20/2019  ? ?Lab Results  ?Component Value Date  ? INSULIN 15.1 03/19/2021  ? INSULIN 17.4 09/18/2020  ? INSULIN 17.2 04/08/2020  ? INSULIN 29.5 (H) 12/20/2019  ? ?Lab Results  ?Component Value Date  ? TSH 2.450 12/20/2019  ? ?Lab Results  ?Component Value Date  ? CHOL 90 (L) 03/19/2021  ? HDL 48 03/19/2021  ? [place] 28 03/19/2021  ? TRIG 61 03/19/2021  ? ?Lab Results  ?Component Value Date  ? VD25OH 51.2 03/19/2021  ? VD25OH 91.9 09/18/2020  ? VD25OH 72.8 04/08/2020  ? ?Lab Results  ?Component Value Date  ? WBC 6.0 12/20/2019  ? HGB 16.6 12/20/2019  ? HCT 49.4 12/20/2019  ? MCV 96 12/20/2019  ? PLT 209 12/20/2019  ? ?  No results found for: IRON, TIBC, FERRITIN ? ?Attestation Statements:  ? ?Reviewed by clinician on day of visit: allergies, medications, problem list, medical history, surgical history, family history, social history, and previous encounter notes. ? ?Time spent on visit including pre-visit chart review and post-visit care and charting was 30 minutes.  ? ? ?I, Trixie Dredge, am acting as transcriptionist for [name], MD. ? ?I have reviewed the above documentation for accuracy and completeness, and I agree with the above. -  [name], MD ? ? ?

## 2021-06-16 ENCOUNTER — Encounter (INDEPENDENT_AMBULATORY_CARE_PROVIDER_SITE_OTHER): Payer: Self-pay | Admitting: Family Medicine

## 2021-06-16 ENCOUNTER — Ambulatory Visit (INDEPENDENT_AMBULATORY_CARE_PROVIDER_SITE_OTHER): Payer: Medicare Other | Admitting: Family Medicine

## 2021-06-16 VITALS — BP 135/66 | HR 68 | Temp 97.8°F | Ht 69.0 in | Wt 226.0 lb

## 2021-06-16 DIAGNOSIS — E669 Obesity, unspecified: Secondary | ICD-10-CM

## 2021-06-16 DIAGNOSIS — R7303 Prediabetes: Secondary | ICD-10-CM

## 2021-06-16 DIAGNOSIS — Z6833 Body mass index (BMI) 33.0-33.9, adult: Secondary | ICD-10-CM

## 2021-06-16 DIAGNOSIS — I10 Essential (primary) hypertension: Secondary | ICD-10-CM

## 2021-06-16 MED ORDER — METFORMIN HCL 500 MG PO TABS: 500.0000 mg | ORAL_TABLET | Freq: Two times a day (BID) | ORAL | 3 refills | Status: DC

## 2021-06-18 NOTE — Progress Notes (Signed)
? ? ? ?Chief Complaint:  ? ?OBESITY ?Stephen Oconnell is here to discuss his progress with his obesity treatment plan along with follow-up of his obesity related diagnoses. Abigail is on the Category 3 Plan and states he is following his eating plan approximately 75% of the time. Torben states he is walking for 45-60 minutes 3-4 times per week. ? ?Today's visit was #: 110 ?Starting weight: 248 lbs ?Starting date: 12/20/2019 ?Today's weight: 226 lbs ?Today's date: 06/16/2021 ?Total lbs lost to date: 24 ?Total lbs lost since last in-office visit: 0 ? ?Interim History: Sebastian has struggled more with weight gain recently. He is doing well with his meals. He notes snacking has been a problem recently.  ? ?Subjective:  ? ?1. Pre-diabetes ?Veto restarted metformin and he is working on getting back on track with his eating plan and weight loss efforts.  ? ?2. Essential hypertension ?Dan's blood pressure is stable on his medications today. No signs of hypotension. He continues to work on diet, exercise, and weight loss.  ? ?Assessment/Plan:  ? ?1. Pre-diabetes ?We will refill metformin for 1 month. Keric will continue working on his diet and weight loss, and decrease snacking.  ? ?- metFORMIN (GLUCOPHAGE) 500 MG tablet; Take 1 tablet (500 mg total) by mouth 2 (two) times daily with a meal.  Dispense: 180 tablet; Refill: 3 ? ?2. Essential hypertension ?Trek will continue Cozaar, his diet and exercise, and we will continue to monitor.  ? ?3. Obesity BMI today is 33.5 ?Raydel is currently in the action stage of change. As such, his goal is to continue with weight loss efforts. He has agreed to the Category 3 Plan.  ? ?Exercise goals: As is.  ? ?Behavioral modification strategies: increasing lean protein intake, ways to avoid night time snacking, and better snacking choices. ? ?Dartanyon has agreed to follow-up with our clinic in 2 weeks. He was informed of the importance of frequent follow-up visits to maximize his success with intensive lifestyle  modifications for his multiple health conditions.  ? ?Objective:  ? ?Blood pressure 135/66, pulse 68, temperature 97.8 ?F (36.6 ?C), height '5\' 9"'$  (1.753 m), weight 226 lb (102.5 kg), SpO2 96 %. ?Body mass index is 33.37 kg/m?. ? ?General: Cooperative, alert, well developed, in no acute distress. ?HEENT: Conjunctivae and lids unremarkable. ?Cardiovascular: Regular rhythm.  ?Lungs: Normal work of breathing. ?Neurologic: No focal deficits.  ? ?Lab Results  ?Component Value Date  ? CREATININE 1.16 03/19/2021  ? BUN 24 03/19/2021  ? NA 140 03/19/2021  ? K 4.8 03/19/2021  ? CL 98 03/19/2021  ? CO2 28 03/19/2021  ? ?Lab Results  ?Component Value Date  ? ALT 20 03/19/2021  ? AST 17 03/19/2021  ? ALKPHOS 56 03/19/2021  ? BILITOT 0.6 03/19/2021  ? ?Lab Results  ?Component Value Date  ? HGBA1C 5.5 03/19/2021  ? HGBA1C 5.5 09/18/2020  ? HGBA1C 5.6 04/08/2020  ? HGBA1C 6.0 (H) 12/20/2019  ? ?Lab Results  ?Component Value Date  ? INSULIN 15.1 03/19/2021  ? INSULIN 17.4 09/18/2020  ? INSULIN 17.2 04/08/2020  ? INSULIN 29.5 (H) 12/20/2019  ? ?Lab Results  ?Component Value Date  ? TSH 2.450 12/20/2019  ? ?Lab Results  ?Component Value Date  ? CHOL 90 (L) 03/19/2021  ? HDL 48 03/19/2021  ? [place] 28 03/19/2021  ? TRIG 61 03/19/2021  ? ?Lab Results  ?Component Value Date  ? VD25OH 51.2 03/19/2021  ? VD25OH 91.9 09/18/2020  ? VD25OH 72.8 04/08/2020  ? ?Lab  Results  ?Component Value Date  ? WBC 6.0 12/20/2019  ? HGB 16.6 12/20/2019  ? HCT 49.4 12/20/2019  ? MCV 96 12/20/2019  ? PLT 209 12/20/2019  ? ?No results found for: IRON, TIBC, FERRITIN ? ?Obesity Behavioral Intervention:  ? ?Approximately 15 minutes were spent on the discussion below. ? ?ASK: ?We discussed the diagnosis of obesity with [name] today and Bookert agreed to give [name] permission to discuss obesity behavioral modification therapy today. ? ?ASSESS: ?Jacinto has the diagnosis of obesity and his BMI today is 33.5. Johnwesley is in the action stage of change.  ? ?ADVISE: ?Kendarius was  educated on the multiple health risks of obesity as well as the benefit of weight loss to improve his health. He was advised of the need for long term treatment and the importance of lifestyle modifications to improve his current health and to decrease his risk of future health problems. ? ?AGREE: ?Multiple dietary modification options and treatment options were discussed and Harout agreed to follow the recommendations documented in the above note. ? ?ARRANGE: ?Jackston was educated on the importance of frequent visits to treat obesity as outlined per CMS and USPSTF guidelines and agreed to schedule his next follow up appointment today. ? ?Attestation Statements:  ? ?Reviewed by clinician on day of visit: allergies, medications, problem list, medical history, surgical history, family history, social history, and previous encounter notes. ? ? ?I, Trixie Dredge, am acting as transcriptionist for [name], MD. ? ?I have reviewed the above documentation for accuracy and completeness, and I agree with the above. -  [name], MD ? ? ?

## 2021-06-30 ENCOUNTER — Encounter (INDEPENDENT_AMBULATORY_CARE_PROVIDER_SITE_OTHER): Payer: Self-pay | Admitting: Family Medicine

## 2021-06-30 ENCOUNTER — Ambulatory Visit (INDEPENDENT_AMBULATORY_CARE_PROVIDER_SITE_OTHER): Payer: Medicare Other | Admitting: Family Medicine

## 2021-06-30 VITALS — BP 120/62 | HR 68 | Temp 97.8°F | Ht 69.0 in | Wt 226.0 lb

## 2021-06-30 DIAGNOSIS — Z6833 Body mass index (BMI) 33.0-33.9, adult: Secondary | ICD-10-CM

## 2021-06-30 DIAGNOSIS — R7303 Prediabetes: Secondary | ICD-10-CM

## 2021-06-30 DIAGNOSIS — E669 Obesity, unspecified: Secondary | ICD-10-CM

## 2021-06-30 DIAGNOSIS — Z6836 Body mass index (BMI) 36.0-36.9, adult: Secondary | ICD-10-CM

## 2021-07-13 NOTE — Progress Notes (Signed)
? ? ? ?Chief Complaint:  ? ?OBESITY ?Trevious is here to discuss his progress with his obesity treatment plan along with follow-up of his obesity related diagnoses. Law is on the Category 3 Plan and states he is following his eating plan approximately 80-85% of the time. Keano states he is walking for 45-60 minutes 3-4 times per week. ? ?Today's visit was #: 28 ?Starting weight: 248 lbs ?Starting date: 12/20/2019 ?Today's weight: 226 lbs ?Today's date: 06/30/2021 ?Total lbs lost to date: 31 ?Total lbs lost since last in-office visit: 0 ? ?Interim History: Alekxander is retaining a bit of fluid today, and he has done well with weight loss. He is dong well with meal planning and following his Category 3 plan. ? ?Subjective:  ? ?1. Pre-diabetes ?Michal is stable on metformin and he is doing well on his diet. He notes decreased polyphagia. ? ?Assessment/Plan:  ? ?1. Pre-diabetes ?Markas will continue with his metformin, and diet, and we will continue to follow.  ? ?2. Obesity BMI today is 33.4 ?Velmer is currently in the action stage of change. As such, his goal is to continue with weight loss efforts. He has agreed to the Category 3 Plan.  ? ?Exercise goals: As is. ? ?Behavioral modification strategies: better snacking choices. ? ?Wolf has agreed to follow-up with our clinic in 2 to 3 weeks. He was informed of the importance of frequent follow-up visits to maximize his success with intensive lifestyle modifications for his multiple health conditions.  ? ?Objective:  ? ?Blood pressure 120/62, pulse 68, temperature 97.8 ?F (36.6 ?C), height '5\' 9"'$  (1.753 m), weight 226 lb (102.5 kg), SpO2 96 %. ?Body mass index is 33.37 kg/m?. ? ?General: Cooperative, alert, well developed, in no acute distress. ?HEENT: Conjunctivae and lids unremarkable. ?Cardiovascular: Regular rhythm.  ?Lungs: Normal work of breathing. ?Neurologic: No focal deficits.  ? ?Lab Results  ?Component Value Date  ? CREATININE 1.16 03/19/2021  ? BUN 24 03/19/2021  ? NA 140  03/19/2021  ? K 4.8 03/19/2021  ? CL 98 03/19/2021  ? CO2 28 03/19/2021  ? ?Lab Results  ?Component Value Date  ? ALT 20 03/19/2021  ? AST 17 03/19/2021  ? ALKPHOS 56 03/19/2021  ? BILITOT 0.6 03/19/2021  ? ?Lab Results  ?Component Value Date  ? HGBA1C 5.5 03/19/2021  ? HGBA1C 5.5 09/18/2020  ? HGBA1C 5.6 04/08/2020  ? HGBA1C 6.0 (H) 12/20/2019  ? ?Lab Results  ?Component Value Date  ? INSULIN 15.1 03/19/2021  ? INSULIN 17.4 09/18/2020  ? INSULIN 17.2 04/08/2020  ? INSULIN 29.5 (H) 12/20/2019  ? ?Lab Results  ?Component Value Date  ? TSH 2.450 12/20/2019  ? ?Lab Results  ?Component Value Date  ? CHOL 90 (L) 03/19/2021  ? HDL 48 03/19/2021  ? [place] 28 03/19/2021  ? TRIG 61 03/19/2021  ? ?Lab Results  ?Component Value Date  ? VD25OH 51.2 03/19/2021  ? VD25OH 91.9 09/18/2020  ? VD25OH 72.8 04/08/2020  ? ?Lab Results  ?Component Value Date  ? WBC 6.0 12/20/2019  ? HGB 16.6 12/20/2019  ? HCT 49.4 12/20/2019  ? MCV 96 12/20/2019  ? PLT 209 12/20/2019  ? ?No results found for: IRON, TIBC, FERRITIN ? ?Attestation Statements:  ? ?Reviewed by clinician on day of visit: allergies, medications, problem list, medical history, surgical history, family history, social history, and previous encounter notes. ? ?Time spent on visit including pre-visit chart review and post-visit care and charting was 20 minutes.  ? ? ?I, Trixie Dredge,  am acting as Location manager for [name], MD. ? ?I have reviewed the above documentation for accuracy and completeness, and I agree with the above. -  [name], MD ? ? ?

## 2021-07-14 ENCOUNTER — Encounter (INDEPENDENT_AMBULATORY_CARE_PROVIDER_SITE_OTHER): Payer: Self-pay | Admitting: Family Medicine

## 2021-07-14 ENCOUNTER — Ambulatory Visit (INDEPENDENT_AMBULATORY_CARE_PROVIDER_SITE_OTHER): Payer: Medicare Other | Admitting: Family Medicine

## 2021-07-14 VITALS — BP 104/57 | HR 64 | Temp 98.2°F | Ht 69.0 in | Wt 225.0 lb

## 2021-07-14 DIAGNOSIS — E669 Obesity, unspecified: Secondary | ICD-10-CM

## 2021-07-14 DIAGNOSIS — Z6833 Body mass index (BMI) 33.0-33.9, adult: Secondary | ICD-10-CM | POA: Diagnosis not present

## 2021-07-14 DIAGNOSIS — R7303 Prediabetes: Secondary | ICD-10-CM

## 2021-07-21 ENCOUNTER — Encounter: Payer: Self-pay | Admitting: Internal Medicine

## 2021-07-29 NOTE — Progress Notes (Signed)
Chief Complaint:   OBESITY Stephen Oconnell is here to discuss his progress with his obesity treatment plan along with follow-up of his obesity related diagnoses. Stephen Oconnell is on the Category 3 Plan and states he is following his eating plan approximately 80% of the time. Stephen Oconnell states he is walking for 50 minutes 3-4 times per week.  Today's visit was #: 28 Starting weight: 248 lbs Starting date: 12/20/2019 Today's weight: 225 lbs Today's date: 07/14/2021 Total lbs lost to date: 23 Total lbs lost since last in-office visit: 1  Interim History: Stephen Oconnell continues to do well with weight loss. His hunger is controlled and he is working on his diet. He will be traveling soon and he is open to discussing travel eating strategies.   Subjective:   1. Pre-diabetes Stephen Oconnell is stable on metformin, and he is doing well with his diet and exercise. No side effects were noted.   Assessment/Plan:   1. Pre-diabetes Stephen Oconnell will continue with his diet and exercise, and metformin. We will recheck labs in 2 months.   2. Obesity, Current BMI 33.3 Stephen Oconnell is currently in the action stage of change. As such, his goal is to continue with weight loss efforts. He has agreed to the Category 3 Plan.   Exercise goals: As is.  Behavioral modification strategies: increasing lean protein intake and travel eating strategies.  Stephen Oconnell has agreed to follow-up with our clinic in 4 weeks. He was informed of the importance of frequent follow-up visits to maximize his success with intensive lifestyle modifications for his multiple health conditions.   Objective:   Blood pressure (!) 104/57, pulse 64, temperature 98.2 F (36.8 C), height '5\' 9"'$  (1.753 m), weight 225 lb (102.1 kg), SpO2 96 %. Body mass index is 33.23 kg/m.  General: Cooperative, alert, well developed, in no acute distress. HEENT: Conjunctivae and lids unremarkable. Cardiovascular: Regular rhythm.  Lungs: Normal work of breathing. Neurologic: No focal deficits.   Lab  Results  Component Value Date   CREATININE 1.16 03/19/2021   BUN 24 03/19/2021   NA 140 03/19/2021   K 4.8 03/19/2021   CL 98 03/19/2021   CO2 28 03/19/2021   Lab Results  Component Value Date   ALT 20 03/19/2021   AST 17 03/19/2021   ALKPHOS 56 03/19/2021   BILITOT 0.6 03/19/2021   Lab Results  Component Value Date   HGBA1C 5.5 03/19/2021   HGBA1C 5.5 09/18/2020   HGBA1C 5.6 04/08/2020   HGBA1C 6.0 (H) 12/20/2019   Lab Results  Component Value Date   INSULIN 15.1 03/19/2021   INSULIN 17.4 09/18/2020   INSULIN 17.2 04/08/2020   INSULIN 29.5 (H) 12/20/2019   Lab Results  Component Value Date   TSH 2.450 12/20/2019   Lab Results  Component Value Date   CHOL 90 (L) 03/19/2021   HDL 48 03/19/2021   LDLCALC 28 03/19/2021   TRIG 61 03/19/2021   Lab Results  Component Value Date   VD25OH 51.2 03/19/2021   VD25OH 91.9 09/18/2020   VD25OH 72.8 04/08/2020   Lab Results  Component Value Date   WBC 6.0 12/20/2019   HGB 16.6 12/20/2019   HCT 49.4 12/20/2019   MCV 96 12/20/2019   PLT 209 12/20/2019   No results found for: IRON, TIBC, FERRITIN  Attestation Statements:   Reviewed by clinician on day of visit: allergies, medications, problem list, medical history, surgical history, family history, social history, and previous encounter notes.   Wilhemena Durie, am acting as  transcriptionist for [name], MD.  I have reviewed the above documentation for accuracy and completeness, and I agree with the above. -  [name], MD

## 2021-08-17 ENCOUNTER — Ambulatory Visit (INDEPENDENT_AMBULATORY_CARE_PROVIDER_SITE_OTHER): Payer: Medicare Other | Admitting: Family Medicine

## 2021-08-17 ENCOUNTER — Encounter (INDEPENDENT_AMBULATORY_CARE_PROVIDER_SITE_OTHER): Payer: Self-pay | Admitting: Family Medicine

## 2021-08-17 VITALS — BP 117/62 | HR 63 | Temp 97.8°F | Ht 69.0 in | Wt 230.0 lb

## 2021-08-17 DIAGNOSIS — E669 Obesity, unspecified: Secondary | ICD-10-CM

## 2021-08-17 DIAGNOSIS — Z6834 Body mass index (BMI) 34.0-34.9, adult: Secondary | ICD-10-CM | POA: Diagnosis not present

## 2021-08-17 DIAGNOSIS — I10 Essential (primary) hypertension: Secondary | ICD-10-CM

## 2021-08-18 NOTE — Progress Notes (Unsigned)
Chief Complaint:   OBESITY Stephen Oconnell is here to discuss his progress with his obesity treatment plan along with follow-up of his obesity related diagnoses. Stephen Oconnell is on the Category 3 Plan and states he is following his eating plan approximately 80% of the time. Stephen Oconnell states he is walking for 60 minutes 3-4 times per week.  Today's visit was #: 30 Starting weight: 248 lbs Starting date: 12/20/2019 Today's weight: 230 lbs Today's date: 08/17/2021 Total lbs lost to date: 18 Total lbs lost since last in-office visit: 0  Interim History: Stephen Oconnell has struggled with weight loss in the last month.  He especially struggles with portion control and he has not been weighing his protein as much, although he feels he is meeting his goals overall.  Subjective:   1. Essential hypertension Stephen Oconnell's blood pressure is well controlled on his medications and diet.  He has no signs of hypotension.  Assessment/Plan:   1. Essential hypertension Stephen Oconnell will continue with his weight loss efforts and his medications.  He may be able to decrease the need for his blood pressure medications with continued weight loss.  2. Obesity, Current BMI 34.0 Stephen Oconnell is currently in the action stage of change. As such, his goal is to continue with weight loss efforts. He has agreed to the Category 3 Plan with lunch options.   We will repeat IC at his next visit in 4 weeks.  Exercise goals: As is.   Behavioral modification strategies: increasing lean protein intake.  Stephen Oconnell has agreed to follow-up with our clinic in 2 weeks via telehealth with Stephen Oconnell Oconnell, Stephen Oconnell, and in 4 weeks fasting for IC. He was informed of the importance of frequent follow-up visits to maximize his success with intensive lifestyle modifications for his multiple health conditions.   Objective:   Blood pressure 117/62, pulse 63, temperature 97.8 F (36.6 C), height '5\' 9"'$  (1.753 m), weight 230 lb (104.3 kg), SpO2 96 %. Body mass index is 33.97  kg/m.  General: Cooperative, alert, well developed, in no acute distress. HEENT: Conjunctivae and lids unremarkable. Cardiovascular: Regular rhythm.  Lungs: Normal work of breathing. Neurologic: No focal deficits.   Lab Results  Component Value Date   CREATININE 1.16 03/19/2021   BUN 24 03/19/2021   NA 140 03/19/2021   K 4.8 03/19/2021   CL 98 03/19/2021   CO2 28 03/19/2021   Lab Results  Component Value Date   ALT 20 03/19/2021   AST 17 03/19/2021   ALKPHOS 56 03/19/2021   BILITOT 0.6 03/19/2021   Lab Results  Component Value Date   HGBA1C 5.5 03/19/2021   HGBA1C 5.5 09/18/2020   HGBA1C 5.6 04/08/2020   HGBA1C 6.0 (H) 12/20/2019   Lab Results  Component Value Date   INSULIN 15.1 03/19/2021   INSULIN 17.4 09/18/2020   INSULIN 17.2 04/08/2020   INSULIN 29.5 (H) 12/20/2019   Lab Results  Component Value Date   TSH 2.450 12/20/2019   Lab Results  Component Value Date   CHOL 90 (L) 03/19/2021   HDL 48 03/19/2021   LDLCALC 28 03/19/2021   TRIG 61 03/19/2021   Lab Results  Component Value Date   VD25OH 51.2 03/19/2021   VD25OH 91.9 09/18/2020   VD25OH 72.8 04/08/2020   Lab Results  Component Value Date   WBC 6.0 12/20/2019   HGB 16.6 12/20/2019   HCT 49.4 12/20/2019   MCV 96 12/20/2019   PLT 209 12/20/2019   No results found for: "IRON", "TIBC", "FERRITIN"  Obesity Behavioral Intervention:   Approximately 15 minutes were spent on the discussion below.  ASK: We discussed the diagnosis of obesity with Stephen Oconnell today and Stephen Oconnell agreed to give [name] permission to discuss obesity behavioral modification therapy today.  ASSESS: Stephen Oconnell has the diagnosis of obesity and his BMI today is 34.0. Stephen Oconnell is in the action stage of change.   ADVISE: Stephen Oconnell was educated on the multiple health risks of obesity as well as the benefit of weight loss to improve his health. He was advised of the need for long term treatment and the importance of lifestyle modifications to improve  his current health and to decrease his risk of future health problems.  AGREE: Multiple dietary modification options and treatment options were discussed and Stephen Oconnell agreed to follow the recommendations documented in the above note.  ARRANGE: Stephen Oconnell was educated on the importance of frequent visits to treat obesity as outlined per CMS and USPSTF guidelines and agreed to schedule his next follow up appointment today.  Attestation Statements:   Reviewed by clinician on day of visit: allergies, medications, problem list, medical history, surgical history, family history, social history, and previous encounter notes.   I, Trixie Dredge, am acting as transcriptionist for [name], MD.  I have reviewed the above documentation for accuracy and completeness, and I agree with the above. -  [name], MD

## 2021-08-26 ENCOUNTER — Encounter (INDEPENDENT_AMBULATORY_CARE_PROVIDER_SITE_OTHER): Payer: Self-pay | Admitting: Family Medicine

## 2021-08-26 NOTE — Telephone Encounter (Signed)
Dr.Beasley is OOO this pt is scheduled to see [facility] Monday. Could you please advise.

## 2021-08-27 ENCOUNTER — Encounter (INDEPENDENT_AMBULATORY_CARE_PROVIDER_SITE_OTHER): Payer: Self-pay | Admitting: Family Medicine

## 2021-08-27 NOTE — Progress Notes (Signed)
TeleHealth Visit:  This visit was completed with telemedicine (audio/video) technology. Stephen Oconnell has verbally consented to this TeleHealth visit. The patient is located at home, the provider is located at home. The participants in this visit include the listed provider and patient. The visit was conducted today via MyChart video.  OBESITY Stephen Oconnell is here to discuss his progress with his obesity treatment plan along with follow-up of his obesity related diagnoses.   Today's visit was # 31 Starting weight: 248 lbs Starting date: 12/20/2019 Weight at last in office visit: 230 lbs on 08/17/21 Total weight loss: 21 lbs Today's reported weight: 227 lbs-weight in office on June 26.  Nutrition Plan: the Category 3 Plan with lunch options- 85% adherence  Hunger is well controlled.  Current exercise: Walks for 1 hour 3 to 4 days/week.  Interim History: He notes the last few weeks have gone well and he has been able to adhere to plan 85% of the time. He is traveling for work this week and plans on making healthy food choices. He reports no issues or barriers to adhering to plan other than upcoming travel. He is going to see his son in [place] in July.  He prefers appointments every 2 weeks and is open to alternating virtual visit and in office visits.  He will come in to weigh prior to his virtual visits.  Assessment/Plan:  1. Prediabetes A1c was 6.0 on December 20, 2019 when he started our program.  Current A1c is 5.5. He denies polyphagia. Medication(s): Metformin 500 mg twice daily.  Denies GI upset. Lab Results  Component Value Date   HGBA1C 5.5 03/19/2021   Lab Results  Component Value Date   INSULIN 15.1 03/19/2021   INSULIN 17.4 09/18/2020   INSULIN 17.2 04/08/2020   INSULIN 29.5 (H) 12/20/2019    Plan: Continue metformin 500 mg twice daily.  2. Obesity: Current BMI 33.51 Stephen Oconnell is currently in the action stage of change. As such, his goal is to continue with weight loss  efforts.  He has agreed to the Category 3 Plan with lunch options.  Exercise goals: Continue walking.  Discussed adding in resistance training 2 times weekly using dumbbells or body resistance.  Behavioral modification strategies: travel eating strategies and planning for success.  Stephen Oconnell has agreed to follow-up with our clinic in 2 weeks.   No orders of the defined types were placed in this encounter.   There are no discontinued medications.   No orders of the defined types were placed in this encounter.     Objective:   VITALS: Per patient if applicable, see vitals. GENERAL: Alert and in no acute distress. CARDIOPULMONARY: No increased WOB. Speaking in clear sentences.  PSYCH: Pleasant and cooperative. Speech normal rate and rhythm. Affect is appropriate. Insight and judgement are appropriate. Attention is focused, linear, and appropriate.  NEURO: Oriented as arrived to appointment on time with no prompting.   Lab Results  Component Value Date   CREATININE 1.16 03/19/2021   BUN 24 03/19/2021   NA 140 03/19/2021   K 4.8 03/19/2021   CL 98 03/19/2021   CO2 28 03/19/2021   Lab Results  Component Value Date   ALT 20 03/19/2021   AST 17 03/19/2021   ALKPHOS 56 03/19/2021   BILITOT 0.6 03/19/2021   Lab Results  Component Value Date   HGBA1C 5.5 03/19/2021   HGBA1C 5.5 09/18/2020   HGBA1C 5.6 04/08/2020   HGBA1C 6.0 (H) 12/20/2019   Lab Results  Component Value Date  INSULIN 15.1 03/19/2021   INSULIN 17.4 09/18/2020   INSULIN 17.2 04/08/2020   INSULIN 29.5 (H) 12/20/2019   Lab Results  Component Value Date   TSH 2.450 12/20/2019   Lab Results  Component Value Date   CHOL 90 (L) 03/19/2021   HDL 48 03/19/2021   LDLCALC 28 03/19/2021   TRIG 61 03/19/2021   Lab Results  Component Value Date   WBC 6.0 12/20/2019   HGB 16.6 12/20/2019   HCT 49.4 12/20/2019   MCV 96 12/20/2019   PLT 209 12/20/2019   No results found for: "IRON", "TIBC", "FERRITIN" Lab  Results  Component Value Date   VD25OH 51.2 03/19/2021   VD25OH 91.9 09/18/2020   VD25OH 72.8 04/08/2020    Attestation Statements:   Reviewed by clinician on day of visit: allergies, medications, problem list, medical history, surgical history, family history, social history, and previous encounter notes.

## 2021-08-31 ENCOUNTER — Telehealth (INDEPENDENT_AMBULATORY_CARE_PROVIDER_SITE_OTHER): Payer: Medicare Other | Admitting: Family Medicine

## 2021-08-31 ENCOUNTER — Encounter (INDEPENDENT_AMBULATORY_CARE_PROVIDER_SITE_OTHER): Payer: Self-pay | Admitting: Family Medicine

## 2021-08-31 DIAGNOSIS — Z6833 Body mass index (BMI) 33.0-33.9, adult: Secondary | ICD-10-CM

## 2021-08-31 DIAGNOSIS — E669 Obesity, unspecified: Secondary | ICD-10-CM

## 2021-08-31 DIAGNOSIS — R7303 Prediabetes: Secondary | ICD-10-CM | POA: Diagnosis not present

## 2021-08-31 DIAGNOSIS — Z7984 Long term (current) use of oral hypoglycemic drugs: Secondary | ICD-10-CM | POA: Diagnosis not present

## 2021-09-14 ENCOUNTER — Ambulatory Visit (INDEPENDENT_AMBULATORY_CARE_PROVIDER_SITE_OTHER): Payer: Medicare Other | Admitting: Family Medicine

## 2021-09-14 ENCOUNTER — Encounter (INDEPENDENT_AMBULATORY_CARE_PROVIDER_SITE_OTHER): Payer: Self-pay | Admitting: Family Medicine

## 2021-09-14 VITALS — BP 145/64 | HR 77 | Temp 97.5°F | Ht 69.0 in | Wt 231.0 lb

## 2021-09-14 DIAGNOSIS — I10 Essential (primary) hypertension: Secondary | ICD-10-CM | POA: Insufficient documentation

## 2021-09-14 DIAGNOSIS — Z6834 Body mass index (BMI) 34.0-34.9, adult: Secondary | ICD-10-CM

## 2021-09-14 DIAGNOSIS — E669 Obesity, unspecified: Secondary | ICD-10-CM

## 2021-09-14 NOTE — Progress Notes (Signed)
Chief Complaint:   OBESITY Stephen Oconnell is here to discuss his progress with his obesity treatment plan along with follow-up of his obesity related diagnoses. Stephen Oconnell is on the Category 3 Plan with lunch options and states he is following his eating plan approximately 80% of the time. Stephen Oconnell states he is walking for 60 minutes 3-5 times per week.  Today's visit was #: 69 Starting weight: 248 lbs Starting date: 12/20/2019 Today's weight: 231 lbs Today's date: 09/14/2021 Total lbs lost to date: 17 Total lbs lost since last in-office visit: 0  Interim History: Stephen Oconnell continues to do well with minimizing weight gain.  He will be traveling to visit his family in [place] soon.  He has struggled more with weight loss and he is starting to get a bit frustrated.  Subjective:   1. Essential hypertension Stephen Oconnell's blood pressure is elevated today, which is unusual for him.  He is taking his medications regularly.  He denies chest pain or headache.  Assessment/Plan:   1. Essential hypertension Stephen Oconnell will continue with his diet and exercise, and we will recheck his blood pressure in 1 month.  2. Obesity, Current BMI 34.2 Stephen Oconnell is currently in the action stage of change. As such, his goal is to continue with weight loss efforts. He has agreed to the Category 3 Plan.   We will plan to recheck fasting IC at his next visit.  Exercise goals: As is.  Behavioral modification strategies: increasing water intake and travel eating strategies.  Stephen Oconnell has agreed to follow-up with our clinic in 4 weeks. He was informed of the importance of frequent follow-up visits to maximize his success with intensive lifestyle modifications for his multiple health conditions.   Objective:   Blood pressure (!) 145/64, pulse 77, temperature (!) 97.5 F (36.4 C), height '5\' 9"'$  (1.753 m), weight 231 lb (104.8 kg), SpO2 97 %. Body mass index is 34.11 kg/m.  General: Cooperative, alert, well developed, in no acute distress. HEENT:  Conjunctivae and lids unremarkable. Cardiovascular: Regular rhythm.  Lungs: Normal work of breathing. Neurologic: No focal deficits.   Lab Results  Component Value Date   CREATININE 1.16 03/19/2021   BUN 24 03/19/2021   NA 140 03/19/2021   K 4.8 03/19/2021   CL 98 03/19/2021   CO2 28 03/19/2021   Lab Results  Component Value Date   ALT 20 03/19/2021   AST 17 03/19/2021   ALKPHOS 56 03/19/2021   BILITOT 0.6 03/19/2021   Lab Results  Component Value Date   HGBA1C 5.5 03/19/2021   HGBA1C 5.5 09/18/2020   HGBA1C 5.6 04/08/2020   HGBA1C 6.0 (H) 12/20/2019   Lab Results  Component Value Date   INSULIN 15.1 03/19/2021   INSULIN 17.4 09/18/2020   INSULIN 17.2 04/08/2020   INSULIN 29.5 (H) 12/20/2019   Lab Results  Component Value Date   TSH 2.450 12/20/2019   Lab Results  Component Value Date   CHOL 90 (L) 03/19/2021   HDL 48 03/19/2021   LDLCALC 28 03/19/2021   TRIG 61 03/19/2021   Lab Results  Component Value Date   VD25OH 51.2 03/19/2021   VD25OH 91.9 09/18/2020   VD25OH 72.8 04/08/2020   Lab Results  Component Value Date   WBC 6.0 12/20/2019   HGB 16.6 12/20/2019   HCT 49.4 12/20/2019   MCV 96 12/20/2019   PLT 209 12/20/2019   No results found for: "IRON", "TIBC", "FERRITIN"  Attestation Statements:   Reviewed by clinician on day of  visit: allergies, medications, problem list, medical history, surgical history, family history, social history, and previous encounter notes.  Time spent on visit including pre-visit chart review and post-visit care and charting was 22 minutes.   I, Trixie Dredge, am acting as transcriptionist for [name], MD.  I have reviewed the above documentation for accuracy and completeness, and I agree with the above. -  [name], MD

## 2021-09-28 ENCOUNTER — Telehealth (INDEPENDENT_AMBULATORY_CARE_PROVIDER_SITE_OTHER): Payer: Medicare Other | Admitting: Family Medicine

## 2021-09-30 ENCOUNTER — Telehealth (INDEPENDENT_AMBULATORY_CARE_PROVIDER_SITE_OTHER): Payer: Medicare Other | Admitting: Family Medicine

## 2021-10-13 ENCOUNTER — Ambulatory Visit (INDEPENDENT_AMBULATORY_CARE_PROVIDER_SITE_OTHER): Payer: Medicare Other | Admitting: Family Medicine

## 2021-10-13 ENCOUNTER — Encounter (INDEPENDENT_AMBULATORY_CARE_PROVIDER_SITE_OTHER): Payer: Self-pay | Admitting: Family Medicine

## 2021-10-13 VITALS — BP 125/65 | HR 64 | Temp 97.7°F | Ht 69.0 in | Wt 230.0 lb

## 2021-10-13 DIAGNOSIS — E88819 Insulin resistance, unspecified: Secondary | ICD-10-CM | POA: Insufficient documentation

## 2021-10-13 DIAGNOSIS — E8881 Metabolic syndrome: Secondary | ICD-10-CM

## 2021-10-13 DIAGNOSIS — E669 Obesity, unspecified: Secondary | ICD-10-CM | POA: Diagnosis not present

## 2021-10-13 DIAGNOSIS — Z6834 Body mass index (BMI) 34.0-34.9, adult: Secondary | ICD-10-CM

## 2021-10-13 DIAGNOSIS — R0602 Shortness of breath: Secondary | ICD-10-CM

## 2021-10-13 MED ORDER — METFORMIN HCL 500 MG PO TABS: 500.0000 mg | ORAL_TABLET | Freq: Two times a day (BID) | ORAL | 0 refills | Status: DC

## 2021-10-14 ENCOUNTER — Encounter (INDEPENDENT_AMBULATORY_CARE_PROVIDER_SITE_OTHER): Payer: Self-pay

## 2021-10-20 NOTE — Progress Notes (Signed)
Chief Complaint:   OBESITY Stephen Oconnell is here to discuss his progress with his obesity treatment plan along with follow-up of his obesity related diagnoses. Stephen Oconnell is on the Category 3 Plan and states he is following his eating plan approximately 70% of the time. Stephen Oconnell states he is walking for 45-60 minutes 3-4 times per week.  Today's visit was #: 55 Starting weight: 248 lbs Starting date: 12/20/2019 Today's weight: 230 lbs Today's date: 10/13/2021 Total lbs lost to date: 18 Total lbs lost since last in-office visit: 1  Interim History: Stephen Oconnell continues to do well with weight loss. He was on vacation and did some vacation eating but was able to get back on track.   Subjective:   1. SOB (shortness of breath) on exertion [facility]'s symptoms are not worsening. He is walking regularly due to have IC repeated. He is working on his diet and exercise and uses CPAP regularly.   2. Insulin resistance Stephen Oconnell is stable on metformin, and he denies nausea or vomiting. He is doing well with his diet and decreasing simple carbohydrates, and he notes decreased polyphagia.   Assessment/Plan:   1. SOB (shortness of breath) on exertion IC shown a mild decrease in VO2 and RMR, and is now below his expected level. He was encouraged to increase strengthening exercise to improve.   2. Insulin resistance We will refill metformin for 1 month. Stephen Oconnell will continue to work on weight loss, exercise, and decreasing simple carbohydrates to help decrease the risk of diabetes. Stephen Oconnell agreed to follow-up with [name] as directed to closely monitor his progress.  - metFORMIN (GLUCOPHAGE) 500 MG tablet; Take 1 tablet (500 mg total) by mouth 2 (two) times daily with a meal.  Dispense: 30 tablet; Refill: 0  3. Obesity, Current BMI 34.0 Stephen Oconnell is currently in the action stage of change. As such, his goal is to continue with weight loss efforts. He has agreed to the Category 3 Plan.   Exercise goals: Core strengthening.   Behavioral  modification strategies: increasing lean protein intake.  Stephen Oconnell has agreed to follow-up with our clinic in 2 weeks via virtual and then in 2 weeks in office. He was informed of the importance of frequent follow-up visits to maximize his success with intensive lifestyle modifications for his multiple health conditions.   Objective:   Blood pressure 125/65, pulse 64, temperature 97.7 F (36.5 C), height '5\' 9"'$  (1.753 m), weight 230 lb (104.3 kg), SpO2 94 %. Body mass index is 33.97 kg/m.  General: Cooperative, alert, well developed, in no acute distress. HEENT: Conjunctivae and lids unremarkable. Cardiovascular: Regular rhythm.  Lungs: Normal work of breathing. Neurologic: No focal deficits.   Lab Results  Component Value Date   CREATININE 1.16 03/19/2021   BUN 24 03/19/2021   NA 140 03/19/2021   K 4.8 03/19/2021   CL 98 03/19/2021   CO2 28 03/19/2021   Lab Results  Component Value Date   ALT 20 03/19/2021   AST 17 03/19/2021   ALKPHOS 56 03/19/2021   BILITOT 0.6 03/19/2021   Lab Results  Component Value Date   HGBA1C 5.5 03/19/2021   HGBA1C 5.5 09/18/2020   HGBA1C 5.6 04/08/2020   HGBA1C 6.0 (H) 12/20/2019   Lab Results  Component Value Date   INSULIN 15.1 03/19/2021   INSULIN 17.4 09/18/2020   INSULIN 17.2 04/08/2020   INSULIN 29.5 (H) 12/20/2019   Lab Results  Component Value Date   TSH 2.450 12/20/2019   Lab Results  Component Value Date   CHOL 90 (L) 03/19/2021   HDL 48 03/19/2021   LDLCALC 28 03/19/2021   TRIG 61 03/19/2021   Lab Results  Component Value Date   VD25OH 51.2 03/19/2021   VD25OH 91.9 09/18/2020   VD25OH 72.8 04/08/2020   Lab Results  Component Value Date   WBC 6.0 12/20/2019   HGB 16.6 12/20/2019   HCT 49.4 12/20/2019   MCV 96 12/20/2019   PLT 209 12/20/2019   No results found for: "IRON", "TIBC", "FERRITIN"  Attestation Statements:   Reviewed by clinician on day of visit: allergies, medications, problem list, medical  history, surgical history, family history, social history, and previous encounter notes.   I, Trixie Dredge, am acting as transcriptionist for [name], MD.  I have reviewed the above documentation for accuracy and completeness, and I agree with the above. -  [name], MD

## 2021-10-26 NOTE — Progress Notes (Unsigned)
TeleHealth Visit:  This visit was completed with telemedicine (audio/video) technology. Stephen Oconnell has verbally consented to this TeleHealth visit. The patient is located at home, the provider is located at home. The participants in this visit include the listed provider and patient. The visit was conducted today via MyChart video.  OBESITY Stephen Oconnell is here to discuss his progress with his obesity treatment plan along with follow-up of his obesity related diagnoses.   Today's visit was # 34 Starting weight: 248 lbs Starting date: 12/20/2019 Weight at last in office visit: 230 lbs on 10/13/21 Total weight loss: 18 lbs at last in office visit on 10/13/21. Today's reported weight: 232 lbs   Nutrition Plan: the Category 3 Plan- 75% adherence Hunger is well controlled. Cravings are moderately controlled.  Current exercise: walking for 45 minutes 3-4 times per week.  Interim History: Stephen Oconnell is doing very well with his meals but tends to eat greater than 300 snack calories per day-usually simple carbs like veggie straws.  He says he is up a few pounds. He is consistent with his walking but has not incorporated resistance training.   He reports water intake is adequate.  Typical day of food intake: Breakfast 3 eggs and 2 pieces of 45-calorie toast Lunch-sandwich with 4 ounces of meat, yogurt, fruit Dinner-8 to 10 ounces of protein and vegetables.  Assessment/Plan:  1. Prediabetes Last A1c was 5.5.  A1c was 6 in October 2021 when he started our program. Medication(s): Metformin 500 mg twice daily with meals.  Denies nausea, diarrhea. Lab Results  Component Value Date   HGBA1C 5.5 03/19/2021   Lab Results  Component Value Date   INSULIN 15.1 03/19/2021   INSULIN 17.4 09/18/2020   INSULIN 17.2 04/08/2020   INSULIN 29.5 (H) 12/20/2019   Plan: Continue metformin 500 mg twice daily with meals. Work on reducing simple carbohydrates.  2. Obesity: Current BMI 34.2 Stephen Oconnell is currently in the  action stage of change. As such, his goal is to continue with weight loss efforts.  He has agreed to the Category 3 Plan.   Measure out snacks like veggie straws into the portion size listed on the package.  Exercise goals: Continue walking but increase in intensity by walking more briskly.  Add in weights or yoga twice weekly.  Encouraged him to check out yoga videos on [employer] prime.  Behavioral modification strategies: increasing lean protein intake, decreasing simple carbohydrates, and planning for success.  Stephen Oconnell has agreed to follow-up with our clinic in 2 weeks.   No orders of the defined types were placed in this encounter.   There are no discontinued medications.   No orders of the defined types were placed in this encounter.     Objective:   VITALS: Per patient if applicable, see vitals. GENERAL: Alert and in no acute distress. CARDIOPULMONARY: No increased WOB. Speaking in clear sentences.  PSYCH: Pleasant and cooperative. Speech normal rate and rhythm. Affect is appropriate. Insight and judgement are appropriate. Attention is focused, linear, and appropriate.  NEURO: Oriented as arrived to appointment on time with no prompting.   Lab Results  Component Value Date   CREATININE 1.16 03/19/2021   BUN 24 03/19/2021   NA 140 03/19/2021   K 4.8 03/19/2021   CL 98 03/19/2021   CO2 28 03/19/2021   Lab Results  Component Value Date   ALT 20 03/19/2021   AST 17 03/19/2021   ALKPHOS 56 03/19/2021   BILITOT 0.6 03/19/2021   Lab Results  Component Value  Date   HGBA1C 5.5 03/19/2021   HGBA1C 5.5 09/18/2020   HGBA1C 5.6 04/08/2020   HGBA1C 6.0 (H) 12/20/2019   Lab Results  Component Value Date   INSULIN 15.1 03/19/2021   INSULIN 17.4 09/18/2020   INSULIN 17.2 04/08/2020   INSULIN 29.5 (H) 12/20/2019   Lab Results  Component Value Date   TSH 2.450 12/20/2019   Lab Results  Component Value Date   CHOL 90 (L) 03/19/2021   HDL 48 03/19/2021   LDLCALC 28  03/19/2021   TRIG 61 03/19/2021   Lab Results  Component Value Date   WBC 6.0 12/20/2019   HGB 16.6 12/20/2019   HCT 49.4 12/20/2019   MCV 96 12/20/2019   PLT 209 12/20/2019   No results found for: "IRON", "TIBC", "FERRITIN" Lab Results  Component Value Date   VD25OH 51.2 03/19/2021   VD25OH 91.9 09/18/2020   VD25OH 72.8 04/08/2020    Attestation Statements:   Reviewed by clinician on day of visit: allergies, medications, problem list, medical history, surgical history, family history, social history, and previous encounter notes.

## 2021-10-27 ENCOUNTER — Telehealth (INDEPENDENT_AMBULATORY_CARE_PROVIDER_SITE_OTHER): Payer: Medicare Other | Admitting: Family Medicine

## 2021-10-27 ENCOUNTER — Encounter (INDEPENDENT_AMBULATORY_CARE_PROVIDER_SITE_OTHER): Payer: Self-pay | Admitting: Family Medicine

## 2021-10-27 DIAGNOSIS — R7303 Prediabetes: Secondary | ICD-10-CM

## 2021-10-27 DIAGNOSIS — E669 Obesity, unspecified: Secondary | ICD-10-CM | POA: Diagnosis not present

## 2021-10-27 DIAGNOSIS — Z6834 Body mass index (BMI) 34.0-34.9, adult: Secondary | ICD-10-CM

## 2021-10-27 DIAGNOSIS — Z7984 Long term (current) use of oral hypoglycemic drugs: Secondary | ICD-10-CM | POA: Diagnosis not present

## 2021-11-10 ENCOUNTER — Encounter (INDEPENDENT_AMBULATORY_CARE_PROVIDER_SITE_OTHER): Payer: Self-pay | Admitting: Family Medicine

## 2021-11-10 ENCOUNTER — Ambulatory Visit (INDEPENDENT_AMBULATORY_CARE_PROVIDER_SITE_OTHER): Payer: Medicare Other | Admitting: Family Medicine

## 2021-11-10 VITALS — BP 126/63 | HR 65 | Temp 97.4°F | Ht 69.0 in | Wt 228.0 lb

## 2021-11-10 DIAGNOSIS — R7303 Prediabetes: Secondary | ICD-10-CM | POA: Diagnosis not present

## 2021-11-10 DIAGNOSIS — E669 Obesity, unspecified: Secondary | ICD-10-CM | POA: Diagnosis not present

## 2021-11-10 DIAGNOSIS — Z6833 Body mass index (BMI) 33.0-33.9, adult: Secondary | ICD-10-CM | POA: Diagnosis not present

## 2021-11-10 DIAGNOSIS — I10 Essential (primary) hypertension: Secondary | ICD-10-CM | POA: Diagnosis not present

## 2021-11-10 MED ORDER — METFORMIN HCL 500 MG PO TABS: 500.0000 mg | ORAL_TABLET | Freq: Two times a day (BID) | ORAL | 0 refills | Status: DC

## 2021-11-18 NOTE — Progress Notes (Signed)
Chief Complaint:   OBESITY Stephen Oconnell is here to discuss his progress with his obesity treatment plan along with follow-up of his obesity related diagnoses. Stephen Oconnell is on the Category 3 Plan and states he is following his eating plan approximately 75-80% of the time. Stephen Oconnell states he is walking for 60 minutes 3-4 times per week.  Today's visit was #: 67 Starting weight: 248 lbs Starting date: 12/20/2019 Today's weight: 228 lbs Today's date: 11/10/2021 Total lbs lost to date: 20 Total lbs lost since last in-office visit: 4  Interim History: Stephen Oconnell continues to do well with weight loss.  He has fluctuated in his weight this summer.  His wife helps with meal planning, but he is snacking more in the p.m.  He tried some chair yoga videos but found them to "new age" and weird.  Subjective:   1. Pre-diabetes Stephen Oconnell is stable on metformin, and he denies GI upset or hypoglycemia.   2. Essential hypertension Stephen Oconnell's blood pressure is well controlled and he is doing  better with hydration. He denies feeling lightheaded or dizzy.   Assessment/Plan:   1. Pre-diabetes We will refill metformin for 1 month, and Stephen Oconnell is to try to remember his second dose.  - metFORMIN (GLUCOPHAGE) 500 MG tablet; Take 1 tablet (500 mg total) by mouth 2 (two) times daily with a meal.  Dispense: 30 tablet; Refill: 0  2. Essential hypertension Stephen Oconnell will continue as is and will continue to monitor.   3. Obesity, Current BMI 33.7 Stephen Oconnell is currently in the action stage of change. As such, his goal is to continue with weight loss efforts. He has agreed to the Category 3 Plan.   Exercise goals: As is.   Behavioral modification strategies: increasing lean protein intake.  Stephen Oconnell has agreed to follow-up with our clinic in 2 weeks with Stephen [facility], FNP-C, and in 4 weeks with myself. He was informed of the importance of frequent follow-up visits to maximize his success with intensive lifestyle modifications for his multiple health  conditions.   Objective:   Blood pressure 126/63, pulse 65, temperature (!) 97.4 F (36.3 C), height '5\' 9"'$  (1.753 m), weight 228 lb (103.4 kg), SpO2 97 %. Body mass index is 33.67 kg/m.  General: Cooperative, alert, well developed, in no acute distress. HEENT: Conjunctivae and lids unremarkable. Cardiovascular: Regular rhythm.  Lungs: Normal work of breathing. Neurologic: No focal deficits.   Lab Results  Component Value Date   CREATININE 1.16 03/19/2021   BUN 24 03/19/2021   NA 140 03/19/2021   K 4.8 03/19/2021   CL 98 03/19/2021   CO2 28 03/19/2021   Lab Results  Component Value Date   ALT 20 03/19/2021   AST 17 03/19/2021   ALKPHOS 56 03/19/2021   BILITOT 0.6 03/19/2021   Lab Results  Component Value Date   HGBA1C 5.5 03/19/2021   HGBA1C 5.5 09/18/2020   HGBA1C 5.6 04/08/2020   HGBA1C 6.0 (H) 12/20/2019   Lab Results  Component Value Date   INSULIN 15.1 03/19/2021   INSULIN 17.4 09/18/2020   INSULIN 17.2 04/08/2020   INSULIN 29.5 (H) 12/20/2019   Lab Results  Component Value Date   TSH 2.450 12/20/2019   Lab Results  Component Value Date   CHOL 90 (L) 03/19/2021   HDL 48 03/19/2021   LDLCALC 28 03/19/2021   TRIG 61 03/19/2021   Lab Results  Component Value Date   VD25OH 51.2 03/19/2021   VD25OH 91.9 09/18/2020   VD25OH 72.8  04/08/2020   Lab Results  Component Value Date   WBC 6.0 12/20/2019   HGB 16.6 12/20/2019   HCT 49.4 12/20/2019   MCV 96 12/20/2019   PLT 209 12/20/2019   No results found for: "IRON", "TIBC", "FERRITIN"  Obesity Behavioral Intervention:   Approximately 15 minutes were spent on the discussion below.  ASK: We discussed the diagnosis of obesity with Stephen Oconnell today and Stephen Oconnell agreed to give [name] permission to discuss obesity behavioral modification therapy today.  ASSESS: Stephen Oconnell has the diagnosis of obesity and his BMI today is 33.7. Stephen Oconnell is in the action stage of change.   ADVISE: Stephen Oconnell was educated on the multiple health  risks of obesity as well as the benefit of weight loss to improve his health. He was advised of the need for long term treatment and the importance of lifestyle modifications to improve his current health and to decrease his risk of future health problems.  AGREE: Multiple dietary modification options and treatment options were discussed and Stephen Oconnell agreed to follow the recommendations documented in the above note.  ARRANGE: Stephen Oconnell was educated on the importance of frequent visits to treat obesity as outlined per CMS and USPSTF guidelines and agreed to schedule his next follow up appointment today.  Attestation Statements:   Reviewed by clinician on day of visit: allergies, medications, problem list, medical history, surgical history, family history, social history, and previous encounter notes.   I, Trixie Dredge, am acting as transcriptionist for [name], MD.  I have reviewed the above documentation for accuracy and completeness, and I agree with the above. -  [name], MD

## 2021-11-24 ENCOUNTER — Telehealth (INDEPENDENT_AMBULATORY_CARE_PROVIDER_SITE_OTHER): Payer: Medicare Other | Admitting: Family Medicine

## 2021-11-24 NOTE — Progress Notes (Signed)
TeleHealth Visit:  This visit was completed with telemedicine (audio/video) technology. Stephen Oconnell has verbally consented to this TeleHealth visit. The patient is located at home, the provider is located at home. The participants in this visit include the listed provider and patient. The visit was conducted today via MyChart video.  OBESITY Stephen Oconnell is here to discuss his progress with his obesity treatment plan along with follow-up of his obesity related diagnoses.   Today's visit was # 36 Starting weight: 248 lbs Starting date: 12/20/2019 Weight at last in office visit: 228 lbs on 11/10/21 Total weight loss: 20 lbs at last in office visit on 11/10/21. Today's reported weight: *** lbs No weight reported.  Nutrition Plan: the Category 3 Plan.  Hunger is {EWCONTROLASSESSMENT:24261}. Cravings are {EWCONTROLASSESSMENT:24261}.  Current exercise: {exercise types:16438}  Interim History: ***  Assessment/Plan:  1. ***  2. ***  3. ***  Obesity: Current BMI *** Stephen Oconnell {CHL AMB IS/IS NOT:210130109} currently in the action stage of change. As such, his goal is to {MWMwtloss#1:210800005}.  He has agreed to {MWMwtlossportion/plan2:23431}.   Exercise goals: {MWM EXERCISE RECS:23473}  Behavioral modification strategies: {MWMwtlossdietstrategies3:23432}.  Stephen Oconnell has agreed to follow-up with our clinic in {NUMBER 1-10:22536} weeks.   No orders of the defined types were placed in this encounter.   There are no discontinued medications.   No orders of the defined types were placed in this encounter.     Objective:   VITALS: Per patient if applicable, see vitals. GENERAL: Alert and in no acute distress. CARDIOPULMONARY: No increased WOB. Speaking in clear sentences.  PSYCH: Pleasant and cooperative. Speech normal rate and rhythm. Affect is appropriate. Insight and judgement are appropriate. Attention is focused, linear, and appropriate.  NEURO: Oriented as arrived to appointment on time with  no prompting.   Lab Results  Component Value Date   CREATININE 1.16 03/19/2021   BUN 24 03/19/2021   NA 140 03/19/2021   K 4.8 03/19/2021   CL 98 03/19/2021   CO2 28 03/19/2021   Lab Results  Component Value Date   ALT 20 03/19/2021   AST 17 03/19/2021   ALKPHOS 56 03/19/2021   BILITOT 0.6 03/19/2021   Lab Results  Component Value Date   HGBA1C 5.5 03/19/2021   HGBA1C 5.5 09/18/2020   HGBA1C 5.6 04/08/2020   HGBA1C 6.0 (H) 12/20/2019   Lab Results  Component Value Date   INSULIN 15.1 03/19/2021   INSULIN 17.4 09/18/2020   INSULIN 17.2 04/08/2020   INSULIN 29.5 (H) 12/20/2019   Lab Results  Component Value Date   TSH 2.450 12/20/2019   Lab Results  Component Value Date   CHOL 90 (L) 03/19/2021   HDL 48 03/19/2021   LDLCALC 28 03/19/2021   TRIG 61 03/19/2021   Lab Results  Component Value Date   WBC 6.0 12/20/2019   HGB 16.6 12/20/2019   HCT 49.4 12/20/2019   MCV 96 12/20/2019   PLT 209 12/20/2019   No results found for: "IRON", "TIBC", "FERRITIN" Lab Results  Component Value Date   VD25OH 51.2 03/19/2021   VD25OH 91.9 09/18/2020   VD25OH 72.8 04/08/2020    Attestation Statements:   Reviewed by clinician on day of visit: allergies, medications, problem list, medical history, surgical history, family history, social history, and previous encounter notes.  ***(delete if time-based billing not used) Time spent on visit including the items listed below was *** minutes.  -preparing to see the patient (e.g., review of tests, history, previous notes) -obtaining and/or reviewing separately obtained  history -counseling and educating the patient/family/caregiver -documenting clinical information in the electronic or other health record -ordering medications, tests, or procedures -independently interpreting results and communicating results to the patient/ family/caregiver -referring and communicating with other health care professionals  -care coordination

## 2021-11-25 ENCOUNTER — Encounter (INDEPENDENT_AMBULATORY_CARE_PROVIDER_SITE_OTHER): Payer: Self-pay | Admitting: Family Medicine

## 2021-11-25 ENCOUNTER — Telehealth (INDEPENDENT_AMBULATORY_CARE_PROVIDER_SITE_OTHER): Payer: Self-pay | Admitting: *Deleted

## 2021-11-25 ENCOUNTER — Telehealth (INDEPENDENT_AMBULATORY_CARE_PROVIDER_SITE_OTHER): Payer: Medicare Other | Admitting: Family Medicine

## 2021-11-25 DIAGNOSIS — E669 Obesity, unspecified: Secondary | ICD-10-CM | POA: Diagnosis not present

## 2021-11-25 DIAGNOSIS — R7303 Prediabetes: Secondary | ICD-10-CM

## 2021-11-25 DIAGNOSIS — Z6833 Body mass index (BMI) 33.0-33.9, adult: Secondary | ICD-10-CM

## 2021-11-25 NOTE — Progress Notes (Unsigned)
Error

## 2021-11-30 NOTE — Progress Notes (Unsigned)
error 

## 2021-12-08 ENCOUNTER — Encounter (INDEPENDENT_AMBULATORY_CARE_PROVIDER_SITE_OTHER): Payer: Self-pay | Admitting: Family Medicine

## 2021-12-08 ENCOUNTER — Ambulatory Visit (INDEPENDENT_AMBULATORY_CARE_PROVIDER_SITE_OTHER): Payer: Medicare Other | Admitting: Family Medicine

## 2021-12-08 VITALS — BP 134/61 | HR 66 | Temp 97.8°F | Ht 69.0 in | Wt 230.0 lb

## 2021-12-08 DIAGNOSIS — R7303 Prediabetes: Secondary | ICD-10-CM | POA: Diagnosis not present

## 2021-12-08 DIAGNOSIS — Z6834 Body mass index (BMI) 34.0-34.9, adult: Secondary | ICD-10-CM | POA: Diagnosis not present

## 2021-12-08 DIAGNOSIS — E669 Obesity, unspecified: Secondary | ICD-10-CM

## 2021-12-15 NOTE — Progress Notes (Signed)
Chief Complaint:   OBESITY Stephen Oconnell is here to discuss his progress with his obesity treatment plan along with follow-up of his obesity related diagnoses. Stephen Oconnell is on the Category 3 Plan and states he is following his eating plan approximately 80% of the time. Stephen Oconnell states he is walking for 60 minutes 3-4 times per week.  Today's visit was #: 27 Starting weight: 248 lbs Starting date: 12/20/2019 Today's weight: 230 lbs Today's date: 12/08/2021 Total lbs lost to date: 18 Total lbs lost since last in-office visit: 0  Interim History: Stephen Oconnell has done well with weight loss overall, but he notes some celebration eating lately with birthdays.  He is doing well with meeting protein needs and he denies hunger, but he notes some cravings.  We discussed watching hidden calories and simple carbohydrates.  Subjective:   1. Pre-diabetes Stephen Oconnell is taking metformin 500 mg twice daily with no side effects noted.  His last A1c was 5.5, at goal.  Assessment/Plan:   1. Pre-diabetes Stephen Oconnell will continue metformin, diet, exercise, and decreasing simple carbohydrates to help decrease the risk of diabetes.   2. Obesity, Current BMI 34.0 Stephen Oconnell is currently in the action stage of change. As such, his goal is to continue with weight loss efforts. He has agreed to the Category 3 Plan.   We discussed working on strengthening and adding to walking with weight vest.  Start slow and gradually increase.  Exercise goals: Add weight vest versus consider strengthening exercise in the gym.  Behavioral modification strategies: increasing lean protein intake, decreasing simple carbohydrates, meal planning and cooking strategies, and better snacking choices.  Stephen Oconnell has agreed to follow-up with our clinic in 3 to 4 weeks. He was informed of the importance of frequent follow-up visits to maximize his success with intensive lifestyle modifications for his multiple health conditions.   Objective:   Blood pressure 134/61, pulse  66, temperature 97.8 F (36.6 C), height '5\' 9"'$  (1.753 m), weight 230 lb (104.3 kg), SpO2 97 %. Body mass index is 33.97 kg/m.  General: Cooperative, alert, well developed, in no acute distress. HEENT: Conjunctivae and lids unremarkable. Cardiovascular: Regular rhythm.  Lungs: Normal work of breathing. Neurologic: No focal deficits.   Lab Results  Component Value Date   CREATININE 1.16 03/19/2021   BUN 24 03/19/2021   NA 140 03/19/2021   K 4.8 03/19/2021   CL 98 03/19/2021   CO2 28 03/19/2021   Lab Results  Component Value Date   ALT 20 03/19/2021   AST 17 03/19/2021   ALKPHOS 56 03/19/2021   BILITOT 0.6 03/19/2021   Lab Results  Component Value Date   HGBA1C 5.5 03/19/2021   HGBA1C 5.5 09/18/2020   HGBA1C 5.6 04/08/2020   HGBA1C 6.0 (H) 12/20/2019   Lab Results  Component Value Date   INSULIN 15.1 03/19/2021   INSULIN 17.4 09/18/2020   INSULIN 17.2 04/08/2020   INSULIN 29.5 (H) 12/20/2019   Lab Results  Component Value Date   TSH 2.450 12/20/2019   Lab Results  Component Value Date   CHOL 90 (L) 03/19/2021   HDL 48 03/19/2021   LDLCALC 28 03/19/2021   TRIG 61 03/19/2021   Lab Results  Component Value Date   VD25OH 51.2 03/19/2021   VD25OH 91.9 09/18/2020   VD25OH 72.8 04/08/2020   Lab Results  Component Value Date   WBC 6.0 12/20/2019   HGB 16.6 12/20/2019   HCT 49.4 12/20/2019   MCV 96 12/20/2019   PLT 209  12/20/2019   No results found for: "IRON", "TIBC", "FERRITIN"  Attestation Statements:   Reviewed by clinician on day of visit: allergies, medications, problem list, medical history, surgical history, family history, social history, and previous encounter notes.  Time spent on visit including pre-visit chart review and post-visit care and charting was 30 minutes.   I, Trixie Dredge, am acting as transcriptionist for [name], MD.  I have reviewed the above documentation for accuracy and completeness, and I agree with the above. -   [name], MD

## 2021-12-21 NOTE — Progress Notes (Unsigned)
TeleHealth Visit:  This visit was completed with telemedicine (audio/video) technology. Takari has verbally consented to this TeleHealth visit. The patient is located at home, the provider is located at home. The participants in this visit include the listed provider and patient. The visit was conducted today via MyChart video.  OBESITY Stephen Oconnell is here to discuss his progress with his obesity treatment plan along with follow-up of his obesity related diagnoses.   Today's visit was # 75 Starting weight: 248 lbs Starting date: 12/20/2019 Weight at last in office visit: 230 lbs on 12/08/21 Total weight loss: 18 lbs at last in office visit on 12/08/21. Today's reported weight: No weight reported.  Nutrition Plan: the Category 3 Plan -60%  Current exercise:  walking for 60 minutes 3-4 times per week.   Interim History: He is still considering buying a weight vest for his walking.  Meals are mostly on plan but reports having quite a few celebrations over the past few weeks.  Only on plan about 60%.  Has cravings for sweets and cookies which is difficult because his wife bakes as a hobby. Protein and fluid intake is good.  He plans to try to be more diligent with plan adherence the next few weeks.  Assessment/Plan:  1. Prediabetes A1c currently 5.5 (03/19/2021).  A1c was 6.0 on 12/20/2019. Medication(s): Metformin 500 mg twice daily.  Denies side effects. Denies polyphagia. Lab Results  Component Value Date   HGBA1C 5.5 03/19/2021   Lab Results  Component Value Date   INSULIN 15.1 03/19/2021   INSULIN 17.4 09/18/2020   INSULIN 17.2 04/08/2020   INSULIN 29.5 (H) 12/20/2019    Plan: Refill metformin 500 mg twice daily.  2. Hypertension Hypertension well controlled.  Blood pressures have been at goal last several office visits.  He does not check pressure at home. Medication(s): Losartan 50 mg, triamterene-HCTZ 37.5-25 mg daily.  BP Readings from Last 3 Encounters:   12/08/21 134/61  11/25/21 134/74  11/10/21 126/63   Lab Results  Component Value Date   CREATININE 1.16 03/19/2021   CREATININE 1.01 09/18/2020   CREATININE 1.26 04/08/2020    Plan: Continue antihypertensives at current dosages.   3. Eating disorder/emotional eating Struggles with sweets cravings. Tends to exceed extra calorie allotment. Medication(s): Lexapro 10 mg daily.  Plan: Continue Lexapro 10 mg daily. Discussed options of bupropion and visits with Dr. Mallie Mussel. He will consider both of these options.  He is hesitant to add another medication. 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 Would have him check with his optometrist/ophthalmologist before starting bupropion.  Obesity: Current BMI 34 [name] is currently in the action stage of change. As such, his goal is to continue with weight loss efforts.  He has agreed to the Category 3 Plan.   Exercise goals: Continue current walking regimen.  He may add a weight vest.  Encouraged him to do some resistance training for his arms.  Behavioral modification strategies: decreasing simple carbohydrates, emotional eating strategies, avoiding temptations, and planning for success.  Thedore has agreed to follow-up with our clinic in 2 weeks.   No orders of the defined types were placed in this encounter.   Medications Discontinued During This Encounter  Medication Reason   metFORMIN (GLUCOPHAGE) 500 MG tablet Reorder     Meds ordered this encounter  Medications   metFORMIN (GLUCOPHAGE) 500 MG tablet    Sig: Take 1 tablet (500 mg total) by mouth 2 (two) times daily with a  meal.    Dispense:  30 tablet    Refill:  0    Order Specific Question:   Supervising Provider    Answer:   Dell Ponto [[id number]]      Objective:   VITALS: Per patient if applicable, see vitals. GENERAL: Alert and in no acute distress. CARDIOPULMONARY: No increased WOB. Speaking in clear sentences.  PSYCH: Pleasant and  cooperative. Speech normal rate and rhythm. Affect is appropriate. Insight and judgement are appropriate. Attention is focused, linear, and appropriate.  NEURO: Oriented as arrived to appointment on time with no prompting.   Lab Results  Component Value Date   CREATININE 1.16 03/19/2021   BUN 24 03/19/2021   NA 140 03/19/2021   K 4.8 03/19/2021   CL 98 03/19/2021   CO2 28 03/19/2021   Lab Results  Component Value Date   ALT 20 03/19/2021   AST 17 03/19/2021   ALKPHOS 56 03/19/2021   BILITOT 0.6 03/19/2021   Lab Results  Component Value Date   HGBA1C 5.5 03/19/2021   HGBA1C 5.5 09/18/2020   HGBA1C 5.6 04/08/2020   HGBA1C 6.0 (H) 12/20/2019   Lab Results  Component Value Date   INSULIN 15.1 03/19/2021   INSULIN 17.4 09/18/2020   INSULIN 17.2 04/08/2020   INSULIN 29.5 (H) 12/20/2019   Lab Results  Component Value Date   TSH 2.450 12/20/2019   Lab Results  Component Value Date   CHOL 90 (L) 03/19/2021   HDL 48 03/19/2021   LDLCALC 28 03/19/2021   TRIG 61 03/19/2021   Lab Results  Component Value Date   WBC 6.0 12/20/2019   HGB 16.6 12/20/2019   HCT 49.4 12/20/2019   MCV 96 12/20/2019   PLT 209 12/20/2019   No results found for: "IRON", "TIBC", "FERRITIN" Lab Results  Component Value Date   VD25OH 51.2 03/19/2021   VD25OH 91.9 09/18/2020   VD25OH 72.8 04/08/2020    Attestation Statements:   Reviewed by clinician on day of visit: allergies, medications, problem list, medical history, surgical history, family history, social history, and previous encounter notes.

## 2021-12-22 ENCOUNTER — Encounter (INDEPENDENT_AMBULATORY_CARE_PROVIDER_SITE_OTHER): Payer: Self-pay | Admitting: Family Medicine

## 2021-12-22 ENCOUNTER — Telehealth (INDEPENDENT_AMBULATORY_CARE_PROVIDER_SITE_OTHER): Payer: Medicare Other | Admitting: Family Medicine

## 2021-12-22 DIAGNOSIS — F5089 Other specified eating disorder: Secondary | ICD-10-CM | POA: Diagnosis not present

## 2021-12-22 DIAGNOSIS — R7303 Prediabetes: Secondary | ICD-10-CM | POA: Diagnosis not present

## 2021-12-22 DIAGNOSIS — E669 Obesity, unspecified: Secondary | ICD-10-CM

## 2021-12-22 DIAGNOSIS — E66812 Obesity, class 2: Secondary | ICD-10-CM

## 2021-12-22 DIAGNOSIS — I10 Essential (primary) hypertension: Secondary | ICD-10-CM

## 2021-12-22 DIAGNOSIS — Z6834 Body mass index (BMI) 34.0-34.9, adult: Secondary | ICD-10-CM

## 2021-12-22 MED ORDER — METFORMIN HCL 500 MG PO TABS: 500.0000 mg | ORAL_TABLET | Freq: Two times a day (BID) | ORAL | 0 refills | Status: DC

## 2022-01-05 ENCOUNTER — Ambulatory Visit (INDEPENDENT_AMBULATORY_CARE_PROVIDER_SITE_OTHER): Payer: Medicare Other | Admitting: Family Medicine

## 2022-01-05 ENCOUNTER — Encounter (INDEPENDENT_AMBULATORY_CARE_PROVIDER_SITE_OTHER): Payer: Self-pay | Admitting: Family Medicine

## 2022-01-05 VITALS — BP 132/56 | HR 72 | Temp 97.5°F | Ht 69.0 in | Wt 230.0 lb

## 2022-01-05 DIAGNOSIS — F5089 Other specified eating disorder: Secondary | ICD-10-CM | POA: Diagnosis not present

## 2022-01-05 DIAGNOSIS — Z6834 Body mass index (BMI) 34.0-34.9, adult: Secondary | ICD-10-CM

## 2022-01-05 DIAGNOSIS — R7303 Prediabetes: Secondary | ICD-10-CM | POA: Diagnosis not present

## 2022-01-05 DIAGNOSIS — E669 Obesity, unspecified: Secondary | ICD-10-CM

## 2022-01-05 MED ORDER — METFORMIN HCL 500 MG PO TABS: 500.0000 mg | ORAL_TABLET | Freq: Two times a day (BID) | ORAL | 0 refills | Status: DC

## 2022-01-05 MED ORDER — BUPROPION HCL ER (SR) 150 MG PO TB12: 150.0000 mg | ORAL_TABLET | Freq: Every day | ORAL | 0 refills | Status: DC

## 2022-01-13 NOTE — Progress Notes (Signed)
Chief Complaint:   OBESITY Stephen Oconnell is here to discuss his progress with his obesity treatment plan along with follow-up of his obesity related diagnoses. Stephen Oconnell is on the Category 3 Plan and states he is following his eating plan approximately 80% of the time. Stephen Oconnell states he is walking for 60 minutes 4 times per week.  Today's visit was #: 54 Starting weight: 248 lbs Starting date: 12/20/2019 Today's weight: 230 lbs Today's date: 01/05/2022 Total lbs lost to date: 18 Total lbs lost since last in-office visit: 0  Interim History: Stephen Oconnell did well with maintaining his weight loss.  His hunger is well controlled, but he notes cravings for some simple carbohydrates.  Subjective:   1. Pre-diabetes Stephen Oconnell's last A1c was 5.5.  He is taking metformin 500 mg twice daily with no side effects noted.  2. Other disorder of eating/emotional eating Stephen Oconnell is getting Lexapro 10 mg daily with no side effects noted.  He notes some cravings for simple carbohydrates.  We discussed starting bupropion, and risks and benefits.  Patient is agreeable to try.  Assessment/Plan:   1. Pre-diabetes Stephen Oconnell will continue metformin 500 mg twice daily, and we will refill for 90 days.  He will continue with his healthy eating plan and exercise.  - metFORMIN (GLUCOPHAGE) 500 MG tablet; Take 1 tablet (500 mg total) by mouth 2 (two) times daily with a meal.  Dispense: 180 tablet; Refill: 0  2. Other disorder of eating/emotional eating Stephen Oconnell continue Lexapro, and he agreed to start Wellbutrin SR 150 mg every morning, with no refills.  - buPROPion (WELLBUTRIN SR) 150 MG 12 hr tablet; Take 1 tablet (150 mg total) by mouth daily.  Dispense: 30 tablet; Refill: 0  3. Obesity, Current BMI 34.0 Stephen Oconnell is currently in the action stage of change. As such, his goal is to continue with weight loss efforts. He has agreed to the Category 3 Plan.   Exercise goals: As is.   Behavioral modification strategies: increasing lean protein  intake, decreasing simple carbohydrates, increasing water intake, and emotional eating strategies.  Stephen Oconnell has agreed to follow-up with our clinic in 4 weeks. He was informed of the importance of frequent follow-up visits to maximize his success with intensive lifestyle modifications for his multiple health conditions.   Objective:   Blood pressure (!) 132/56, pulse 72, temperature (!) 97.5 F (36.4 C), height '5\' 9"'$  (1.753 m), weight 230 lb (104.3 kg), SpO2 98 %. Body mass index is 33.97 kg/m.  General: Cooperative, alert, well developed, in no acute distress. HEENT: Conjunctivae and lids unremarkable. Cardiovascular: Regular rhythm.  Lungs: Normal work of breathing. Neurologic: No focal deficits.   Lab Results  Component Value Date   CREATININE 1.16 03/19/2021   BUN 24 03/19/2021   NA 140 03/19/2021   K 4.8 03/19/2021   CL 98 03/19/2021   CO2 28 03/19/2021   Lab Results  Component Value Date   ALT 20 03/19/2021   AST 17 03/19/2021   ALKPHOS 56 03/19/2021   BILITOT 0.6 03/19/2021   Lab Results  Component Value Date   HGBA1C 5.5 03/19/2021   HGBA1C 5.5 09/18/2020   HGBA1C 5.6 04/08/2020   HGBA1C 6.0 (H) 12/20/2019   Lab Results  Component Value Date   INSULIN 15.1 03/19/2021   INSULIN 17.4 09/18/2020   INSULIN 17.2 04/08/2020   INSULIN 29.5 (H) 12/20/2019   Lab Results  Component Value Date   TSH 2.450 12/20/2019   Lab Results  Component Value Date  CHOL 90 (L) 03/19/2021   HDL 48 03/19/2021   LDLCALC 28 03/19/2021   TRIG 61 03/19/2021   Lab Results  Component Value Date   VD25OH 51.2 03/19/2021   VD25OH 91.9 09/18/2020   VD25OH 72.8 04/08/2020   Lab Results  Component Value Date   WBC 6.0 12/20/2019   HGB 16.6 12/20/2019   HCT 49.4 12/20/2019   MCV 96 12/20/2019   PLT 209 12/20/2019   No results found for: "IRON", "TIBC", "FERRITIN"  Attestation Statements:   Reviewed by clinician on day of visit: allergies, medications, problem list, medical  history, surgical history, family history, social history, and previous encounter notes.   I, Trixie Dredge, am acting as transcriptionist for [name], MD.  I have reviewed the above documentation for accuracy and completeness, and I agree with the above. -  [name], MD

## 2022-01-18 NOTE — Progress Notes (Signed)
TeleHealth Visit:  This visit was completed with telemedicine (audio/video) technology. Stephen Oconnell has verbally consented to this TeleHealth visit. The patient is located at home, the provider is located at home. The participants in this visit include the listed provider and patient. The visit was conducted today via MyChart video.  OBESITY Stephen Oconnell is here to discuss his progress with his obesity treatment plan along with follow-up of his obesity related diagnoses.   Today's visit was # 40 Starting weight: 248 lbs Starting date: 12/20/2019 Weight at last in office visit: 230 lbs on 01/05/22 Total weight loss: 18 lbs at last in office visit on 01/05/22. Today's reported weight: *** lbs No weight reported.  Nutrition Plan: the Category 3 Plan.   Current exercise: {exercise types:16438} walking for 60 minutes 4 times per week.  Interim History: ***  Assessment/Plan:  1. ***  2. ***  3. ***  Obesity: Current BMI *** Stephen Oconnell {CHL AMB IS/IS NOT:210130109} currently in the action stage of change. As such, his goal is to {MWMwtloss#1:210800005}.  He has agreed to {MWMwtlossportion/plan2:23431}.   Exercise goals: {MWM EXERCISE RECS:23473}  Behavioral modification strategies: {MWMwtlossdietstrategies3:23432}.  Stephen Oconnell has agreed to follow-up with our clinic in {NUMBER 1-10:22536} weeks.   No orders of the defined types were placed in this encounter.   There are no discontinued medications.   No orders of the defined types were placed in this encounter.     Objective:   VITALS: Per patient if applicable, see vitals. GENERAL: Alert and in no acute distress. CARDIOPULMONARY: No increased WOB. Speaking in clear sentences.  PSYCH: Pleasant and cooperative. Speech normal rate and rhythm. Affect is appropriate. Insight and judgement are appropriate. Attention is focused, linear, and appropriate.  NEURO: Oriented as arrived to appointment on time with no prompting.   Lab Results   Component Value Date   CREATININE 1.16 03/19/2021   BUN 24 03/19/2021   NA 140 03/19/2021   K 4.8 03/19/2021   CL 98 03/19/2021   CO2 28 03/19/2021   Lab Results  Component Value Date   ALT 20 03/19/2021   AST 17 03/19/2021   ALKPHOS 56 03/19/2021   BILITOT 0.6 03/19/2021   Lab Results  Component Value Date   HGBA1C 5.5 03/19/2021   HGBA1C 5.5 09/18/2020   HGBA1C 5.6 04/08/2020   HGBA1C 6.0 (H) 12/20/2019   Lab Results  Component Value Date   INSULIN 15.1 03/19/2021   INSULIN 17.4 09/18/2020   INSULIN 17.2 04/08/2020   INSULIN 29.5 (H) 12/20/2019   Lab Results  Component Value Date   TSH 2.450 12/20/2019   Lab Results  Component Value Date   CHOL 90 (L) 03/19/2021   HDL 48 03/19/2021   LDLCALC 28 03/19/2021   TRIG 61 03/19/2021   Lab Results  Component Value Date   WBC 6.0 12/20/2019   HGB 16.6 12/20/2019   HCT 49.4 12/20/2019   MCV 96 12/20/2019   PLT 209 12/20/2019   No results found for: "IRON", "TIBC", "FERRITIN" Lab Results  Component Value Date   VD25OH 51.2 03/19/2021   VD25OH 91.9 09/18/2020   VD25OH 72.8 04/08/2020    Attestation Statements:   Reviewed by clinician on day of visit: allergies, medications, problem list, medical history, surgical history, family history, social history, and previous encounter notes.  ***(delete if time-based billing not used) Time spent on visit including the items listed below was *** minutes.  -preparing to see the patient (e.g., review of tests, history, previous notes) -obtaining and/or reviewing  separately obtained history -counseling and educating the patient/family/caregiver -documenting clinical information in the electronic or other health record -ordering medications, tests, or procedures -independently interpreting results and communicating results to the patient/ family/caregiver -referring and communicating with other health care professionals  -care coordination

## 2022-01-19 ENCOUNTER — Telehealth (INDEPENDENT_AMBULATORY_CARE_PROVIDER_SITE_OTHER): Payer: Medicare Other | Admitting: Family Medicine

## 2022-01-19 ENCOUNTER — Encounter (INDEPENDENT_AMBULATORY_CARE_PROVIDER_SITE_OTHER): Payer: Self-pay | Admitting: Family Medicine

## 2022-01-19 DIAGNOSIS — Z6833 Body mass index (BMI) 33.0-33.9, adult: Secondary | ICD-10-CM | POA: Diagnosis not present

## 2022-01-19 DIAGNOSIS — E669 Obesity, unspecified: Secondary | ICD-10-CM | POA: Diagnosis not present

## 2022-01-19 DIAGNOSIS — F5089 Other specified eating disorder: Secondary | ICD-10-CM

## 2022-02-09 ENCOUNTER — Encounter (INDEPENDENT_AMBULATORY_CARE_PROVIDER_SITE_OTHER): Payer: Self-pay | Admitting: Family Medicine

## 2022-02-09 ENCOUNTER — Ambulatory Visit (INDEPENDENT_AMBULATORY_CARE_PROVIDER_SITE_OTHER): Payer: Medicare Other | Admitting: Family Medicine

## 2022-02-09 VITALS — BP 132/68 | HR 65 | Temp 97.8°F | Ht 69.0 in | Wt 231.0 lb

## 2022-02-09 DIAGNOSIS — I10 Essential (primary) hypertension: Secondary | ICD-10-CM

## 2022-02-09 DIAGNOSIS — F509 Eating disorder, unspecified: Secondary | ICD-10-CM | POA: Insufficient documentation

## 2022-02-09 DIAGNOSIS — Z6834 Body mass index (BMI) 34.0-34.9, adult: Secondary | ICD-10-CM

## 2022-02-09 DIAGNOSIS — E6609 Other obesity due to excess calories: Secondary | ICD-10-CM | POA: Diagnosis not present

## 2022-02-09 DIAGNOSIS — F5089 Other specified eating disorder: Secondary | ICD-10-CM | POA: Diagnosis not present

## 2022-02-09 DIAGNOSIS — Z6836 Body mass index (BMI) 36.0-36.9, adult: Secondary | ICD-10-CM

## 2022-02-09 DIAGNOSIS — E66812 Obesity, class 2: Secondary | ICD-10-CM

## 2022-02-09 MED ORDER — BUPROPION HCL ER (SR) 150 MG PO TB12: 150.0000 mg | ORAL_TABLET | Freq: Every day | ORAL | 0 refills | Status: DC

## 2022-02-22 NOTE — Progress Notes (Signed)
Chief Complaint:   OBESITY Stephen Oconnell is here to discuss his progress with his obesity treatment plan along with follow-up of his obesity related diagnoses. Stephen Oconnell is on the Category 3 Plan and states he is following his eating plan approximately 60% of the time. Stephen Oconnell states he is walking for 50-60 minutes 3-5 times per week.  Today's visit was #: 62 Starting weight: 248 lbs Starting date: 12/20/2019 Today's weight: 231 lbs Today's date: 02/09/2022 Total lbs lost to date: 17 Total lbs lost since last in-office visit: 0  Interim History: Stephen Oconnell has done well with minimizing holiday weight gain.  He is open to discussing Christmas eating strategies.  Subjective:   1. Essential hypertension Stephen Oconnell's blood pressure is controlled on his medications.  He denies increase in blood pressure with starting Wellbutrin.  2. Other disorder of eating/emotional eating Stephen Oconnell notes increased energy relatively quickly while on Wellbutrin.  No headaches, palpitations, or insomnia were noted.  Assessment/Plan:   1. Essential hypertension Stephen Oconnell will continue with his diet, exercise, and medications, and we will continue to follow.  2. Other disorder of eating/emotional eating Stephen Oconnell will continue Wellbutrin SR 150 mg daily, and we will refill for 90 days.  - buPROPion (WELLBUTRIN SR) 150 MG 12 hr tablet; Take 1 tablet (150 mg total) by mouth daily.  Dispense: 90 tablet; Refill: 0  3. Obesity, Current BMI 34.1 Stephen Oconnell is currently in the action stage of change. As such, his goal is to continue with weight loss efforts. He has agreed to the Category 3 Plan.   Exercise goals: As is.   Behavioral modification strategies: increasing lean protein intake and holiday eating strategies .  Stephen Oconnell has agreed to follow-up with our clinic in 4 weeks. He was informed of the importance of frequent follow-up visits to maximize his success with intensive lifestyle modifications for his multiple health conditions.   Objective:    Blood pressure 132/68, pulse 65, temperature 97.8 F (36.6 C), height '5\' 9"'$  (1.753 m), weight 231 lb (104.8 kg), SpO2 98 %. Body mass index is 34.11 kg/m.  General: Cooperative, alert, well developed, in no acute distress. HEENT: Conjunctivae and lids unremarkable. Cardiovascular: Regular rhythm.  Lungs: Normal work of breathing. Neurologic: No focal deficits.   Lab Results  Component Value Date   CREATININE 1.16 03/19/2021   BUN 24 03/19/2021   NA 140 03/19/2021   K 4.8 03/19/2021   CL 98 03/19/2021   CO2 28 03/19/2021   Lab Results  Component Value Date   ALT 20 03/19/2021   AST 17 03/19/2021   ALKPHOS 56 03/19/2021   BILITOT 0.6 03/19/2021   Lab Results  Component Value Date   HGBA1C 5.5 03/19/2021   HGBA1C 5.5 09/18/2020   HGBA1C 5.6 04/08/2020   HGBA1C 6.0 (H) 12/20/2019   Lab Results  Component Value Date   INSULIN 15.1 03/19/2021   INSULIN 17.4 09/18/2020   INSULIN 17.2 04/08/2020   INSULIN 29.5 (H) 12/20/2019   Lab Results  Component Value Date   TSH 2.450 12/20/2019   Lab Results  Component Value Date   CHOL 90 (L) 03/19/2021   HDL 48 03/19/2021   LDLCALC 28 03/19/2021   TRIG 61 03/19/2021   Lab Results  Component Value Date   VD25OH 51.2 03/19/2021   VD25OH 91.9 09/18/2020   VD25OH 72.8 04/08/2020   Lab Results  Component Value Date   WBC 6.0 12/20/2019   HGB 16.6 12/20/2019   HCT 49.4 12/20/2019   MCV  96 12/20/2019   PLT 209 12/20/2019   No results found for: "IRON", "TIBC", "FERRITIN"  Attestation Statements:   Reviewed by clinician on day of visit: allergies, medications, problem list, medical history, surgical history, family history, social history, and previous encounter notes.   I, Trixie Dredge, am acting as transcriptionist for [name], MD.  I have reviewed the above documentation for accuracy and completeness, and I agree with the above. -  [name], MD

## 2022-03-09 ENCOUNTER — Ambulatory Visit (INDEPENDENT_AMBULATORY_CARE_PROVIDER_SITE_OTHER): Payer: Medicare Other | Admitting: Family Medicine

## 2022-03-18 ENCOUNTER — Ambulatory Visit: Payer: Medicare Other | Attending: Physician Assistant | Admitting: Physician Assistant

## 2022-03-18 ENCOUNTER — Encounter: Payer: Self-pay | Admitting: Physician Assistant

## 2022-03-18 VITALS — BP 138/72 | HR 63 | Ht 69.0 in | Wt 239.8 lb

## 2022-03-18 DIAGNOSIS — I2584 Coronary atherosclerosis due to calcified coronary lesion: Secondary | ICD-10-CM

## 2022-03-18 DIAGNOSIS — I4510 Unspecified right bundle-branch block: Secondary | ICD-10-CM

## 2022-03-18 DIAGNOSIS — I2510 Atherosclerotic heart disease of native coronary artery without angina pectoris: Secondary | ICD-10-CM | POA: Diagnosis not present

## 2022-03-18 DIAGNOSIS — I10 Essential (primary) hypertension: Secondary | ICD-10-CM

## 2022-03-18 NOTE — Patient Instructions (Addendum)
Medication Instructions:   Your physician recommends that you continue on your current medications as directed. Please refer to the Current Medication list given to you today.  *If you need a refill on your cardiac medications before your next appointment, please call your pharmacy*  Lab Work: NONE ordered at this time of appointment   If you have labs (blood work) drawn today and your tests are completely normal, you will receive your results only by: [place] (if you have MyChart) OR A paper copy in the mail If you have any lab test that is abnormal or we need to change your treatment, we will call you to review the results.  Testing/Procedures: NONE ordered at this time of appointment   Follow-Up: At [facility], you and your health needs are our priority.  As part of our continuing mission to provide you with exceptional heart care, we have created designated Provider Care Teams.  These Care Teams include your primary Cardiologist (physician) and Advanced Practice Providers (APPs -  Physician Assistants and Nurse Practitioners) who all work together to provide you with the care you need, when you need it.   Your next appointment:   1 year(s)  Provider:   Evalina Field, MD     Other Instructions

## 2022-03-18 NOTE — Progress Notes (Signed)
Cardiology Office Note:    Date:  03/20/2022   ID:  Stephen Oconnell, DOB 1945-10-21, MRN [id number]  PCP:  Stephen Hatchet, MD   [place] Providers Cardiologist:  Stephen Field, MD     Referring MD: Stephen Hatchet, MD   Chief Complaint  Patient presents with   Follow-up    Seen for Dr. Audie Oconnell    History of Present Illness:    Stephen Oconnell is a 77 y.o. male with a hx of COPD, GERD, RBBB, hypertension, obstructive sleep apnea on CPAP therapy and history of coronary artery calcification.  Coronary calcium score test on 12/13/2019 showed coronary artery calcification in the LAD and RCA territory, coronary calcium score of 138 which placed the patient at 47th percentile for age and sex matched control. Echocardiogram obtained in November 2021 showed EF 60 to 65%, normal RV, no significant valve issue.  He was previously complaining of some dizziness, however Dr. Audie Oconnell felt this is likely related to dehydration.  He was last seen by Dr. Audie Oconnell back in December 2022 at which time he was doing well.  Patient presents today for follow-up.  He denies any recent chest pain or worsening dyspnea.  He has no lower extremity edema, orthopnea or PND.  Last blood work was obtained in March 2023 by PCP which showed total cholesterol 99, HDL 46, LDL 41 and triglycerides 62.  He is on Crestor 5 mg daily.  He is also on losartan and the Maxide for blood pressure control.  Overall, he is doing well and can follow-up in 1 year.  Past Medical History:  Diagnosis Date   Allergies    Asthma    on inhaler   Constipation    COPD (chronic obstructive pulmonary disease) (HCC)    Emphysema lung (HCC)    GERD (gastroesophageal reflux disease)    Glaucoma    right eye   Hearing loss    Bil/has hearing aids   HTN (hypertension)    Leg swelling    Migraines    Obesity    OSA on CPAP    Skin cancer of forehead     Past Surgical History:  Procedure Laterality Date   CATARACT EXTRACTION      MOHS SURGERY     WISDOM TOOTH EXTRACTION      Current Medications: Current Meds  Medication Sig   Albuterol Sulfate (PROAIR HFA IN) Inhale into the lungs as needed.   aspirin EC 81 MG tablet Take 1 tablet (81 mg total) by mouth daily. Swallow whole.   buPROPion (WELLBUTRIN SR) 150 MG 12 hr tablet Take 1 tablet (150 mg total) by mouth daily.   escitalopram (LEXAPRO) 10 MG tablet Take 10 mg by mouth daily.   losartan (COZAAR) 50 MG tablet Take 50 mg by mouth daily.   metFORMIN (GLUCOPHAGE) 500 MG tablet Take 1 tablet (500 mg total) by mouth 2 (two) times daily with a meal.   polyethylene glycol (MIRALAX / GLYCOLAX) 17 g packet Take 17 g by mouth daily as needed.   rosuvastatin (CRESTOR) 5 MG tablet Take 5 mg by mouth daily.   triamterene-hydrochlorothiazide (MAXZIDE-25) 37.5-25 MG tablet Take 1 tablet by mouth daily.     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  Tobacco Use   Smoking status: Never   Smokeless tobacco: Never  Vaping Use   Vaping Use: Never used  Substance and Sexual Activity   Alcohol use: No   Drug use: No   Sexual activity: Not on file  Other Topics Concern   Not on file  Social History Narrative   Not on file   Social Determinants of Health   Financial Resource Strain: Not on file  Food Insecurity: Not on file  Transportation Needs: Not on file  Physical Activity: Not on file  Stress: Not on file  Social Connections: Not on file     Family History: The patient's family history includes Anxiety disorder in his father; Depression in his father; Heart disease in his father; Hypertension in his father; Mental illness in his sister; Obesity in his mother; Stroke in his mother; Sudden death in his father; Thyroid disease in his  mother.  ROS:   Please see the history of present illness.     All other systems reviewed and are negative.  EKGs/Labs/Other Studies Reviewed:    The following studies were reviewed today:  Echo 01/28/2020  1. Left ventricular ejection fraction, by estimation, is 60 to 65%. The  left ventricle has normal function. The left ventricle has no regional  wall motion abnormalities. Left ventricular diastolic parameters are  consistent with Grade I diastolic  dysfunction (impaired relaxation).   2. Right ventricular systolic function is normal. The right ventricular  size is normal.   3. The mitral valve is normal in structure. No evidence of mitral valve  regurgitation. No evidence of mitral stenosis.   4. The aortic valve is tricuspid. Aortic valve regurgitation is not  visualized. Mild aortic valve sclerosis is present, with no evidence of  aortic valve stenosis.   5. The inferior vena cava is normal in size with greater than 50%  respiratory variability, suggesting right atrial pressure of 3 mmHg.    EKG:  EKG is ordered today.  The ekg ordered today demonstrates normal sinus rhythm, right bundle branch block.  Recent Labs: No results found for requested labs within last 365 days.  Recent Lipid Panel    Component Value Date/Time   CHOL 90 (L) 03/19/2021 1002   TRIG 61 03/19/2021 1002   HDL 48 03/19/2021 1002   LDLCALC 28 03/19/2021 1002     Risk Assessment/Calculations:           Physical Exam:    VS:  BP 138/72   Pulse 63   Ht '5\' 9"'$  (1.753 m)   Wt 239 lb 12.8 oz (108.8 kg)   SpO2 96%   BMI 35.41 kg/m        Wt Readings from Last 3 Encounters:  03/18/22 239 lb 12.8 oz (108.8 kg)  02/09/22 231 lb (104.8 kg)  01/05/22 230 lb (104.3 kg)     GEN:  Well nourished, well developed in no acute distress HEENT: Normal NECK: No JVD; No carotid bruits LYMPHATICS: No lymphadenopathy CARDIAC: RRR, no murmurs, rubs, gallops RESPIRATORY:  Clear to auscultation  without rales, wheezing or rhonchi  ABDOMEN: Soft, non-tender, non-distended MUSCULOSKELETAL:  No edema; No deformity  SKIN: Warm and dry NEUROLOGIC:  Alert and oriented x 3 PSYCHIATRIC:  Normal affect   ASSESSMENT:    1. Coronary artery calcification   2. RBBB   3. Essential hypertension    PLAN:    In order of problems listed above:  Coronary artery calcification: Previous coronary calcium  scoring test yet October 2021 showed coronary calcium score 138 which placed the patient on 47th percentile for age and sex matched control.  Echocardiogram obtained at the same time showed normal EF.  Patient denies any recent exertional chest pain or worsening dyspnea.  Continue 81 mg aspirin and rosuvastatin, cholesterol very well-controlled  RBBB: Seen on EKG, unchanged.  Echocardiogram in 2021 was normal.  Hypertension: Blood pressure stable           Medication Adjustments/Labs and Tests Ordered: Current medicines are reviewed at length with the patient today.  Concerns regarding medicines are outlined above.  Orders Placed This Encounter  Procedures   EKG 12-Lead   No orders of the defined types were placed in this encounter.   Patient Instructions  Medication Instructions:   Your physician recommends that you continue on your current medications as directed. Please refer to the Current Medication list given to you today.  *If you need a refill on your cardiac medications before your next appointment, please call your pharmacy*  Lab Work: NONE ordered at this time of appointment   If you have labs (blood work) drawn today and your tests are completely normal, you will receive your results only by: [place] (if you have MyChart) OR A paper copy in the mail If you have any lab test that is abnormal or we need to change your treatment, we will call you to review the results.  Testing/Procedures: NONE ordered at this time of appointment   Follow-Up: At [facility], you and your health needs are our priority.  As part of our continuing mission to provide you with exceptional heart care, we have created designated Provider Care Teams.  These Care Teams include your primary Cardiologist (physician) and Advanced Practice Providers (APPs -  Physician Assistants and Nurse Practitioners) who all work together to provide you with the care you need, when you need it.   Your next appointment:   1 year(s)  Provider:   Evalina Field, MD     Other Instructions    Signed, Almyra Deforest, [place]  03/20/2022 8:55 PM    [place]

## 2022-03-20 ENCOUNTER — Encounter: Payer: Self-pay | Admitting: Physician Assistant

## 2022-03-23 ENCOUNTER — Encounter (INDEPENDENT_AMBULATORY_CARE_PROVIDER_SITE_OTHER): Payer: Self-pay | Admitting: Family Medicine

## 2022-03-23 ENCOUNTER — Ambulatory Visit (INDEPENDENT_AMBULATORY_CARE_PROVIDER_SITE_OTHER): Payer: Medicare Other | Admitting: Family Medicine

## 2022-03-23 VITALS — BP 147/65 | HR 69 | Temp 97.6°F | Ht 69.0 in | Wt 231.0 lb

## 2022-03-23 DIAGNOSIS — I10 Essential (primary) hypertension: Secondary | ICD-10-CM

## 2022-03-23 DIAGNOSIS — Z6834 Body mass index (BMI) 34.0-34.9, adult: Secondary | ICD-10-CM | POA: Diagnosis not present

## 2022-03-23 DIAGNOSIS — E669 Obesity, unspecified: Secondary | ICD-10-CM | POA: Diagnosis not present

## 2022-03-26 ENCOUNTER — Other Ambulatory Visit: Payer: Self-pay | Admitting: Medical

## 2022-03-26 DIAGNOSIS — M25551 Pain in right hip: Secondary | ICD-10-CM | POA: Insufficient documentation

## 2022-03-29 ENCOUNTER — Ambulatory Visit
Admission: RE | Admit: 2022-03-29 | Discharge: 2022-03-29 | Disposition: A | Discharge status: Home / Self Care | Payer: Medicare Other | Source: Ambulatory Visit | Attending: Medical | Admitting: Medical

## 2022-03-29 ENCOUNTER — Other Ambulatory Visit: Payer: Self-pay | Admitting: Medical

## 2022-03-29 DIAGNOSIS — M545 Low back pain, unspecified: Secondary | ICD-10-CM

## 2022-03-29 DIAGNOSIS — M25551 Pain in right hip: Secondary | ICD-10-CM

## 2022-03-30 NOTE — Progress Notes (Unsigned)
Chief Complaint:   OBESITY Stephen Oconnell is here to discuss his progress with his obesity treatment plan along with follow-up of his obesity related diagnoses. Stephen Oconnell is on the Category 3 Plan and states he is following his eating plan approximately 75% of the time. Stephen Oconnell states he is walking for 45-60 minutes 3-5 times per week.  Today's visit was #: 25 Starting weight: 248 lbs Starting date: 12/20/2019 Today's weight: 231 lbs Today's date: 03/23/2022 Total lbs lost to date: 17 Total lbs lost since last in-office visit: 0  Interim History: Stephen Oconnell has done well with avoiding holiday weight gain.  He is working on losing another 15-20 pounds this year.  Subjective:   1. Essential hypertension Stephen Oconnell's blood pressure is elevated today, but it is normally well-controlled.  He denies chest pain or headache.  Assessment/Plan:   1. Essential hypertension Stephen Oconnell is to get back to his diet and weight loss, and we will recheck his blood pressure in the office at his next visit in 4 weeks.  2. Obesity, Current BMI 34.2 Stephen Oconnell is currently in the action stage of change. As such, his goal is to continue with weight loss efforts. He has agreed to the Category 3 Plan and following a lower carbohydrate, vegetable and lean protein rich diet plan.   Patient is to do the low carbohydrate plan for 2 weeks and then the category 3 plan for 2 weeks.  Exercise goals: Add strengthening exercise.   Behavioral modification strategies: increasing lean protein intake, meal planning and cooking strategies, and travel eating strategies.  Stephen Oconnell has agreed to follow-up with our clinic in 4 weeks. He was informed of the importance of frequent follow-up visits to maximize his success with intensive lifestyle modifications for his multiple health conditions.   Objective:   Blood pressure (!) 147/65, pulse 69, temperature 97.6 F (36.4 C), height '5\' 9"'$  (1.753 m), weight 231 lb (104.8 kg), SpO2 96 %. Body mass index is 34.11  kg/m.  General: Cooperative, alert, well developed, in no acute distress. HEENT: Conjunctivae and lids unremarkable. Cardiovascular: Regular rhythm.  Lungs: Normal work of breathing. Neurologic: No focal deficits.   Lab Results  Component Value Date   CREATININE 1.16 03/19/2021   BUN 24 03/19/2021   NA 140 03/19/2021   K 4.8 03/19/2021   CL 98 03/19/2021   CO2 28 03/19/2021   Lab Results  Component Value Date   ALT 20 03/19/2021   AST 17 03/19/2021   ALKPHOS 56 03/19/2021   BILITOT 0.6 03/19/2021   Lab Results  Component Value Date   HGBA1C 5.5 03/19/2021   HGBA1C 5.5 09/18/2020   HGBA1C 5.6 04/08/2020   HGBA1C 6.0 (H) 12/20/2019   Lab Results  Component Value Date   INSULIN 15.1 03/19/2021   INSULIN 17.4 09/18/2020   INSULIN 17.2 04/08/2020   INSULIN 29.5 (H) 12/20/2019   Lab Results  Component Value Date   TSH 2.450 12/20/2019   Lab Results  Component Value Date   CHOL 90 (L) 03/19/2021   HDL 48 03/19/2021   LDLCALC 28 03/19/2021   TRIG 61 03/19/2021   Lab Results  Component Value Date   VD25OH 51.2 03/19/2021   VD25OH 91.9 09/18/2020   VD25OH 72.8 04/08/2020   Lab Results  Component Value Date   WBC 6.0 12/20/2019   HGB 16.6 12/20/2019   HCT 49.4 12/20/2019   MCV 96 12/20/2019   PLT 209 12/20/2019   No results found for: "IRON", "TIBC", "FERRITIN"  Attestation Statements:   Reviewed by clinician on day of visit: allergies, medications, problem list, medical history, surgical history, family history, social history, and previous encounter notes.  Time spent on visit including pre-visit chart review and post-visit care and charting was 30 minutes.   I, Trixie Dredge, am acting as transcriptionist for [name], MD.  I have reviewed the above documentation for accuracy and completeness, and I agree with the above. -  [name], MD

## 2022-04-20 ENCOUNTER — Ambulatory Visit (INDEPENDENT_AMBULATORY_CARE_PROVIDER_SITE_OTHER): Payer: Medicare Other | Admitting: Family Medicine

## 2022-04-20 ENCOUNTER — Encounter (INDEPENDENT_AMBULATORY_CARE_PROVIDER_SITE_OTHER): Payer: Self-pay | Admitting: Family Medicine

## 2022-04-20 VITALS — BP 134/61 | HR 68 | Temp 97.8°F | Ht 69.0 in | Wt 225.0 lb

## 2022-04-20 DIAGNOSIS — R7303 Prediabetes: Secondary | ICD-10-CM

## 2022-04-20 DIAGNOSIS — E559 Vitamin D deficiency, unspecified: Secondary | ICD-10-CM

## 2022-04-20 DIAGNOSIS — E782 Mixed hyperlipidemia: Secondary | ICD-10-CM | POA: Diagnosis not present

## 2022-04-20 DIAGNOSIS — Z6834 Body mass index (BMI) 34.0-34.9, adult: Secondary | ICD-10-CM | POA: Insufficient documentation

## 2022-04-20 DIAGNOSIS — F5089 Other specified eating disorder: Secondary | ICD-10-CM | POA: Diagnosis not present

## 2022-04-20 DIAGNOSIS — Z6832 Body mass index (BMI) 32.0-32.9, adult: Secondary | ICD-10-CM | POA: Insufficient documentation

## 2022-04-20 DIAGNOSIS — Z6833 Body mass index (BMI) 33.0-33.9, adult: Secondary | ICD-10-CM | POA: Insufficient documentation

## 2022-04-20 DIAGNOSIS — E669 Obesity, unspecified: Secondary | ICD-10-CM | POA: Insufficient documentation

## 2022-04-20 MED ORDER — BUPROPION HCL ER (SR) 150 MG PO TB12: 150.0000 mg | ORAL_TABLET | Freq: Every day | ORAL | 0 refills | Status: DC

## 2022-04-20 MED ORDER — METFORMIN HCL 500 MG PO TABS: 500.0000 mg | ORAL_TABLET | Freq: Two times a day (BID) | ORAL | 0 refills | Status: DC

## 2022-04-21 LAB — VITAMIN D 25 HYDROXY (VIT D DEFICIENCY, FRACTURES): Vit D, 25-Hydroxy: 47 ng/mL (ref 30.0–100.0)

## 2022-04-21 LAB — LIPID PANEL WITH LDL/HDL RATIO
Cholesterol, Total: 90 mg/dL — ABNORMAL LOW (ref 100–199)
HDL: 47 mg/dL (ref >39)
LDL Chol Calc (NIH): 29 mg/dL (ref 0–99)
LDL/HDL Ratio: 0.6 ratio (ref 0.0–3.6)
Triglycerides: 60 mg/dL (ref 0–149)
VLDL Cholesterol Cal: 14 mg/dL (ref 5–40)

## 2022-04-21 LAB — VITAMIN B12: Vitamin B-12: 606 pg/mL (ref 232–1245)

## 2022-04-21 LAB — INSULIN, RANDOM: INSULIN: 15.7 u[IU]/mL (ref 2.6–24.9)

## 2022-04-21 LAB — HEMOGLOBIN A1C
Est. average glucose Bld gHb Est-mCnc: 117 mg/dL
Hgb A1c MFr Bld: 5.7 % — ABNORMAL HIGH (ref 4.8–5.6)

## 2022-05-04 NOTE — Progress Notes (Unsigned)
Chief Complaint:   OBESITY Stephen Oconnell is here to discuss his progress with his obesity treatment plan along with follow-up of his obesity related diagnoses. Stephen Oconnell is on the Category 3 Plan or following a lower carbohydrate, vegetable and lean protein rich diet plan and states he is following his eating plan approximately 75% of the time. Stephen Oconnell states he is doing 0 minutes 0 times per week.  Today's visit was #: 79 Starting weight: 248 lbs Starting date: 12/20/2019 Today's weight: 225 lbs Today's date: 04/20/2022 Total lbs lost to date: 23 Total lbs lost since last in-office visit: 6  Interim History: Stephen Oconnell has been sick recently in addition to taking a fall and cracking some vertebrae. He notes his hunger is more controlled. He did low carbohydrates for 2 weeks and felt this helps.   Subjective:   1. Pre-diabetes Stephen Oconnell is on metformin and he is working on his diet. He notes decreased polyphagia.   2. Mixed hyperlipidemia Stephen Oconnell is on Crestor, and he is working on his diet. He is due for labs to monitor his progress.   3. Vitamin D deficiency Stephen Oconnell is no longer on Vitamin D prescription, and his level may have dropped below goal.   4. Emotional Eating Behavior Stephen Oconnell is on Wellbutrin, and his blood pressure is not elevated. No side effects were noted.   Assessment/Plan:   1. Pre-diabetes We will check labs today, and we will refill metformin for 90 days.   - metFORMIN (GLUCOPHAGE) 500 MG tablet; Take 1 tablet (500 mg total) by mouth 2 (two) times daily with a meal.  Dispense: 180 tablet; Refill: 0 - Hemoglobin A1c - Insulin, random - Vitamin B12  2. Mixed hyperlipidemia We will check labs today, and we will follow-up at Stephen Oconnell's next visit.   - Lipid Panel With LDL/HDL Ratio  3. Vitamin D deficiency We will check labs today, and we will follow-up at Stephen Oconnell next visit.   - VITAMIN D 25 Hydroxy (Vit-D Deficiency, Fractures)  4. Emotional Eating Behavior We will refill Wellbutrin  SR for 90 days. Stephen Oconnell will continue to work on decreasing emotional eating behaviors.   - buPROPion (WELLBUTRIN SR) 150 MG 12 hr tablet; Take 1 tablet (150 mg total) by mouth daily.  Dispense: 90 tablet; Refill: 0  5. BMI 33.0-33.9,adult  6. Obesity, Beginning BMI 36.62 Stephen Oconnell is currently in the action stage of change. As such, his goal is to continue with weight loss efforts. He has agreed to the Category 3 Plan.   Behavioral modification strategies: increasing water intake.  Stephen Oconnell has agreed to follow-up with our clinic in 4 weeks. He was informed of the importance of frequent follow-up visits to maximize his success with intensive lifestyle modifications for his multiple health conditions.   Stephen Oconnell was informed we would discuss his lab results at his next visit unless there is a critical issue that needs to be addressed sooner. Stephen Oconnell agreed to keep his next visit at the agreed upon time to discuss these results.  Objective:   Blood pressure 134/61, pulse 68, temperature 97.8 F (36.6 C), height '5\' 9"'$  (1.753 m), weight 225 lb (102.1 kg), SpO2 95 %. Body mass index is 33.23 kg/m.  General: Cooperative, alert, well developed, in no acute distress. HEENT: Conjunctivae and lids unremarkable. Cardiovascular: Regular rhythm.  Lungs: Normal work of breathing. Neurologic: No focal deficits.   Lab Results  Component Value Date   CREATININE 1.16 03/19/2021   BUN 24 03/19/2021   NA 140  03/19/2021   K 4.8 03/19/2021   CL 98 03/19/2021   CO2 28 03/19/2021   Lab Results  Component Value Date   ALT 20 03/19/2021   AST 17 03/19/2021   ALKPHOS 56 03/19/2021   BILITOT 0.6 03/19/2021   Lab Results  Component Value Date   HGBA1C 5.7 (H) 04/20/2022   HGBA1C 5.5 03/19/2021   HGBA1C 5.5 09/18/2020   HGBA1C 5.6 04/08/2020   HGBA1C 6.0 (H) 12/20/2019   Lab Results  Component Value Date   INSULIN 15.7 04/20/2022   INSULIN 15.1 03/19/2021   INSULIN 17.4 09/18/2020   INSULIN 17.2 04/08/2020    INSULIN 29.5 (H) 12/20/2019   Lab Results  Component Value Date   TSH 2.450 12/20/2019   Lab Results  Component Value Date   CHOL 90 (L) 04/20/2022   HDL 47 04/20/2022   LDLCALC 29 04/20/2022   TRIG 60 04/20/2022   Lab Results  Component Value Date   VD25OH 47.0 04/20/2022   VD25OH 51.2 03/19/2021   VD25OH 91.9 09/18/2020   Lab Results  Component Value Date   WBC 6.0 12/20/2019   HGB 16.6 12/20/2019   HCT 49.4 12/20/2019   MCV 96 12/20/2019   PLT 209 12/20/2019   No results found for: "IRON", "TIBC", "FERRITIN"  Attestation Statements:   Reviewed by clinician on day of visit: allergies, medications, problem list, medical history, surgical history, family history, social history, and previous encounter notes.   I, Trixie Dredge, am acting as transcriptionist for [name], MD.  I have reviewed the above documentation for accuracy and completeness, and I agree with the above. -  [name], MD

## 2022-05-25 ENCOUNTER — Ambulatory Visit (INDEPENDENT_AMBULATORY_CARE_PROVIDER_SITE_OTHER): Payer: Medicare Other | Admitting: Family Medicine

## 2022-05-25 ENCOUNTER — Encounter (INDEPENDENT_AMBULATORY_CARE_PROVIDER_SITE_OTHER): Payer: Self-pay | Admitting: Family Medicine

## 2022-05-25 VITALS — BP 132/55 | HR 72 | Temp 97.6°F | Ht 69.0 in | Wt 227.0 lb

## 2022-05-25 DIAGNOSIS — Z6833 Body mass index (BMI) 33.0-33.9, adult: Secondary | ICD-10-CM | POA: Diagnosis not present

## 2022-05-25 DIAGNOSIS — J3089 Other allergic rhinitis: Secondary | ICD-10-CM

## 2022-05-25 DIAGNOSIS — E669 Obesity, unspecified: Secondary | ICD-10-CM

## 2022-05-25 DIAGNOSIS — F5089 Other specified eating disorder: Secondary | ICD-10-CM | POA: Diagnosis not present

## 2022-05-31 NOTE — Progress Notes (Unsigned)
Chief Complaint:   OBESITY Stephen Oconnell is here to discuss his progress with his obesity treatment plan along with follow-up of his obesity related diagnoses. Stephen Oconnell is on the Category 3 Plan and states he is following his eating plan approximately 70% of the time. Stephen Oconnell states he is walking for 60 minutes 3-4 times per week.  Today's visit was #: 45 Starting weight: 248 lbs Starting date: 12/20/2019 Today's weight: 227 lbs Today's date: 05/25/2022 Total lbs lost to date: 21 Total lbs lost since last in-office visit: 0  Interim History: Stephen Oconnell is retaining some water weight. He notes some increase in snacking and he is ready to get more structured with his eating.   Subjective:   1. Allergic rhinitis due to other allergic trigger, unspecified seasonality Jayen has done allergy shots and Claritin but he notes his symptoms have still worsened this Spring.   2. Emotional Eating Behavior Chayim notes increased snacking recently and this is likely stalling his weight loss efforts.   Assessment/Plan:   1. Allergic rhinitis due to other allergic trigger, unspecified seasonality I recommended starting Flonase daily OTC in addition to his OTC Claritin.   2. Emotional Eating Behavior [place] will continue Wellbutrin. He declined to increase dose, and will wait another month and reassess his progress.   3. BMI 33.0-33.9,adult  4. Obesity, Beginning BMI 36.62 Stephen Oconnell is currently in the action stage of change. As such, his goal is to continue with weight loss efforts. He has agreed to the following a lower carbohydrate, vegetable and lean protein rich diet plan for 2 weeks, then change to the Category 3 plan for the next 2 weeks.   Exercise goals: For substantial health benefits, adults should do at least 150 minutes (2 hours and 30 minutes) a week of moderate-intensity, or 75 minutes (1 hour and 15 minutes) a week of vigorous-intensity aerobic physical activity, or an equivalent combination of moderate-  and vigorous-intensity aerobic activity. Aerobic activity should be performed in episodes of at least 10 minutes, and preferably, it should be spread throughout the week.  Behavioral modification strategies: meal planning and cooking strategies and ways to avoid night time snacking.  Stephen Oconnell has agreed to follow-up with our clinic in 4 weeks. He was informed of the importance of frequent follow-up visits to maximize his success with intensive lifestyle modifications for his multiple health conditions.   Objective:   Blood pressure (!) 132/55, pulse 72, temperature 97.6 F (36.4 C), height 5\' 9"  (1.753 m), weight 227 lb (103 kg), SpO2 97 %. Body mass index is 33.52 kg/m.  Lab Results  Component Value Date   CREATININE 1.16 03/19/2021   BUN 24 03/19/2021   NA 140 03/19/2021   K 4.8 03/19/2021   CL 98 03/19/2021   CO2 28 03/19/2021   Lab Results  Component Value Date   ALT 20 03/19/2021   AST 17 03/19/2021   ALKPHOS 56 03/19/2021   BILITOT 0.6 03/19/2021   Lab Results  Component Value Date   HGBA1C 5.7 (H) 04/20/2022   HGBA1C 5.5 03/19/2021   HGBA1C 5.5 09/18/2020   HGBA1C 5.6 04/08/2020   HGBA1C 6.0 (H) 12/20/2019   Lab Results  Component Value Date   INSULIN 15.7 04/20/2022   INSULIN 15.1 03/19/2021   INSULIN 17.4 09/18/2020   INSULIN 17.2 04/08/2020   INSULIN 29.5 (H) 12/20/2019   Lab Results  Component Value Date   TSH 2.450 12/20/2019   Lab Results  Component Value Date   CHOL  90 (L) 04/20/2022   HDL 47 04/20/2022   LDLCALC 29 04/20/2022   TRIG 60 04/20/2022   Lab Results  Component Value Date   VD25OH 47.0 04/20/2022   VD25OH 51.2 03/19/2021   VD25OH 91.9 09/18/2020   Lab Results  Component Value Date   WBC 6.0 12/20/2019   HGB 16.6 12/20/2019   HCT 49.4 12/20/2019   MCV 96 12/20/2019   PLT 209 12/20/2019   No results found for: "IRON", "TIBC", "FERRITIN"  Attestation Statements:   Reviewed by clinician on day of visit: allergies, medications,  problem list, medical history, surgical history, family history, social history, and previous encounter notes.  Time spent on visit including pre-visit chart review and post-visit care and charting was 30 minutes.   I, Trixie Dredge, am acting as transcriptionist for [name], MD.  I have reviewed the above documentation for accuracy and completeness, and I agree with the above. -  [name], MD

## 2022-06-29 ENCOUNTER — Ambulatory Visit (INDEPENDENT_AMBULATORY_CARE_PROVIDER_SITE_OTHER): Payer: Medicare Other | Admitting: Family Medicine

## 2022-06-30 ENCOUNTER — Other Ambulatory Visit (HOSPITAL_COMMUNITY): Payer: Self-pay | Admitting: Internal Medicine

## 2022-06-30 DIAGNOSIS — Z0000 Encounter for general adult medical examination without abnormal findings: Secondary | ICD-10-CM

## 2022-06-30 DIAGNOSIS — Z87891 Personal history of nicotine dependence: Secondary | ICD-10-CM

## 2022-07-07 ENCOUNTER — Ambulatory Visit (HOSPITAL_COMMUNITY)
Admission: RE | Admit: 2022-07-07 | Discharge: 2022-07-07 | Disposition: A | Discharge status: Home / Self Care | Payer: Medicare Other | Source: Ambulatory Visit | Attending: Cardiovascular Disease | Admitting: Cardiovascular Disease

## 2022-07-07 DIAGNOSIS — I10 Essential (primary) hypertension: Secondary | ICD-10-CM | POA: Diagnosis not present

## 2022-07-07 DIAGNOSIS — Z136 Encounter for screening for cardiovascular disorders: Secondary | ICD-10-CM | POA: Insufficient documentation

## 2022-07-07 DIAGNOSIS — E785 Hyperlipidemia, unspecified: Secondary | ICD-10-CM | POA: Diagnosis not present

## 2022-07-07 DIAGNOSIS — Z0000 Encounter for general adult medical examination without abnormal findings: Secondary | ICD-10-CM | POA: Diagnosis present

## 2022-07-07 DIAGNOSIS — Z87891 Personal history of nicotine dependence: Secondary | ICD-10-CM | POA: Insufficient documentation

## 2022-07-25 ENCOUNTER — Other Ambulatory Visit (INDEPENDENT_AMBULATORY_CARE_PROVIDER_SITE_OTHER): Payer: Self-pay | Admitting: Family Medicine

## 2022-07-25 DIAGNOSIS — F5089 Other specified eating disorder: Secondary | ICD-10-CM

## 2022-07-25 DIAGNOSIS — R7303 Prediabetes: Secondary | ICD-10-CM

## 2022-08-10 ENCOUNTER — Encounter (INDEPENDENT_AMBULATORY_CARE_PROVIDER_SITE_OTHER): Payer: Self-pay | Admitting: Family Medicine

## 2022-08-10 ENCOUNTER — Ambulatory Visit (INDEPENDENT_AMBULATORY_CARE_PROVIDER_SITE_OTHER): Payer: Medicare Other | Admitting: Family Medicine

## 2022-08-10 VITALS — BP 124/73 | HR 70 | Temp 97.9°F | Ht 69.0 in | Wt 225.0 lb

## 2022-08-10 DIAGNOSIS — Z6833 Body mass index (BMI) 33.0-33.9, adult: Secondary | ICD-10-CM

## 2022-08-10 DIAGNOSIS — I10 Essential (primary) hypertension: Secondary | ICD-10-CM

## 2022-08-10 DIAGNOSIS — F5089 Other specified eating disorder: Secondary | ICD-10-CM | POA: Diagnosis not present

## 2022-08-10 DIAGNOSIS — E7849 Other hyperlipidemia: Secondary | ICD-10-CM | POA: Insufficient documentation

## 2022-08-10 DIAGNOSIS — R7303 Prediabetes: Secondary | ICD-10-CM

## 2022-08-10 DIAGNOSIS — E669 Obesity, unspecified: Secondary | ICD-10-CM

## 2022-08-10 MED ORDER — METFORMIN HCL 500 MG PO TABS: 500.0000 mg | ORAL_TABLET | Freq: Two times a day (BID) | ORAL | 0 refills | Status: DC

## 2022-08-10 MED ORDER — BUPROPION HCL ER (SR) 150 MG PO TB12: 150.0000 mg | ORAL_TABLET | Freq: Every day | ORAL | 0 refills | Status: DC

## 2022-08-10 NOTE — Progress Notes (Unsigned)
Chief Complaint:   OBESITY Stephen Oconnell is here to discuss his progress with his obesity treatment plan along with follow-up of his obesity related diagnoses. Stephen Oconnell is on the Category 3 Plan and states he is following his eating plan approximately 50% of the time. Stephen Oconnell states he is walking for 60 minutes 3-4 times per week.  Today's visit was #: 45 Starting weight: 248 lbs Starting date: 12/20/2019 Today's weight: 225 lbs Today's date: 08/10/2022 Total lbs lost to date: 23 Total lbs lost since last in-office visit: 2  Interim History: Patient continues to do well with his diet despite traveling.  He is staying active and working on increasing his protein and even if his calories go higher when he is eating out.  His sleep is good and he feels well overall.  Subjective:   1. Other hyperlipidemia Patient is on Crestor and his diet, and his recent cholesterol panel was very well-controlled.  He denies chest pain or myalgia.  2. Pre-diabetes Patient's recent A1c was 5.7.  He continues to work on decreasing simple carbohydrates.  3. Essential hypertension Patient's blood pressure is well-controlled with his diet and medications.  He has no signs of low blood pressure or electrolyte imbalance.  4. Emotional Eating Behavior Patient notes increased stress as his work is getting busier than he normally likes, but he is still doing well with minimizing emotional eating behavior.  His blood pressure is normal, and he denies side effects.  Assessment/Plan:   1. Other hyperlipidemia Patient will continue Crestor 5 mg and continue with his diet.  We will continue to monitor.  2. Pre-diabetes Patient will continue metformin, and we will refill for 90 days.  - metFORMIN (GLUCOPHAGE) 500 MG tablet; Take 1 tablet (500 mg total) by mouth 2 (two) times daily with a meal.  Dispense: 180 tablet; Refill: 0  3. Essential hypertension Patient will continue losartan and Maxzide, and we will continue to  monitor.  4. Emotional Eating Behavior Patient will continue Wellbutrin SR, and we will refill for 90 days.  - buPROPion (WELLBUTRIN SR) 150 MG 12 hr tablet; Take 1 tablet (150 mg total) by mouth daily.  Dispense: 90 tablet; Refill: 0  5. BMI 33.0-33.9,adult  6. Obesity, WIth starting BMI 36.62 Stephen Oconnell is currently in the action stage of change. As such, his goal is to continue with weight loss efforts. He has agreed to the Category 3 Plan or following a lower carbohydrate, vegetable and lean protein rich diet plan.   Exercise goals: As is.   Behavioral modification strategies: increasing lean protein intake.  Stephen Oconnell has agreed to follow-up with our clinic in 4 weeks. He was informed of the importance of frequent follow-up visits to maximize his success with intensive lifestyle modifications for his multiple health conditions.   Objective:   Blood pressure 124/73, pulse 70, temperature 97.9 F (36.6 C), height 5\' 9"  (1.753 m), weight 225 lb (102.1 kg), SpO2 95 %. Body mass index is 33.23 kg/m.  Lab Results  Component Value Date   CREATININE 1.16 03/19/2021   BUN 24 03/19/2021   NA 140 03/19/2021   K 4.8 03/19/2021   CL 98 03/19/2021   CO2 28 03/19/2021   Lab Results  Component Value Date   ALT 20 03/19/2021   AST 17 03/19/2021   ALKPHOS 56 03/19/2021   BILITOT 0.6 03/19/2021   Lab Results  Component Value Date   HGBA1C 5.7 (H) 04/20/2022   HGBA1C 5.5 03/19/2021   HGBA1C  5.5 09/18/2020   HGBA1C 5.6 04/08/2020   HGBA1C 6.0 (H) 12/20/2019   Lab Results  Component Value Date   INSULIN 15.7 04/20/2022   INSULIN 15.1 03/19/2021   INSULIN 17.4 09/18/2020   INSULIN 17.2 04/08/2020   INSULIN 29.5 (H) 12/20/2019   Lab Results  Component Value Date   TSH 2.450 12/20/2019   Lab Results  Component Value Date   CHOL 90 (L) 04/20/2022   HDL 47 04/20/2022   LDLCALC 29 04/20/2022   TRIG 60 04/20/2022   Lab Results  Component Value Date   VD25OH 47.0 04/20/2022    VD25OH 51.2 03/19/2021   VD25OH 91.9 09/18/2020   Lab Results  Component Value Date   WBC 6.0 12/20/2019   HGB 16.6 12/20/2019   HCT 49.4 12/20/2019   MCV 96 12/20/2019   PLT 209 12/20/2019   No results found for: "IRON", "TIBC", "FERRITIN"  Attestation Statements:   Reviewed by clinician on day of visit: allergies, medications, problem list, medical history, surgical history, family history, social history, and previous encounter notes.   I, Burt Knack, am acting as transcriptionist for Quillian Quince, MD.  I have reviewed the above documentation for accuracy and completeness, and I agree with the above. -  Quillian Quince, MD

## 2022-09-15 ENCOUNTER — Encounter (INDEPENDENT_AMBULATORY_CARE_PROVIDER_SITE_OTHER): Payer: Self-pay | Admitting: Family Medicine

## 2022-09-15 ENCOUNTER — Ambulatory Visit (INDEPENDENT_AMBULATORY_CARE_PROVIDER_SITE_OTHER): Payer: Medicare Other | Admitting: Family Medicine

## 2022-09-15 VITALS — BP 121/64 | HR 69 | Temp 97.4°F | Ht 69.0 in | Wt 230.0 lb

## 2022-09-15 DIAGNOSIS — Z6834 Body mass index (BMI) 34.0-34.9, adult: Secondary | ICD-10-CM

## 2022-09-15 DIAGNOSIS — Z6833 Body mass index (BMI) 33.0-33.9, adult: Secondary | ICD-10-CM

## 2022-09-15 DIAGNOSIS — R7303 Prediabetes: Secondary | ICD-10-CM | POA: Diagnosis not present

## 2022-09-15 DIAGNOSIS — E669 Obesity, unspecified: Secondary | ICD-10-CM | POA: Diagnosis not present

## 2022-09-15 NOTE — Progress Notes (Signed)
Chief Complaint:   OBESITY Stephen Oconnell is here to discuss his progress with his obesity treatment plan along with follow-up of his obesity related diagnoses. Stephen Oconnell is on the Category 3 Plan or following a lower carbohydrate, vegetable and lean protein rich diet plan and states he is following his eating plan approximately 65% of the time. Stephen Oconnell states he is walking for 45 minutes 3-4 times per week.  Today's visit was #: 46 Starting weight: 248 lbs Starting date: 12/20/2019 Today's weight: 250 lbs Today's date: 09/15/2022 Total lbs lost to date: 0 Total lbs lost since last in-office visit: 0  Interim History: Patient has been mindful of his eating and continues to exercise.  His work schedule has been more hectic and his wife is recovering from surgery, and he has not been as mindful as usual.  He is ready to get back on track.  Subjective:   1. Pre-diabetes Patient is working on his diet and exercise, but he has struggled more this last month.  His most recent A1c was 5.7.  Assessment/Plan:   1. Pre-diabetes Patient will continue metformin, and we will plan to recheck fasting labs next month.  He will work on decreasing simple carbohydrates and increase walking.  2. BMI 34.0-34.9,adult  3. Obesity, Beginning BMI 36.62 Stephen Oconnell is currently in the action stage of change. As such, his goal is to continue with weight loss efforts. He has agreed to following a lower carbohydrate, vegetable and lean protein rich diet plan for 2 weeks, then change to the Category 3 Plan.   Exercise goals: As is.   Behavioral modification strategies: increasing lean protein intake and meal planning and cooking strategies.  Stephen Oconnell has agreed to follow-up with our clinic in 4 weeks. He was informed of the importance of frequent follow-up visits to maximize his success with intensive lifestyle modifications for his multiple health conditions.   Objective:   Blood pressure 121/64, pulse 69, temperature (!) 97.4  F (36.3 C), height 5\' 9"  (1.753 m), weight 230 lb (104.3 kg), SpO2 96 %. Body mass index is 33.97 kg/m.  Lab Results  Component Value Date   CREATININE 1.16 03/19/2021   BUN 24 03/19/2021   NA 140 03/19/2021   K 4.8 03/19/2021   CL 98 03/19/2021   CO2 28 03/19/2021   Lab Results  Component Value Date   ALT 20 03/19/2021   AST 17 03/19/2021   ALKPHOS 56 03/19/2021   BILITOT 0.6 03/19/2021   Lab Results  Component Value Date   HGBA1C 5.7 (H) 04/20/2022   HGBA1C 5.5 03/19/2021   HGBA1C 5.5 09/18/2020   HGBA1C 5.6 04/08/2020   HGBA1C 6.0 (H) 12/20/2019   Lab Results  Component Value Date   INSULIN 15.7 04/20/2022   INSULIN 15.1 03/19/2021   INSULIN 17.4 09/18/2020   INSULIN 17.2 04/08/2020   INSULIN 29.5 (H) 12/20/2019   Lab Results  Component Value Date   TSH 2.450 12/20/2019   Lab Results  Component Value Date   CHOL 90 (L) 04/20/2022   HDL 47 04/20/2022   LDLCALC 29 04/20/2022   TRIG 60 04/20/2022   Lab Results  Component Value Date   VD25OH 47.0 04/20/2022   VD25OH 51.2 03/19/2021   VD25OH 91.9 09/18/2020   Lab Results  Component Value Date   WBC 6.0 12/20/2019   HGB 16.6 12/20/2019   HCT 49.4 12/20/2019   MCV 96 12/20/2019   PLT 209 12/20/2019   No results found for: "IRON", "TIBC", "  FERRITIN"  Attestation Statements:   Reviewed by clinician on day of visit: allergies, medications, problem list, medical history, surgical history, family history, social history, and previous encounter notes.  Time spent on visit including pre-visit chart review and post-visit care and charting was 31 minutes.   I, Burt Knack, am acting as transcriptionist for Quillian Quince, MD.  I have reviewed the above documentation for accuracy and completeness, and I agree with the above. -  Quillian Quince, MD

## 2022-10-21 ENCOUNTER — Encounter (INDEPENDENT_AMBULATORY_CARE_PROVIDER_SITE_OTHER): Payer: Self-pay | Admitting: Family Medicine

## 2022-10-21 ENCOUNTER — Ambulatory Visit (INDEPENDENT_AMBULATORY_CARE_PROVIDER_SITE_OTHER): Payer: Medicare Other | Admitting: Family Medicine

## 2022-10-21 VITALS — BP 116/61 | HR 76 | Temp 97.6°F | Ht 69.0 in | Wt 222.0 lb

## 2022-10-21 DIAGNOSIS — R7303 Prediabetes: Secondary | ICD-10-CM | POA: Diagnosis not present

## 2022-10-21 DIAGNOSIS — E669 Obesity, unspecified: Secondary | ICD-10-CM

## 2022-10-21 DIAGNOSIS — E7849 Other hyperlipidemia: Secondary | ICD-10-CM

## 2022-10-21 DIAGNOSIS — Z6832 Body mass index (BMI) 32.0-32.9, adult: Secondary | ICD-10-CM

## 2022-10-21 DIAGNOSIS — E559 Vitamin D deficiency, unspecified: Secondary | ICD-10-CM

## 2022-10-21 DIAGNOSIS — I10 Essential (primary) hypertension: Secondary | ICD-10-CM | POA: Diagnosis not present

## 2022-10-21 DIAGNOSIS — F5089 Other specified eating disorder: Secondary | ICD-10-CM

## 2022-10-21 MED ORDER — BUPROPION HCL ER (SR) 150 MG PO TB12: 150.0000 mg | ORAL_TABLET | Freq: Every day | ORAL | 0 refills | Status: DC

## 2022-10-21 MED ORDER — METFORMIN HCL 500 MG PO TABS: 500.0000 mg | ORAL_TABLET | Freq: Two times a day (BID) | ORAL | 0 refills | Status: DC

## 2022-10-22 LAB — LIPID PANEL WITH LDL/HDL RATIO
Cholesterol, Total: 109 mg/dL (ref 100–199)
HDL: 54 mg/dL (ref >39)
LDL Chol Calc (NIH): 40 mg/dL (ref 0–99)
LDL/HDL Ratio: 0.7 ratio (ref 0.0–3.6)
Triglycerides: 71 mg/dL (ref 0–149)
VLDL Cholesterol Cal: 15 mg/dL (ref 5–40)

## 2022-10-22 LAB — CMP14+EGFR
ALT: 21 IU/L (ref 0–44)
AST: 20 IU/L (ref 0–40)
Albumin: 4.5 g/dL (ref 3.8–4.8)
Alkaline Phosphatase: 63 IU/L (ref 44–121)
BUN/Creatinine Ratio: 17 (ref 10–24)
BUN: 21 mg/dL (ref 8–27)
Bilirubin Total: 0.5 mg/dL (ref 0.0–1.2)
CO2: 27 mmol/L (ref 20–29)
Calcium: 9.5 mg/dL (ref 8.6–10.2)
Chloride: 98 mmol/L (ref 96–106)
Creatinine, Ser: 1.24 mg/dL (ref 0.76–1.27)
Globulin, Total: 2.8 g/dL (ref 1.5–4.5)
Glucose: 109 mg/dL — ABNORMAL HIGH (ref 70–99)
Potassium: 4.7 mmol/L (ref 3.5–5.2)
Sodium: 141 mmol/L (ref 134–144)
Total Protein: 7.3 g/dL (ref 6.0–8.5)
eGFR: 61 mL/min/{1.73_m2} (ref >59)

## 2022-10-22 LAB — HEMOGLOBIN A1C
Est. average glucose Bld gHb Est-mCnc: 120 mg/dL
Hgb A1c MFr Bld: 5.8 % — ABNORMAL HIGH (ref 4.8–5.6)

## 2022-10-22 LAB — VITAMIN B12: Vitamin B-12: 482 pg/mL (ref 232–1245)

## 2022-10-22 LAB — INSULIN, RANDOM: INSULIN: 17.2 u[IU]/mL (ref 2.6–24.9)

## 2022-10-22 LAB — VITAMIN D 25 HYDROXY (VIT D DEFICIENCY, FRACTURES): Vit D, 25-Hydroxy: 45.9 ng/mL (ref 30.0–100.0)

## 2022-10-26 NOTE — Progress Notes (Signed)
Chief Complaint:   OBESITY Stephen Oconnell is here to discuss his progress with his obesity treatment plan along with follow-up of his obesity related diagnoses. Stephen Oconnell is on the Category 3 Plan and following a lower carbohydrate, vegetable and lean protein rich diet plan and states he is following his eating plan approximately 60% of the time. Stephen Oconnell states he is walking for 50-60 minutes 3-4 times per week.  Today's visit was #: 47 Starting weight: 248 lbs Starting date: 12/20/2019 Today's weight: 222 lbs Today's date: 10/21/2022 Total lbs lost to date: 26 Total lbs lost since last in-office visit: 8  Interim History: Patient has done very well with his weight loss on his category 3 plan.  His hunger is mostly controlled and he has been walking on days with better weather.  Subjective:   1. Pre-diabetes Patient is on metformin, and he is due for labs.  He denies nausea or vomiting.  He is doing very well with his diet.  2. Essential hypertension Patient's blood pressure is well-controlled on losartan.  He has questions about his medication and how it is helping him.  3. Other hyperlipidemia Patient is on Crestor, and his levels have been well-controlled.  He is doing well with decreasing cholesterol in his diet.  He would like to see if he could stop this medication.  4. Vitamin D deficiency Patient is stable on vitamin D, and he is due for labs.  5. Emotional Eating Behavior Patient is doing well with decreasing emotional eating behavior.  No side effects were noted, and his blood pressure is stable.  Assessment/Plan:   1. Pre-diabetes We will check labs today.  Patient will continue with his diet and metformin, and we will refill metformin for 90 days.  - metFORMIN (GLUCOPHAGE) 500 MG tablet; Take 1 tablet (500 mg total) by mouth 2 (two) times daily with a meal.  Dispense: 180 tablet; Refill: 0 - CMP14+EGFR - Vitamin B12 - Lipid Panel With LDL/HDL Ratio - Insulin, random -  Hemoglobin A1c  2. Essential hypertension All of the patient's questions were answered.  We will check labs today.  He will continue with his diet and we will follow-up at his next visit.  3. Other hyperlipidemia We will check labs today.  Patient will continue with his diet, and he was advised to discuss this with his Cardiologist at his next visit.  4. Vitamin D deficiency We will check labs today, and we will follow-up at patient's next visit.  - VITAMIN D 25 Hydroxy (Vit-D Deficiency, Fractures)  5. Emotional Eating Behavior Patient will continue Wellbutrin SR, and we will refill for 90 days.  - buPROPion (WELLBUTRIN SR) 150 MG 12 hr tablet; Take 1 tablet (150 mg total) by mouth daily.  Dispense: 90 tablet; Refill: 0  6. BMI 32.0-32.9,adult  7. Obesity, Beginning BMI 36.62 Stephen Oconnell is currently in the action stage of change. As such, his goal is to continue with weight loss efforts. He has agreed to the Category 3 Plan.   Exercise goals: As is.   Behavioral modification strategies: increasing lean protein intake and increasing water intake.  Stephen Oconnell has agreed to follow-up with our clinic in 4 weeks. He was informed of the importance of frequent follow-up visits to maximize his success with intensive lifestyle modifications for his multiple health conditions.   Stephen Oconnell was informed we would discuss his lab results at his next visit unless there is a critical issue that needs to be addressed sooner. [name] agreed  to keep his next visit at the agreed upon time to discuss these results.  Objective:   Blood pressure 116/61, pulse 76, temperature 97.6 F (36.4 C), height 5\' 9"  (1.753 m), weight 222 lb (100.7 kg), SpO2 97%. Body mass index is 32.78 kg/m.  Lab Results  Component Value Date   CREATININE 1.24 10/21/2022   BUN 21 10/21/2022   NA 141 10/21/2022   K 4.7 10/21/2022   CL 98 10/21/2022   CO2 27 10/21/2022   Lab Results  Component Value Date   ALT 21 10/21/2022   AST 20  10/21/2022   ALKPHOS 63 10/21/2022   BILITOT 0.5 10/21/2022   Lab Results  Component Value Date   HGBA1C 5.8 (H) 10/21/2022   HGBA1C 5.7 (H) 04/20/2022   HGBA1C 5.5 03/19/2021   HGBA1C 5.5 09/18/2020   HGBA1C 5.6 04/08/2020   Lab Results  Component Value Date   INSULIN 17.2 10/21/2022   INSULIN 15.7 04/20/2022   INSULIN 15.1 03/19/2021   INSULIN 17.4 09/18/2020   INSULIN 17.2 04/08/2020   Lab Results  Component Value Date   TSH 2.450 12/20/2019   Lab Results  Component Value Date   CHOL 109 10/21/2022   HDL 54 10/21/2022   LDLCALC 40 10/21/2022   TRIG 71 10/21/2022   Lab Results  Component Value Date   VD25OH 45.9 10/21/2022   VD25OH 47.0 04/20/2022   VD25OH 51.2 03/19/2021   Lab Results  Component Value Date   WBC 6.0 12/20/2019   HGB 16.6 12/20/2019   HCT 49.4 12/20/2019   MCV 96 12/20/2019   PLT 209 12/20/2019   No results found for: "IRON", "TIBC", "FERRITIN"  Attestation Statements:   Reviewed by clinician on day of visit: allergies, medications, problem list, medical history, surgical history, family history, social history, and previous encounter notes.   I, Burt Knack, am acting as transcriptionist for Quillian Quince, MD.  I have reviewed the above documentation for accuracy and completeness, and I agree with the above. -  Quillian Quince, MD

## 2022-11-25 ENCOUNTER — Ambulatory Visit (INDEPENDENT_AMBULATORY_CARE_PROVIDER_SITE_OTHER): Payer: Medicare Other | Admitting: Family Medicine

## 2022-11-25 ENCOUNTER — Encounter (INDEPENDENT_AMBULATORY_CARE_PROVIDER_SITE_OTHER): Payer: Self-pay | Admitting: Family Medicine

## 2022-11-25 VITALS — BP 120/58 | HR 69 | Temp 97.5°F | Ht 69.0 in | Wt 227.0 lb

## 2022-11-25 DIAGNOSIS — I10 Essential (primary) hypertension: Secondary | ICD-10-CM | POA: Diagnosis not present

## 2022-11-25 DIAGNOSIS — E669 Obesity, unspecified: Secondary | ICD-10-CM

## 2022-11-25 DIAGNOSIS — R7303 Prediabetes: Secondary | ICD-10-CM

## 2022-11-25 DIAGNOSIS — R400 Somnolence: Secondary | ICD-10-CM | POA: Diagnosis not present

## 2022-11-25 DIAGNOSIS — Z6833 Body mass index (BMI) 33.0-33.9, adult: Secondary | ICD-10-CM

## 2022-11-25 NOTE — Progress Notes (Signed)
.smr  Office: [phone]  /  Fax: [phone]  WEIGHT SUMMARY AND BIOMETRICS  Anthropometric Measurements Height: 5\' 9"  (1.753 m) Weight: 227 lb (103 kg) BMI (Calculated): 33.51 Weight at Last Visit: 222 lb Weight Lost Since Last Visit: 0 Weight Gained Since Last Visit: 5 lb Starting Weight: 248 lb Total Weight Loss (lbs): 21 lb (9.526 kg)   Body Composition  Body Fat %: 32 % Fat Mass (lbs): 72.8 lbs Muscle Mass (lbs): 147.4 lbs Total Body Water (lbs): 107.2 lbs Visceral Fat Rating : 21   Other Clinical Data Fasting: Yes Labs: No Today's Visit #: 48 Starting Date: 12/20/19    Chief Complaint: OBESITY   History of Present Illness   The patient, with a history of prediabetes and obesity, presents for a follow-up visit. He has been on Metformin 500 mg twice daily for prediabetes management and has been making dietary and exercise modifications to prevent the progression to diabetes. Despite these efforts, he has gained five pounds since the last visit a month ago. He reports adherence to the category three diet plan about 75% of the time and has been engaging in walking exercises for 30-40 minutes, three times per week.  The patient acknowledges over-snacking as a challenge, despite efforts to limit the availability of snacks at home. He has been consuming roasted almonds as a snack, which he believes may be contributing to his weight gain. He expresses a willingness to explore strategies to minimize snacking and improve his diet.  The patient also reports daytime somnolence, particularly when sitting quietly for about 30 minutes. He has been using a CPAP machine for sleep apnea and reports good sleep quality at night. He has not noticed any correlation between his walking exercise and his energy levels the following day.  In addition to Metformin, the patient is on Triamterene Hydrochlorothiazide for blood pressure management, which has been stable at 120/58. He reports  increased urination and suspects that the medication may be contributing to his headaches. He has recently started keeping bottles of water in his office to improve hydration.  The patient's recent labs show a slight increase in his hemoglobin A1c, indicating a potential worsening of his prediabetes. He reports good adherence to his Metformin regimen. He expresses a desire to prevent the progression to diabetes and is open to increasing his Metformin dose if necessary.          PHYSICAL EXAM:  Blood pressure (!) 120/58, pulse 69, temperature (!) 97.5 F (36.4 C), height 5\' 9"  (1.753 m), weight 227 lb (103 kg), SpO2 96%. Body mass index is 33.52 kg/m.  DIAGNOSTIC DATA REVIEWED:  BMET    Component Value Date/Time   NA 141 10/21/2022 0832   K 4.7 10/21/2022 0832   CL 98 10/21/2022 0832   CO2 27 10/21/2022 0832   GLUCOSE 109 (H) 10/21/2022 0832   BUN 21 10/21/2022 0832   CREATININE 1.24 10/21/2022 0832   CALCIUM 9.5 10/21/2022 0832   GFRNONAA 57 (L) 04/08/2020 0801   GFRAA 65 04/08/2020 0801   Lab Results  Component Value Date   HGBA1C 5.8 (H) 10/21/2022   HGBA1C 6.0 (H) 12/20/2019   Lab Results  Component Value Date   INSULIN 17.2 10/21/2022   INSULIN 29.5 (H) 12/20/2019   Lab Results  Component Value Date   TSH 2.450 12/20/2019   CBC    Component Value Date/Time   WBC 6.0 12/20/2019 0832   RBC 5.15 12/20/2019 0832   HGB 16.6 12/20/2019 0832  HCT 49.4 12/20/2019 0832   PLT 209 12/20/2019 0832   MCV 96 12/20/2019 0832   MCH 32.2 12/20/2019 0832   MCHC 33.6 12/20/2019 0832   RDW 12.8 12/20/2019 0832   Iron Studies No results found for: "IRON", "TIBC", "FERRITIN", "IRONPCTSAT" Lipid Panel     Component Value Date/Time   CHOL 109 10/21/2022 0832   TRIG 71 10/21/2022 0832   HDL 54 10/21/2022 0832   LDLCALC 40 10/21/2022 0832   Hepatic Function Panel     Component Value Date/Time   PROT 7.3 10/21/2022 0832   ALBUMIN 4.5 10/21/2022 0832   AST 20  10/21/2022 0832   ALT 21 10/21/2022 0832   ALKPHOS 63 10/21/2022 0832   BILITOT 0.5 10/21/2022 0832      Component Value Date/Time   TSH 2.450 12/20/2019 0832   Nutritional Lab Results  Component Value Date   VD25OH 45.9 10/21/2022   VD25OH 47.0 04/20/2022   VD25OH 51.2 03/19/2021     Assessment and Plan    Obesity Weight gain of 5 pounds over the past month, despite adherence to category three diet plan and regular walking exercise. Discussed strategies to manage snacking and portion control. -Continue category three diet plan and regular walking exercise. -Implement portion control strategies, including pre-packaging snacks into 100-calorie portions. -Consider incorporating more hills into walking routine for increased metabolic benefit.  Prediabetes Hemoglobin A1c levels have been creeping up despite adherence to metformin 500mg  twice daily. Discussed the importance of not only monitoring carbohydrate intake, but also overall caloric intake. -Continue metformin 500mg  twice daily. -Check Hemoglobin A1c levels again before Christmas. -If levels are not improving, consider increasing metformin dosage.  Hypertension Stable on Maxzide, blood pressure at 120/58 today. Patient reports possible side effects of increased urination and headaches, potentially due to dehydration. -Continue Maxzide. -Encourage increased water intake to prevent dehydration. -Monitor for changes in headache frequency or severity.  Daytime Somnolence Patient reports feeling sleepy when sitting for extended periods. Discussed potential causes, including poor quality sleep despite use of CPAP. -Monitor for changes in sleepiness levels. -Consider evaluation of CPAP settings if sleepiness persists or worsens. -Continue to work on exercise earlier in the day to help with better quality sleep  Follow-up in 4 weeks.       He was informed of the importance of frequent follow up visits to maximize his  success with intensive lifestyle modifications for his multiple health conditions.    Quillian Quince, MD

## 2022-12-23 ENCOUNTER — Encounter (INDEPENDENT_AMBULATORY_CARE_PROVIDER_SITE_OTHER): Payer: Self-pay | Admitting: Family Medicine

## 2022-12-23 ENCOUNTER — Ambulatory Visit (INDEPENDENT_AMBULATORY_CARE_PROVIDER_SITE_OTHER): Payer: Medicare Other | Admitting: Family Medicine

## 2022-12-23 VITALS — BP 138/62 | HR 66 | Temp 97.6°F | Ht 69.0 in | Wt 229.0 lb

## 2022-12-23 DIAGNOSIS — Z6833 Body mass index (BMI) 33.0-33.9, adult: Secondary | ICD-10-CM

## 2022-12-23 DIAGNOSIS — R7303 Prediabetes: Secondary | ICD-10-CM

## 2022-12-23 DIAGNOSIS — F5089 Other specified eating disorder: Secondary | ICD-10-CM | POA: Diagnosis not present

## 2022-12-23 DIAGNOSIS — E669 Obesity, unspecified: Secondary | ICD-10-CM

## 2022-12-23 DIAGNOSIS — I10 Essential (primary) hypertension: Secondary | ICD-10-CM | POA: Diagnosis not present

## 2022-12-23 NOTE — Progress Notes (Signed)
.smr  Office: [phone]  /  Fax: [phone]  WEIGHT SUMMARY AND BIOMETRICS  Anthropometric Measurements Height: 5\' 9"  (1.753 m) Weight: 229 lb (103.9 kg) BMI (Calculated): 33.8 Weight at Last Visit: 227 lb Weight Lost Since Last Visit: 0 Weight Gained Since Last Visit: 2 lb Starting Weight: 248 lb Total Weight Loss (lbs): 19 lb (8.618 kg)   Body Composition  Body Fat %: 32.3 % Fat Mass (lbs): 74.2 lbs Muscle Mass (lbs): 148 lbs Total Body Water (lbs): 108.4 lbs Visceral Fat Rating : 21   Other Clinical Data Fasting: Yes Labs: No Today's Visit #: 26 Starting Date: 12/20/19    Chief Complaint: OBESITY   History of Present Illness   The patient, with a history of obesity, depression, emotional eating behaviors, and hypertension, presents with a weight gain of two pounds over the last month. He reports adherence to his category three plan approximately 70% of the time and engages in walking for exercise 40-45 minutes, five times per week. He has been taking Wellbutrin 150mg  daily to manage emotional eating behaviors, and Losartan 50mg  and Maxzide 25mg  daily for hypertension.  The patient acknowledges an increase in snacking, which he attributes not to hunger but to availability and possibly stress. He expresses dissatisfaction with the recent weight gain and identifies snacking as a significant challenge. Sleep patterns are reported as normal.  The patient is also on Metformin for prediabetes and reports no issues with his current medication regimen.    Blood pressure at the time of the visit was slightly elevated compared to his usual readings, but no contributing factors were identified.          PHYSICAL EXAM:  Blood pressure 138/62, pulse 66, temperature 97.6 F (36.4 C), height 5\' 9"  (1.753 m), weight 229 lb (103.9 kg), SpO2 95%. Body mass index is 33.82 kg/m.  DIAGNOSTIC DATA REVIEWED:  BMET    Component Value Date/Time   NA 141 10/21/2022 0832   K  4.7 10/21/2022 0832   CL 98 10/21/2022 0832   CO2 27 10/21/2022 0832   GLUCOSE 109 (H) 10/21/2022 0832   BUN 21 10/21/2022 0832   CREATININE 1.24 10/21/2022 0832   CALCIUM 9.5 10/21/2022 0832   GFRNONAA 57 (L) 04/08/2020 0801   GFRAA 65 04/08/2020 0801   Lab Results  Component Value Date   HGBA1C 5.8 (H) 10/21/2022   HGBA1C 6.0 (H) 12/20/2019   Lab Results  Component Value Date   INSULIN 17.2 10/21/2022   INSULIN 29.5 (H) 12/20/2019   Lab Results  Component Value Date   TSH 2.450 12/20/2019   CBC    Component Value Date/Time   WBC 6.0 12/20/2019 0832   RBC 5.15 12/20/2019 0832   HGB 16.6 12/20/2019 0832   HCT 49.4 12/20/2019 0832   PLT 209 12/20/2019 0832   MCV 96 12/20/2019 0832   MCH 32.2 12/20/2019 0832   MCHC 33.6 12/20/2019 0832   RDW 12.8 12/20/2019 0832   Iron Studies No results found for: "IRON", "TIBC", "FERRITIN", "IRONPCTSAT" Lipid Panel     Component Value Date/Time   CHOL 109 10/21/2022 0832   TRIG 71 10/21/2022 0832   HDL 54 10/21/2022 0832   LDLCALC 40 10/21/2022 0832   Hepatic Function Panel     Component Value Date/Time   PROT 7.3 10/21/2022 0832   ALBUMIN 4.5 10/21/2022 0832   AST 20 10/21/2022 0832   ALT 21 10/21/2022 0832   ALKPHOS 63 10/21/2022 0832   BILITOT 0.5 10/21/2022 9528  Component Value Date/Time   TSH 2.450 12/20/2019 0832   Nutritional Lab Results  Component Value Date   VD25OH 45.9 10/21/2022   VD25OH 47.0 04/20/2022   VD25OH 51.2 03/19/2021     Assessment and Plan    Obesity with Emotional Eating Behaviors Weight gain of 2 pounds over the past month. Emotional eating and snacking identified as contributing factors. Currently on Wellbutrin 150mg  daily for emotional eating behaviors. -Continue Wellbutrin 150mg  daily. -Implement strategy to reduce snacking, specifically eliminating nightly popcorn consumption. -Consider adding ankle weights to walking routine for increased exercise  intensity.  Hypertension Blood pressure slightly elevated at 138/62. Currently on Losartan 50mg  daily and Maxzide 25mg  daily. -Continue Losartan 50mg  daily and Maxzide 25mg  daily. -Monitor blood pressure for potential trend of elevation. -Continue diet and exercise to help control HTN  Prediabetes Currently managed with Metformin. -Continue Metformin as prescribed. -Maintain diet and exercise regimen to prevent onset of diabetes.  Follow-up appointment scheduled for January 20, 2023.       I have personally spent 20 minutes total time today in preparation, patient care, and documentation for this visit, including the following: review of clinical lab tests; review of medical tests/procedures/services.    He was informed of the importance of frequent follow up visits to maximize his success with intensive lifestyle modifications for his multiple health conditions.    Quillian Quince, MD

## 2023-01-20 ENCOUNTER — Encounter (INDEPENDENT_AMBULATORY_CARE_PROVIDER_SITE_OTHER): Payer: Self-pay | Admitting: Family Medicine

## 2023-01-20 ENCOUNTER — Ambulatory Visit (INDEPENDENT_AMBULATORY_CARE_PROVIDER_SITE_OTHER): Payer: Medicare Other | Admitting: Family Medicine

## 2023-01-20 VITALS — BP 123/55 | HR 70 | Temp 97.5°F | Ht 69.0 in | Wt 228.0 lb

## 2023-01-20 DIAGNOSIS — R7303 Prediabetes: Secondary | ICD-10-CM

## 2023-01-20 DIAGNOSIS — Z6833 Body mass index (BMI) 33.0-33.9, adult: Secondary | ICD-10-CM

## 2023-01-20 DIAGNOSIS — F5089 Other specified eating disorder: Secondary | ICD-10-CM | POA: Diagnosis not present

## 2023-01-20 DIAGNOSIS — E669 Obesity, unspecified: Secondary | ICD-10-CM | POA: Diagnosis not present

## 2023-01-20 MED ORDER — METFORMIN HCL 500 MG PO TABS: 500.0000 mg | ORAL_TABLET | Freq: Two times a day (BID) | ORAL | 0 refills | Status: DC

## 2023-01-20 MED ORDER — BUPROPION HCL ER (SR) 150 MG PO TB12: 150.0000 mg | ORAL_TABLET | Freq: Every day | ORAL | 0 refills | Status: DC

## 2023-01-20 NOTE — Progress Notes (Signed)
.smr  Office: [phone]  /  Fax: [phone]  WEIGHT SUMMARY AND BIOMETRICS  Anthropometric Measurements Height: 5\' 9"  (1.753 m) Weight: 228 lb (103.4 kg) BMI (Calculated): 33.65 Weight at Last Visit: 229 lb Weight Lost Since Last Visit: 1 lb Weight Gained Since Last Visit: 0 Starting Weight: 248 lb Total Weight Loss (lbs): 20 lb (9.072 kg)   Body Composition  Body Fat %: 31 % Fat Mass (lbs): 70.8 lbs Muscle Mass (lbs): 149.6 lbs Total Body Water (lbs): 107.2 lbs Visceral Fat Rating : 20   Other Clinical Data Fasting: Yes Today's Visit #: 50 Starting Date: 12/20/19    Chief Complaint: OBESITY   History of Present Illness   The patient, with a history of prediabetes, emotional eating behaviors, and obesity, has been on metformin and bupropion. He reports adherence to a category three eating plan about 75% of the time and has lost a pound in the last month. He has been engaging in regular physical activity, walking for 45 to 60 minutes four times per week.  The patient denies any new health issues. He reports no significant deviation from his diet during the recent Halloween season. He has been managing his hydration, although he acknowledges room for improvement.  Regarding sleep, the patient reports an average of seven hours per night but does not always wake up feeling refreshed. He notes it usually takes about twenty minutes to feel fully awake. During allergy seasons, he expresses a desire to return to bed after waking up.  The patient has been following his category three diet for about two years and feels it is going fairly well. He does not report significant boredom with the diet. He has not expressed any desire to change his current regimen.  The patient's blood pressure has been well controlled, with no reported symptoms of lightheadedness or dizziness. He has not reported any symptoms of insomnia, which can sometimes be a side effect of bupropion.           PHYSICAL EXAM:  Blood pressure (!) 123/55, pulse 70, temperature (!) 97.5 F (36.4 C), height 5\' 9"  (1.753 m), weight 228 lb (103.4 kg), SpO2 97%. Body mass index is 33.67 kg/m.  DIAGNOSTIC DATA REVIEWED:  BMET    Component Value Date/Time   NA 141 10/21/2022 0832   K 4.7 10/21/2022 0832   CL 98 10/21/2022 0832   CO2 27 10/21/2022 0832   GLUCOSE 109 (H) 10/21/2022 0832   BUN 21 10/21/2022 0832   CREATININE 1.24 10/21/2022 0832   CALCIUM 9.5 10/21/2022 0832   GFRNONAA 57 (L) 04/08/2020 0801   GFRAA 65 04/08/2020 0801   Lab Results  Component Value Date   HGBA1C 5.8 (H) 10/21/2022   HGBA1C 6.0 (H) 12/20/2019   Lab Results  Component Value Date   INSULIN 17.2 10/21/2022   INSULIN 29.5 (H) 12/20/2019   Lab Results  Component Value Date   TSH 2.450 12/20/2019   CBC    Component Value Date/Time   WBC 6.0 12/20/2019 0832   RBC 5.15 12/20/2019 0832   HGB 16.6 12/20/2019 0832   HCT 49.4 12/20/2019 0832   PLT 209 12/20/2019 0832   MCV 96 12/20/2019 0832   MCH 32.2 12/20/2019 0832   MCHC 33.6 12/20/2019 0832   RDW 12.8 12/20/2019 0832   Iron Studies No results found for: "IRON", "TIBC", "FERRITIN", "IRONPCTSAT" Lipid Panel     Component Value Date/Time   CHOL 109 10/21/2022 0832   TRIG 71 10/21/2022 0832  HDL 54 10/21/2022 0832   LDLCALC 40 10/21/2022 0832   Hepatic Function Panel     Component Value Date/Time   PROT 7.3 10/21/2022 0832   ALBUMIN 4.5 10/21/2022 0832   AST 20 10/21/2022 0832   ALT 21 10/21/2022 0832   ALKPHOS 63 10/21/2022 0832   BILITOT 0.5 10/21/2022 0832      Component Value Date/Time   TSH 2.450 12/20/2019 0832   Nutritional Lab Results  Component Value Date   VD25OH 45.9 10/21/2022   VD25OH 47.0 04/20/2022   VD25OH 51.2 03/19/2021     Assessment and Plan    Prediabetes On metformin and reducing simple carbohydrates to control glucose levels. Last labs in August; upcoming labs to be checked at next visit. B12 levels had  slightly decreased; dietary intake of B12 will be monitored. Discussed importance of fasting for accurate lab results. - Continue metformin - Decrease simple carbohydrates - Order fasting labs at next visit - Check B12 levels  Obesity Lost one pound in the last month. Adheres to category three eating plan 75% of the time. Engages in walking for exercise 45-60 minutes, four times per week. Sleep is generally adequate but not always refreshing. Discussed importance of sleep for weight loss and overall health. - Continue category three eating plan - Continue walking 45-60 minutes, four times per week - Monitor sleep quality  Emotional Eating Behaviors On bupropion with no noted side effects. Blood pressure well controlled. No significant issues with sleep related to bupropion. Discussed importance of hydration to avoid symptoms of hypotension. - Continue bupropion - Increase hydration  General Health Maintenance Blood pressure well controlled. No symptoms of hypotension noted. Hydration needs improvement. Upcoming labs to include checks for kidneys, liver, electrolytes, and vitamin levels. - Increase hydration - Order comprehensive labs at next visit  Follow-up - Schedule January appointment - Ensure fasting for December 18th appointment.      He was informed of the importance of frequent follow up visits to maximize his success with intensive lifestyle modifications for his multiple health conditions.    Quillian Quince, MD

## 2023-02-23 ENCOUNTER — Ambulatory Visit (INDEPENDENT_AMBULATORY_CARE_PROVIDER_SITE_OTHER): Payer: Medicare Other | Admitting: Family Medicine

## 2023-03-13 NOTE — Progress Notes (Deleted)
  Cardiology Office Note:  .   Date:  03/13/2023  ID:  Stephen Oconnell, DOB 1946-01-28, MRN [id number] PCP: Larnell Hamilton, MD  [facility] Providers Cardiologist:  Darryle ONEIDA Decent, MD { Click to update primary MD,subspecialty MD or APP then REFRESH:1}   History of Present Illness: .   Stephen Oconnell is a 78 y.o. male with history of CAD, HLD who presents for follow-up.      Problem List 1. CAD -coronary calcium  score 138 (47th percentile) -T chol 96, HDL 50, LDL 34, TG 48 2. HTN 3. Obesity 4. OSA 5. RBBB    ROS: All other ROS reviewed and negative. Pertinent positives noted in the HPI.     Studies Reviewed: SABRA       Physical Exam:   VS:  There were no vitals taken for this visit.   Wt Readings from Last 3 Encounters:  01/20/23 228 lb (103.4 kg)  12/23/22 229 lb (103.9 kg)  11/25/22 227 lb (103 kg)    GEN: Well nourished, well developed in no acute distress NECK: No JVD; No carotid bruits CARDIAC: ***RRR, no murmurs, rubs, gallops RESPIRATORY:  Clear to auscultation without rales, wheezing or rhonchi  ABDOMEN: Soft, non-tender, non-distended EXTREMITIES:  No edema; No deformity  ASSESSMENT AND PLAN: .   ***    {Are you ordering a CV Procedure (e.g. stress test, cath, DCCV, TEE, etc)?   Press F2        :[id number]}   Follow-up: No follow-ups on file.  Time Spent with Patient: I have spent a total of *** minutes caring for this patient today face to face, ordering and reviewing labs/tests, reviewing prior records/medical history, examining the patient, establishing an assessment and plan, communicating results/findings to the patient/family, and documenting in the medical record.   Signed, Darryle ONEIDA. Decent, MD, [facility]  [facility]  [address] [place], [place] [address] [phone]  1:06 PM

## 2023-03-14 ENCOUNTER — Ambulatory Visit: Payer: Medicare Other | Admitting: Cardiovascular Disease

## 2023-03-14 NOTE — Progress Notes (Signed)
 Cardiology Office Note:  .   Date:  03/16/2023  ID:  Stephen Oconnell, DOB May 17, 1945, MRN [id number] PCP: Larnell Hamilton, MD  [facility] Providers Cardiologist:  Darryle ONEIDA Decent, MD { History of Present Illness: .   Stephen Oconnell is a 78 y.o. male with below history who presents for follow-up.   History of Present Illness   Mr. Stephen Oconnell, a 78 year old male with a history of elevated coronary calcium  score, presents for follow-up. The patient reports discontinuing Crestor  (rosuvastatin ) due to perceived side effects, including headaches, and questions its necessity given his current cholesterol levels. He also describes episodes of dizziness and spells that his wife describes as seizure-like. These episodes have been occurring for the past twenty years but have worsened in the past year. The patient reports that these episodes often occur when he is sitting for extended periods and are sometimes associated with migraines. He describes these migraines as more body migraines than headaches and occasionally experiences an aura. During these episodes, the patient feels like he is asleep but is aware of his surroundings. He denies any chest pain or trouble breathing during these episodes. The patient also reports taking metformin  and Wellbutrin , the latter of which he started a year ago. No CP or SOB. No palpitations. BP is well controlled.          Problem List 1. CAD -coronary calcium  score 138 (47th percentile) -T chol 109, HDL 54, LDL 40, TG 71 2. HTN 3. Obesity 4. OSA 5. RBBB    ROS: All other ROS reviewed and negative. Pertinent positives noted in the HPI.     Studies Reviewed: SABRA   EKG Interpretation Date/Time:  Wednesday March 16 2023 13:40:53 EST Ventricular Rate:  75 PR Interval:  180 QRS Duration:  140 QT Interval:  400 QTC Calculation: 446 R Axis:   12  Text Interpretation: Sinus rhythm with occasional Premature ventricular complexes Right bundle branch block  Confirmed by Decent Darryle [phone]) on 03/16/2023 1:48:10 PM   Physical Exam:   VS:  BP 122/70 (Cuff Size: Large)   Pulse 75   Ht 5' 9 (1.753 m)   Wt 243 lb 6.4 oz (110.4 kg)   SpO2 94%   BMI 35.94 kg/m    Wt Readings from Last 3 Encounters:  03/16/23 243 lb 6.4 oz (110.4 kg)  01/20/23 228 lb (103.4 kg)  12/23/22 229 lb (103.9 kg)    GEN: Well nourished, well developed in no acute distress NECK: No JVD; No carotid bruits CARDIAC: RRR, no murmurs, rubs, gallops RESPIRATORY:  Clear to auscultation without rales, wheezing or rhonchi  ABDOMEN: Soft, non-tender, non-distended EXTREMITIES:  No edema; No deformity  ASSESSMENT AND PLAN: .   Assessment and Plan    Coronary Artery Disease Elevated coronary calcium  score (138, 47th percentile for age). LDL cholesterol well controlled (40). Patient had discontinued Crestor  (Rosuvastatin ) 5mg  due to perceived side effects, but agreed to restart given the importance of maintaining low cholesterol levels in the context of his coronary calcium  score. -Restart Crestor  5mg  daily. -Continue Aspirin as previously prescribed.  Dizziness  Patient reports episodes of drowsiness and jerking movements, sometimes associated with migraines. Episodes have been ongoing for 20 years but have worsened over the past year, which coincides with the initiation of Wellbutrin . No overt syncope, chest pain, or trouble breathing reported. Episodes do not appear to be of cardiac etiology. -EKG unchanged today. No symptoms of CP or SOB. Do not sound cardiac. BP stable with these episodes.  -  Recommend adjustment of medications by primary care provider, specifically considering the potential side effects of Wellbutrin  or metformin . Symptoms worsened with these.  -If symptoms persist after medication adjustment, re-evaluate as needed.  Follow-up -Return visit in 1 year.              Follow-up: No follow-ups on file.  Time Spent with Patient: I have spent a total of  35 minutes caring for this patient today face to face, ordering and reviewing labs/tests, reviewing prior records/medical history, examining the patient, establishing an assessment and plan, communicating results/findings to the patient/family, and documenting in the medical record.   Signed, Darryle DASEN. Barbaraann, MD, [facility]  [facility]  [address] [place], [place] [address] [phone]  2:01 PM

## 2023-03-16 ENCOUNTER — Encounter: Payer: Self-pay | Admitting: Cardiovascular Disease

## 2023-03-16 ENCOUNTER — Ambulatory Visit: Payer: Medicare Other | Attending: Cardiovascular Disease | Admitting: Cardiovascular Disease

## 2023-03-16 VITALS — BP 122/70 | HR 75 | Ht 69.0 in | Wt 243.4 lb

## 2023-03-16 DIAGNOSIS — R42 Dizziness and giddiness: Secondary | ICD-10-CM

## 2023-03-16 DIAGNOSIS — R931 Abnormal findings on diagnostic imaging of heart and coronary circulation: Secondary | ICD-10-CM

## 2023-03-16 DIAGNOSIS — E782 Mixed hyperlipidemia: Secondary | ICD-10-CM | POA: Diagnosis not present

## 2023-03-16 DIAGNOSIS — I150 Renovascular hypertension: Secondary | ICD-10-CM

## 2023-03-16 MED ORDER — ROSUVASTATIN CALCIUM 5 MG PO TABS: 5.0000 mg | ORAL_TABLET | Freq: Every day | ORAL | 3 refills | Status: AC

## 2023-03-16 NOTE — Patient Instructions (Signed)
 Medication Instructions:  Crestor  5 mg daily *If you need a refill on your cardiac medications before your next appointment, please call your pharmacy*  Follow-Up: At [facility], you and your health needs are our priority.  As part of our continuing mission to provide you with exceptional heart care, we have created designated Provider Care Teams.  These Care Teams include your primary Cardiologist (physician) and Advanced Practice Providers (APPs -  Physician Assistants and Nurse Practitioners) who all work together to provide you with the care you need, when you need it.  We recommend signing up for the patient portal called MyChart.  Sign up information is provided on this After Visit Summary.  MyChart is used to connect with patients for Virtual Visits (Telemedicine).  Patients are able to view lab/test results, encounter notes, upcoming appointments, etc.  Non-urgent messages can be sent to your provider as well.   To learn more about what you can do with MyChart, go to [email or web address].    Your next appointment:   Any APP in one year

## 2023-03-23 ENCOUNTER — Ambulatory Visit (INDEPENDENT_AMBULATORY_CARE_PROVIDER_SITE_OTHER): Payer: Medicare Other | Admitting: Family Medicine

## 2023-03-23 ENCOUNTER — Encounter (INDEPENDENT_AMBULATORY_CARE_PROVIDER_SITE_OTHER): Payer: Self-pay | Admitting: Family Medicine

## 2023-03-23 VITALS — BP 123/66 | HR 74 | Temp 97.7°F | Ht 69.0 in | Wt 233.0 lb

## 2023-03-23 DIAGNOSIS — R400 Somnolence: Secondary | ICD-10-CM | POA: Insufficient documentation

## 2023-03-23 DIAGNOSIS — E785 Hyperlipidemia, unspecified: Secondary | ICD-10-CM

## 2023-03-23 DIAGNOSIS — I10 Essential (primary) hypertension: Secondary | ICD-10-CM

## 2023-03-23 DIAGNOSIS — R7303 Prediabetes: Secondary | ICD-10-CM | POA: Diagnosis not present

## 2023-03-23 DIAGNOSIS — E669 Obesity, unspecified: Secondary | ICD-10-CM

## 2023-03-23 DIAGNOSIS — Z6834 Body mass index (BMI) 34.0-34.9, adult: Secondary | ICD-10-CM

## 2023-03-23 DIAGNOSIS — E7849 Other hyperlipidemia: Secondary | ICD-10-CM

## 2023-03-23 DIAGNOSIS — E559 Vitamin D deficiency, unspecified: Secondary | ICD-10-CM

## 2023-03-23 NOTE — Progress Notes (Signed)
 .smr  Office: [phone]  /  Fax: [phone]  WEIGHT SUMMARY AND BIOMETRICS  Anthropometric Measurements Height: 5\' 9"  (1.753 m) Weight: 233 lb (105.7 kg) BMI (Calculated): 34.39 Weight at Last Visit: 228 lb Weight Lost Since Last Visit: 0 Weight Gained Since Last Visit: 5 lb Starting Weight: 248 lb Total Weight Loss (lbs): 15 lb (6.804 kg)   Body Composition  Body Fat %: 32.3 % Fat Mass (lbs): 75.4 lbs Muscle Mass (lbs): 150 lbs Total Body Water (lbs): 107.2 lbs Visceral Fat Rating : 21   Other Clinical Data Fasting: Yes Labs: Yes Today's Visit #: 88 Starting Date: 12/20/19    Chief Complaint: OBESITY   History of Present Illness   The patient, with a history of prediabetes, hypertension, hyperlipidemia, and vitamin D  deficiency, presents for a routine follow-up. He has been actively working on diet and exercise modifications to manage these conditions. Despite these efforts, the patient reports a weight gain of five pounds over the past two months, which included the holiday season. He admits to adhering to his prescribed dietary plan about 50% of the time and engages in walking exercises for 45 minutes three times a week.  The patient has noticed an increase in daytime somnolence, particularly when sedentary. He reports falling asleep during quiet activities such as reading or attending church services. This has been occurring for several months and has worsened recently. Upon waking, he experiences a startle reaction. Despite using a CPAP machine for sleep apnea and reporting good compliance, these episodes of excessive daytime sleepiness persist.  The patient also reports episodes of sudden sleep onset, described as "spells" by his spouse, during which he exhibits jerking motions. These episodes have been occurring for an extended period but have become more frequent and severe over the past three to six months. The patient has discussed these symptoms with his  cardiologist, who suggested that the patient's medication, Wellbutrin , might be a contributing factor. The patient attempted to discontinue the Wellbutrin  for a brief period, but the symptoms persisted and even seemed to worsen.  The patient has been considering the use of GLP-1 medications for weight loss, as he has hit a plateau in his weight loss efforts. He expresses a desire to think about this option further and discuss insurance coverage for these medications. The patient is also considering changing the timing of his Wellbutrin  dose to nighttime, in an attempt to manage his daytime somnolence.          PHYSICAL EXAM:  Blood pressure 123/66, pulse 74, temperature 97.7 F (36.5 C), height 5\' 9"  (1.753 m), weight 233 lb (105.7 kg), SpO2 97%. Body mass index is 34.41 kg/m.  DIAGNOSTIC DATA REVIEWED:  BMET    Component Value Date/Time   NA 141 10/21/2022 0832   K 4.7 10/21/2022 0832   CL 98 10/21/2022 0832   CO2 27 10/21/2022 0832   GLUCOSE 109 (H) 10/21/2022 0832   BUN 21 10/21/2022 0832   CREATININE 1.24 10/21/2022 0832   CALCIUM  9.5 10/21/2022 0832   GFRNONAA 57 (L) 04/08/2020 0801   GFRAA 65 04/08/2020 0801   Lab Results  Component Value Date   HGBA1C 5.8 (H) 10/21/2022   HGBA1C 6.0 (H) 12/20/2019   Lab Results  Component Value Date   INSULIN  17.2 10/21/2022   INSULIN  29.5 (H) 12/20/2019   Lab Results  Component Value Date   TSH 2.450 12/20/2019   CBC    Component Value Date/Time   WBC 6.0 12/20/2019 2956  RBC 5.15 12/20/2019 0832   HGB 16.6 12/20/2019 0832   HCT 49.4 12/20/2019 0832   PLT 209 12/20/2019 0832   MCV 96 12/20/2019 0832   MCH 32.2 12/20/2019 0832   MCHC 33.6 12/20/2019 0832   RDW 12.8 12/20/2019 0832   Iron Studies No results found for: "IRON", "TIBC", "FERRITIN", "IRONPCTSAT" Lipid Panel     Component Value Date/Time   CHOL 109 10/21/2022 0832   TRIG 71 10/21/2022 0832   HDL 54 10/21/2022 0832   LDLCALC 40 10/21/2022 0832    Hepatic Function Panel     Component Value Date/Time   PROT 7.3 10/21/2022 0832   ALBUMIN 4.5 10/21/2022 0832   AST 20 10/21/2022 0832   ALT 21 10/21/2022 0832   ALKPHOS 63 10/21/2022 0832   BILITOT 0.5 10/21/2022 0832      Component Value Date/Time   TSH 2.450 12/20/2019 0832   Nutritional Lab Results  Component Value Date   VD25OH 45.9 10/21/2022   VD25OH 47.0 04/20/2022   VD25OH 51.2 03/19/2021     Assessment and Plan    Daytime Somnolence Daytime somnolence with episodes of falling asleep during sedentary activities, accompanied by jerking motions. Differential includes medication side effects, iron deficiency, thyroid  dysfunction, or hypoglycemia. CPAP use reported with good compliance. - Order iron studies, thyroid  function tests, and fasting blood sugar - Change Wellbutrin  to bedtime - Reassess in 2 weeks  Prediabetes Prediabetes managed with metformin  and lifestyle modifications. Recent weight gain of 5 pounds likely due to holiday season. Discussed potential hypoglycemia; metformin  should not cause hypoglycemia but long intervals without eating could contribute. - Order fasting blood sugar and HbA1c - Continue metformin  - Encourage adherence to diet and exercise plan  Obesity Obesity with recent weight gain. Discussed potential use of GLP-1 agonists for weight management, including mechanism, benefits, risks (GI side effects, pancreatitis, rare thyroid  cancer), cost, and insurance coverage. Patient interested but hesitant, pending insurance approval. - Submit prior authorization for GLP-1 agonist (Zepbound) - Encourage dietary and exercise modifications  Hypertension Hypertension well-controlled with current medication regimen. Blood pressure today is 123/66 mmHg. - Continue Maxzide and Losartan - Monitor blood pressure regularly  Hyperlipidemia Hyperlipidemia managed with Crestor . Attempting to reduce dietary cholesterol. - Order lipid panel - Continue  Crestor  - Encourage dietary modifications to reduce cholesterol  Vitamin D  Deficiency Vitamin D  deficiency being monitored. - Order vitamin D  level  General Health Maintenance Needs to improve water intake and manage protein consumption to reduce snacking. - Encourage increased water intake, aiming for 20 ounces at lunch and in the afternoon - Encourage protein intake to reduce snacking  Follow-up - Schedule next appointment - Follow up on lab results and prior authorization within 2 weeks.         I have personally spent 40 minutes total time today in preparation, patient care, and documentation for this visit, including the following: review of clinical lab tests; review of medical tests/procedures/services.    He was informed of the importance of frequent follow up visits to maximize his success with intensive lifestyle modifications for his multiple health conditions.    Jasmine Mesi, MD

## 2023-03-24 LAB — CBC WITH DIFFERENTIAL/PLATELET
Basophils Absolute: 0 10*3/uL (ref 0.0–0.2)
Basos: 0 %
EOS (ABSOLUTE): 0.1 10*3/uL (ref 0.0–0.4)
Eos: 1 %
Hematocrit: 47.1 % (ref 37.5–51.0)
Hemoglobin: 16.1 g/dL (ref 13.0–17.7)
Immature Grans (Abs): 0 10*3/uL (ref 0.0–0.1)
Immature Granulocytes: 0 %
Lymphocytes Absolute: 1.4 10*3/uL (ref 0.7–3.1)
Lymphs: 25 %
MCH: 32.8 pg (ref 26.6–33.0)
MCHC: 34.2 g/dL (ref 31.5–35.7)
MCV: 96 fL (ref 79–97)
Monocytes Absolute: 0.9 10*3/uL (ref 0.1–0.9)
Monocytes: 16 %
Neutrophils Absolute: 3.3 10*3/uL (ref 1.4–7.0)
Neutrophils: 58 %
Platelets: 216 10*3/uL (ref 150–450)
RBC: 4.91 x10E6/uL (ref 4.14–5.80)
RDW: 13 % (ref 11.6–15.4)
WBC: 5.7 10*3/uL (ref 3.4–10.8)

## 2023-03-24 LAB — CMP14+EGFR
ALT: 20 [IU]/L (ref 0–44)
AST: 20 [IU]/L (ref 0–40)
Albumin: 4.5 g/dL (ref 3.8–4.8)
Alkaline Phosphatase: 58 [IU]/L (ref 44–121)
BUN/Creatinine Ratio: 19 (ref 10–24)
BUN: 21 mg/dL (ref 8–27)
Bilirubin Total: 0.5 mg/dL (ref 0.0–1.2)
CO2: 28 mmol/L (ref 20–29)
Calcium: 9.5 mg/dL (ref 8.6–10.2)
Chloride: 98 mmol/L (ref 96–106)
Creatinine, Ser: 1.13 mg/dL (ref 0.76–1.27)
Globulin, Total: 2.9 g/dL (ref 1.5–4.5)
Glucose: 122 mg/dL — ABNORMAL HIGH (ref 70–99)
Potassium: 4.2 mmol/L (ref 3.5–5.2)
Sodium: 142 mmol/L (ref 134–144)
Total Protein: 7.4 g/dL (ref 6.0–8.5)
eGFR: 68 mL/min/{1.73_m2} (ref >59)

## 2023-03-24 LAB — IRON AND TIBC
Iron Saturation: 40 % (ref 15–55)
Iron: 122 ug/dL (ref 38–169)
Total Iron Binding Capacity: 302 ug/dL (ref 250–450)
UIBC: 180 ug/dL (ref 111–343)

## 2023-03-24 LAB — LIPID PANEL WITH LDL/HDL RATIO
Cholesterol, Total: 114 mg/dL (ref 100–199)
HDL: 52 mg/dL (ref >39)
LDL Chol Calc (NIH): 47 mg/dL (ref 0–99)
LDL/HDL Ratio: 0.9 {ratio} (ref 0.0–3.6)
Triglycerides: 72 mg/dL (ref 0–149)
VLDL Cholesterol Cal: 15 mg/dL (ref 5–40)

## 2023-03-24 LAB — VITAMIN B12: Vitamin B-12: 335 pg/mL (ref 232–1245)

## 2023-03-24 LAB — HEMOGLOBIN A1C
Est. average glucose Bld gHb Est-mCnc: 114 mg/dL
Hgb A1c MFr Bld: 5.6 % (ref 4.8–5.6)

## 2023-03-24 LAB — FERRITIN: Ferritin: 196 ng/mL (ref 30–400)

## 2023-03-24 LAB — FOLATE: Folate: 6.3 ng/mL (ref >3.0)

## 2023-03-24 LAB — INSULIN, RANDOM: INSULIN: 23.5 u[IU]/mL (ref 2.6–24.9)

## 2023-03-24 LAB — TSH: TSH: 2.5 u[IU]/mL (ref 0.450–4.500)

## 2023-03-24 LAB — VITAMIN D 25 HYDROXY (VIT D DEFICIENCY, FRACTURES): Vit D, 25-Hydroxy: 45.2 ng/mL (ref 30.0–100.0)

## 2023-04-27 ENCOUNTER — Ambulatory Visit (INDEPENDENT_AMBULATORY_CARE_PROVIDER_SITE_OTHER): Payer: Medicare Other | Admitting: Family Medicine

## 2023-04-27 ENCOUNTER — Encounter (INDEPENDENT_AMBULATORY_CARE_PROVIDER_SITE_OTHER): Payer: Self-pay | Admitting: Family Medicine

## 2023-04-27 VITALS — BP 136/69 | HR 67 | Temp 97.9°F | Ht 69.0 in | Wt 232.0 lb

## 2023-04-27 DIAGNOSIS — E538 Deficiency of other specified B group vitamins: Secondary | ICD-10-CM

## 2023-04-27 DIAGNOSIS — F5089 Other specified eating disorder: Secondary | ICD-10-CM | POA: Diagnosis not present

## 2023-04-27 DIAGNOSIS — Z6834 Body mass index (BMI) 34.0-34.9, adult: Secondary | ICD-10-CM

## 2023-04-27 DIAGNOSIS — R7303 Prediabetes: Secondary | ICD-10-CM

## 2023-04-27 DIAGNOSIS — E669 Obesity, unspecified: Secondary | ICD-10-CM

## 2023-04-27 NOTE — Progress Notes (Signed)
 .smr  Office: [phone]  /  Fax: [phone]  WEIGHT SUMMARY AND BIOMETRICS  Anthropometric Measurements Height: 5\' 9"  (1.753 m) Weight: 232 lb (105.2 kg) BMI (Calculated): 34.24 Weight at Last Visit: 192 lb Weight Lost Since Last Visit: 1 lb Weight Gained Since Last Visit: 0 Starting Weight: 248 lb Total Weight Loss (lbs): 16 lb (7.258 kg)   Body Composition  Body Fat %: 32.7 % Fat Mass (lbs): 76 lbs Muscle Mass (lbs): 148.6 lbs Total Body Water (lbs): 108 lbs Visceral Fat Rating : 21   Other Clinical Data Fasting: no Labs: no Today's Visit #: 29 Starting Date: 12/20/19    Chief Complaint: OBESITY    History of Present Illness   Stephen Oconnell is a 78 year old male with obesity and prediabetes who presents for a follow-up on his treatment plan and lab results.  He is adhering to a category three eating plan approximately 70% of the time and has experienced a weight loss of one pound over the past four weeks. He engages in physical activity by walking for 60 minutes three to four times per week, primarily outdoors, but avoids walking in inclement weather. He experiences hunger levels that are manageable but tends to snack due to temptation, particularly on sweets prepared by his wife.  His prediabetes is showing improvement, with a decrease in hemoglobin A1c from 5.8 to 5.6. He is currently taking metformin 500 mg twice daily. His cholesterol levels are satisfactory, and renal and liver functions are within normal limits.  His vitamin D level remains stable at 45, close to the target. However, his B12 level has been gradually declining, now at 335, down from the 600s a year ago. He is not currently taking any multivitamin or B12 supplement but intends to start B12 1000 mcg daily from the drugstore.  He is on Wellbutrin SR 150 mg daily for emotional eating behaviors. His blood pressure is stable, and he takes losartan at night. No history of kidney stones.           PHYSICAL EXAM:  Blood pressure 136/69, pulse 67, temperature 97.9 F (36.6 C), height 5\' 9"  (1.753 m), weight 232 lb (105.2 kg), SpO2 98%. Body mass index is 34.26 kg/m.  DIAGNOSTIC DATA REVIEWED:  BMET    Component Value Date/Time   NA 142 03/23/2023 0843   K 4.2 03/23/2023 0843   CL 98 03/23/2023 0843   CO2 28 03/23/2023 0843   GLUCOSE 122 (H) 03/23/2023 0843   BUN 21 03/23/2023 0843   CREATININE 1.13 03/23/2023 0843   CALCIUM 9.5 03/23/2023 0843   GFRNONAA 57 (L) 04/08/2020 0801   GFRAA 65 04/08/2020 0801   Lab Results  Component Value Date   HGBA1C 5.6 03/23/2023   HGBA1C 6.0 (H) 12/20/2019   Lab Results  Component Value Date   INSULIN 23.5 03/23/2023   INSULIN 29.5 (H) 12/20/2019   Lab Results  Component Value Date   TSH 2.500 03/23/2023   CBC    Component Value Date/Time   WBC 5.7 03/23/2023 0843   RBC 4.91 03/23/2023 0843   HGB 16.1 03/23/2023 0843   HCT 47.1 03/23/2023 0843   PLT 216 03/23/2023 0843   MCV 96 03/23/2023 0843   MCH 32.8 03/23/2023 0843   MCHC 34.2 03/23/2023 0843   RDW 13.0 03/23/2023 0843   Iron Studies    Component Value Date/Time   IRON 122 03/23/2023 0843   TIBC 302 03/23/2023 0843   FERRITIN 196 03/23/2023 0843  IRONPCTSAT 40 03/23/2023 0843   Lipid Panel     Component Value Date/Time   CHOL 114 03/23/2023 0843   TRIG 72 03/23/2023 0843   HDL 52 03/23/2023 0843   LDLCALC 47 03/23/2023 0843   Hepatic Function Panel     Component Value Date/Time   PROT 7.4 03/23/2023 0843   ALBUMIN 4.5 03/23/2023 0843   AST 20 03/23/2023 0843   ALT 20 03/23/2023 0843   ALKPHOS 58 03/23/2023 0843   BILITOT 0.5 03/23/2023 0843      Component Value Date/Time   TSH 2.500 03/23/2023 0843   Nutritional Lab Results  Component Value Date   VD25OH 45.2 03/23/2023   VD25OH 45.9 10/21/2022   VD25OH 47.0 04/20/2022     Assessment and Plan    Obesity Following category three eating plan 70% of the time and walking 60 minutes  3-4 times per week. Lost one pound in the last four weeks. Challenges with temptation and emotional eating, particularly with sweets. Wife bakes frequently, contributing to temptation. Discussed potential use of weight loss medications, but current insurance does not cover them unless there is a documented history of myocardial infarction or stroke. Prefers to wait until April to see if insurance coverage changes. Discussed topiramate for emotional eating and cravings, but prefers to wait. Topiramate can cause taste disturbances, potential increase in kidney stones if dehydrated, and rare side effects like paresthesia and initial fatigue. - Continue current diet and exercise regimen Category 3  Emotional Eating On Wellbutrin SR 150 mg daily for emotional eating behaviors. Still snacking due to temptation rather than hunger. Discussed topiramate for emotional eating and cravings, but prefers to wait and see if insurance coverage for weight loss medications changes in April. - Continue Wellbutrin SR 150 mg daily - Refill Wellbutrin prescription  Prediabetes Hemoglobin A1c improved from 5.8% to 5.6%. Currently on metformin 500 mg BID. - Continue metformin 500 mg BID - Continue current diet and exercise regimen Category 3  Vitamin B12 Deficiency B12 level slowly dropping, now at 335 pg/mL, possibly due to metformin use. Importance of B12 for energy levels and erythropoiesis discussed. Prefers to take B12 supplements over the counter. - Start B12 1000 mcg daily OTC - Recheck B12 levels in 3 months  General Health Maintenance Cholesterol levels, renal and liver function, and electrolytes within normal limits. Vitamin D level stable at 45 ng/mL. - Continue current health maintenance regimen  Follow-up - Schedule follow-up appointment for one month after March 19th.          He was informed of the importance of frequent follow up visits to maximize his success with intensive lifestyle  modifications for his multiple health conditions.    Quillian Quince, MD

## 2023-04-28 ENCOUNTER — Other Ambulatory Visit (INDEPENDENT_AMBULATORY_CARE_PROVIDER_SITE_OTHER): Payer: Self-pay | Admitting: Family Medicine

## 2023-04-28 DIAGNOSIS — F5089 Other specified eating disorder: Secondary | ICD-10-CM

## 2023-05-25 ENCOUNTER — Encounter (INDEPENDENT_AMBULATORY_CARE_PROVIDER_SITE_OTHER): Payer: Self-pay | Admitting: Family Medicine

## 2023-05-25 ENCOUNTER — Ambulatory Visit (INDEPENDENT_AMBULATORY_CARE_PROVIDER_SITE_OTHER): Payer: Medicare Other | Admitting: Family Medicine

## 2023-05-25 VITALS — BP 133/67 | HR 69 | Temp 97.9°F | Ht 69.0 in | Wt 233.0 lb

## 2023-05-25 DIAGNOSIS — M545 Low back pain, unspecified: Secondary | ICD-10-CM

## 2023-05-25 DIAGNOSIS — E669 Obesity, unspecified: Secondary | ICD-10-CM | POA: Diagnosis not present

## 2023-05-25 DIAGNOSIS — F5089 Other specified eating disorder: Secondary | ICD-10-CM | POA: Diagnosis not present

## 2023-05-25 DIAGNOSIS — R7303 Prediabetes: Secondary | ICD-10-CM | POA: Diagnosis not present

## 2023-05-25 DIAGNOSIS — Z6834 Body mass index (BMI) 34.0-34.9, adult: Secondary | ICD-10-CM

## 2023-05-25 MED ORDER — BUPROPION HCL ER (SR) 200 MG PO TB12: 200.0000 mg | ORAL_TABLET | Freq: Every day | ORAL | 0 refills | Status: DC

## 2023-05-25 NOTE — Progress Notes (Signed)
 Office: [phone]  /  Fax: [phone]  WEIGHT SUMMARY AND BIOMETRICS  Anthropometric Measurements Height: 5\' 9"  (1.753 m) Weight: 233 lb (105.7 kg) BMI (Calculated): 34.39 Weight at Last Visit: 232 lb Weight Lost Since Last Visit: 0 Weight Gained Since Last Visit: 1 lb Starting Weight: 248 lb Total Weight Loss (lbs): 15 lb (6.804 kg)   Body Composition  Body Fat %: 32.7 % Fat Mass (lbs): 76.4 lbs Muscle Mass (lbs): 149.2 lbs Total Body Water (lbs): 108.6 lbs Visceral Fat Rating : 21   Other Clinical Data Fasting: Yes Labs: no Today's Visit #: 30 Starting Date: 12/20/19    Chief Complaint: OBESITY   History of Present Illness   Stephen Oconnell is a 78 year old male who presents for obesity treatment.  He has been following a category three eating plan 60% of the time and engages in physical activity by walking for 50 minutes three times a week. Despite these efforts, he has gained one pound. He has a history of emotional eating behaviors and is currently taking Wellbutrin 150 mg daily, for which he requests a refill.  He has a history of prediabetes and is on metformin, which he takes twice a day. He keeps one pill bottle at the dining table to help remember his doses.  He reports increasing pain in his lower back, which he attributes to a fall last year that resulted in a diagnosis of stenosis. He has not yet pursued surgical consultation.  He experiences migraines with the change in weather and takes rizatriptan for these headaches.  He has seasonal allergies for which he receives shots and takes Claritin at night to avoid drowsiness.          PHYSICAL EXAM:  Blood pressure 133/67, pulse 69, temperature 97.9 F (36.6 C), height 5\' 9"  (1.753 m), weight 233 lb (105.7 kg), SpO2 98%. Body mass index is 34.41 kg/m.  DIAGNOSTIC DATA REVIEWED:  BMET    Component Value Date/Time   NA 142 03/23/2023 0843   K 4.2 03/23/2023 0843   CL 98 03/23/2023 0843    CO2 28 03/23/2023 0843   GLUCOSE 122 (H) 03/23/2023 0843   BUN 21 03/23/2023 0843   CREATININE 1.13 03/23/2023 0843   CALCIUM 9.5 03/23/2023 0843   GFRNONAA 57 (L) 04/08/2020 0801   GFRAA 65 04/08/2020 0801   Lab Results  Component Value Date   HGBA1C 5.6 03/23/2023   HGBA1C 6.0 (H) 12/20/2019   Lab Results  Component Value Date   INSULIN 23.5 03/23/2023   INSULIN 29.5 (H) 12/20/2019   Lab Results  Component Value Date   TSH 2.500 03/23/2023   CBC    Component Value Date/Time   WBC 5.7 03/23/2023 0843   RBC 4.91 03/23/2023 0843   HGB 16.1 03/23/2023 0843   HCT 47.1 03/23/2023 0843   PLT 216 03/23/2023 0843   MCV 96 03/23/2023 0843   MCH 32.8 03/23/2023 0843   MCHC 34.2 03/23/2023 0843   RDW 13.0 03/23/2023 0843   Iron Studies    Component Value Date/Time   IRON 122 03/23/2023 0843   TIBC 302 03/23/2023 0843   FERRITIN 196 03/23/2023 0843   IRONPCTSAT 40 03/23/2023 0843   Lipid Panel     Component Value Date/Time   CHOL 114 03/23/2023 0843   TRIG 72 03/23/2023 0843   HDL 52 03/23/2023 0843   LDLCALC 47 03/23/2023 0843   Hepatic Function Panel     Component Value Date/Time   PROT  7.4 03/23/2023 0843   ALBUMIN 4.5 03/23/2023 0843   AST 20 03/23/2023 0843   ALT 20 03/23/2023 0843   ALKPHOS 58 03/23/2023 0843   BILITOT 0.5 03/23/2023 0843      Component Value Date/Time   TSH 2.500 03/23/2023 0843   Nutritional Lab Results  Component Value Date   VD25OH 45.2 03/23/2023   VD25OH 45.9 10/21/2022   VD25OH 47.0 04/20/2022     Assessment and Plan    Low back pain Increasing lower back pain with a history of stenosis diagnosed post-fall last year. Prefers non-surgical options before considering surgery. Referral to sports medicine is recommended for non-surgical pain relief modalities. - Refer to Dr. Clementeen Graham, sports medicine physician, for further evaluation and management of low back pain.  Obesity and Emotional Eating Behaviors Following a  category three eating plan 60% of the time and walking for 50 minutes three times a week. Despite efforts, weight increased by one pound. Reports emotional eating behaviors. Current Wellbutrin (150 mg daily) may not effectively manage cravings. Increasing to 200 mg may improve control, with further adjustments possible if needed. - Increase Wellbutrin SR to 200 mg once daily. - Continue current diet and exercise regimen.  Prediabetes Managing prediabetes with metformin, diet, and exercise. Adheres to twice-daily metformin routine with a strategy to remember doses. - Continue metformin as prescribed. - Maintain current diet and exercise regimen.  Follow-up Follow-up appointment scheduled in April. Needs to schedule one for May. - Schedule follow-up appointment for May.          He was informed of the importance of frequent follow up visits to maximize his success with intensive lifestyle modifications for his multiple health conditions.    Quillian Quince, MD

## 2023-06-08 ENCOUNTER — Ambulatory Visit (INDEPENDENT_AMBULATORY_CARE_PROVIDER_SITE_OTHER)

## 2023-06-08 ENCOUNTER — Ambulatory Visit: Admitting: Family Medicine

## 2023-06-08 VITALS — BP 164/74 | HR 72 | Ht 69.0 in | Wt 243.0 lb

## 2023-06-08 DIAGNOSIS — G8929 Other chronic pain: Secondary | ICD-10-CM

## 2023-06-08 DIAGNOSIS — M545 Low back pain, unspecified: Secondary | ICD-10-CM

## 2023-06-08 NOTE — Patient Instructions (Addendum)
 Thank you for coming in today.   Please get an Xray today before you leave.  A referral for physical therapy has been submitted. A representative from the physical therapy office will contact you to coordinate scheduling after confirming your benefits with your insurance provider. If you do not hear from the physical therapy office within the next 1-2 weeks, please let us know.    See you back in 6 weeks

## 2023-06-08 NOTE — Progress Notes (Unsigned)
   Stephen Payor, PhD, LAT, ATC acting as a scribe for Stephen Graham, MD.  Stephen Oconnell is a 78 y.o. male who presents to [facility] at [facility] today for LBP. He related the pain to a fall he suffered about a year ago. Pt locates pain to midline of his low back. He notes general "stiffness" and difficulty standing up straight.   Radiating pain: no LE numbness/tingling: no LE weakness: yes- "unstable" feeling Aggravates: nothing in particular Treatments tried: naproxen  Dx imaging: 03/29/22 L-spine & R hip CT  Pertinent review of systems: No fevers or chills  Relevant historical information: Hypertension   Exam:  BP (!) 164/74   Pulse 72   Ht 5\' 9"  (1.753 m)   Wt 243 lb (110.2 kg)   SpO2 97%   BMI 35.88 kg/m  General: Well Developed, well nourished, and in no acute distress.   MSK: L-spine decreased lumbar motion.  Nontender palpation midline.  Lower extremity strength is intact.    Lab and Radiology Results  X-ray images lumbar spine obtained today personally and independently interpreted. Anterior listhesis L5-S1.  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 Await formal radiology review  CT scan lumbar spine January 2024 MPRESSION: 1. Acute nondisplaced oblique fractures through the right and left lateral corners of the L1 inferior endplate and right anterior corner of the L2 superior endplate. The left L1 inferior endplate fracture extends to the posterior cortex. No extension to the posterior elements. 2. Multilevel lumbar spondylosis as described above. 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 3. 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 4.  Aortic Atherosclerosis (ICD10-I70.0).  Assessment and Plan: 78 y.o. male with chronic low back pain.  Based on his current x-ray and previous CT scan he has degenerative changes throughout the low especially the lower portion lumbar spine and moderate to severe  spinal stenosis at L4-5 and potential neuroforaminal stenosis at L5-S1.  He does not have much lumbar radicular symptoms.  Plan for trial of physical therapy.  If not improving would recommend proceed to MRI to further characterize source of pain and for injection planning.   PDMP not reviewed this encounter. Orders Placed This Encounter  Procedures   DG Lumbar Spine 2-3 Views    Standing Status:   Future    Number of Occurrences:   1    Expiration Date:   07/08/2023    Reason for Exam (SYMPTOM  OR DIAGNOSIS REQUIRED):   low back pain    Preferred imaging location?:   [facility]   Ambulatory referral to Physical Therapy    Referral Priority:   Routine    Referral Type:   Physical Medicine    Referral Reason:   Specialty Services Required    Requested Specialty:   Physical Therapy    Number of Visits Requested:   1   No orders of the defined types were placed in this encounter.    Discussed warning signs or symptoms. Please see discharge instructions. Patient expresses understanding.   The above documentation has been reviewed and is accurate and complete Stephen Oconnell, M.D.

## 2023-06-10 ENCOUNTER — Encounter: Payer: Self-pay | Admitting: Family Medicine

## 2023-06-10 NOTE — Progress Notes (Signed)
 Low back x-ray shows some arthritis changes severe in places at the base of the spine.

## 2023-06-23 ENCOUNTER — Ambulatory Visit (INDEPENDENT_AMBULATORY_CARE_PROVIDER_SITE_OTHER): Payer: Medicare Other | Admitting: Family Medicine

## 2023-06-23 ENCOUNTER — Other Ambulatory Visit (INDEPENDENT_AMBULATORY_CARE_PROVIDER_SITE_OTHER): Payer: Self-pay | Admitting: Family Medicine

## 2023-06-23 ENCOUNTER — Encounter (INDEPENDENT_AMBULATORY_CARE_PROVIDER_SITE_OTHER): Payer: Self-pay | Admitting: Family Medicine

## 2023-06-23 VITALS — BP 130/72 | HR 68 | Temp 97.6°F | Ht 69.0 in | Wt 236.0 lb

## 2023-06-23 DIAGNOSIS — Z6834 Body mass index (BMI) 34.0-34.9, adult: Secondary | ICD-10-CM

## 2023-06-23 DIAGNOSIS — I10 Essential (primary) hypertension: Secondary | ICD-10-CM | POA: Diagnosis not present

## 2023-06-23 DIAGNOSIS — E785 Hyperlipidemia, unspecified: Secondary | ICD-10-CM

## 2023-06-23 DIAGNOSIS — E669 Obesity, unspecified: Secondary | ICD-10-CM

## 2023-06-23 DIAGNOSIS — E538 Deficiency of other specified B group vitamins: Secondary | ICD-10-CM | POA: Diagnosis not present

## 2023-06-23 DIAGNOSIS — M1990 Unspecified osteoarthritis, unspecified site: Secondary | ICD-10-CM

## 2023-06-23 DIAGNOSIS — E7849 Other hyperlipidemia: Secondary | ICD-10-CM

## 2023-06-23 DIAGNOSIS — F5089 Other specified eating disorder: Secondary | ICD-10-CM

## 2023-06-23 DIAGNOSIS — M549 Dorsalgia, unspecified: Secondary | ICD-10-CM

## 2023-06-23 DIAGNOSIS — R7303 Prediabetes: Secondary | ICD-10-CM | POA: Diagnosis not present

## 2023-06-23 DIAGNOSIS — E559 Vitamin D deficiency, unspecified: Secondary | ICD-10-CM

## 2023-06-23 MED ORDER — TOPIRAMATE 50 MG PO TABS: 50.0000 mg | ORAL_TABLET | Freq: Every day | ORAL | 0 refills | Status: DC

## 2023-06-23 NOTE — Progress Notes (Signed)
 Office: [phone]  /  Fax: [phone]  WEIGHT SUMMARY AND BIOMETRICS  Anthropometric Measurements Height: 5\' 9"  (1.753 m) Weight: 236 lb (107 kg) BMI (Calculated): 34.84 Weight at Last Visit: 243lb Weight Lost Since Last Visit: 0lb Weight Gained Since Last Visit: 3lb Starting Weight: 248lb Total Weight Loss (lbs): 12 lb (5.443 kg)   Body Composition  Body Fat %: 32.5 % Fat Mass (lbs): 76.8 lbs Muscle Mass (lbs): 151.4 lbs Total Body Water (lbs): 111.2 lbs Visceral Fat Rating : 21   Other Clinical Data Fasting: Yes Today's Visit #: 37 Starting Date: 12/20/19    Chief Complaint: OBESITY     History of Present Illness He is a 78 year old male presenting for obesity treatment plan assessment and management of related comorbidities.  He follows his prescribed category three eating plan 65% of the time and engages in walking for one hour four times per week. Despite these efforts, he has gained three pounds in the last month since his last visit. He struggles with starch cravings, particularly at dinner, and consumes a half baked potato and a large bowl of popcorn without butter before bed. His eating habits deteriorate after breakfast, with temptation for snacks being a significant challenge.  He is managing hypertension with a current blood pressure reading of 130/72. He has hyperlipidemia and is on Crestor for treatment, while also working on decreasing cholesterol in his diet.  He has a history of prediabetes and is on metformin 500 mg twice daily, in addition to decreasing simple carbohydrates in his diet.  He has a history of vitamin D deficiency but is not currently on vitamin D supplementation and is due to have his levels checked. He also has a history of B12 deficiency and is not on B12 supplementation but follows a B12-rich diet.      PHYSICAL EXAM:  Blood pressure 130/72, pulse 68, temperature 97.6 F (36.4 C), height 5\' 9"  (1.753 m), weight 236 lb  (107 kg), SpO2 97%. Body mass index is 34.85 kg/m.  DIAGNOSTIC DATA REVIEWED:  BMET    Component Value Date/Time   NA 142 03/23/2023 0843   K 4.2 03/23/2023 0843   CL 98 03/23/2023 0843   CO2 28 03/23/2023 0843   GLUCOSE 122 (H) 03/23/2023 0843   BUN 21 03/23/2023 0843   CREATININE 1.13 03/23/2023 0843   CALCIUM 9.5 03/23/2023 0843   GFRNONAA 57 (L) 04/08/2020 0801   GFRAA 65 04/08/2020 0801   Lab Results  Component Value Date   HGBA1C 5.6 03/23/2023   HGBA1C 6.0 (H) 12/20/2019   Lab Results  Component Value Date   INSULIN 23.5 03/23/2023   INSULIN 29.5 (H) 12/20/2019   Lab Results  Component Value Date   TSH 2.500 03/23/2023   CBC    Component Value Date/Time   WBC 5.7 03/23/2023 0843   RBC 4.91 03/23/2023 0843   HGB 16.1 03/23/2023 0843   HCT 47.1 03/23/2023 0843   PLT 216 03/23/2023 0843   MCV 96 03/23/2023 0843   MCH 32.8 03/23/2023 0843   MCHC 34.2 03/23/2023 0843   RDW 13.0 03/23/2023 0843   Iron Studies    Component Value Date/Time   IRON 122 03/23/2023 0843   TIBC 302 03/23/2023 0843   FERRITIN 196 03/23/2023 0843   IRONPCTSAT 40 03/23/2023 0843   Lipid Panel     Component Value Date/Time   CHOL 114 03/23/2023 0843   TRIG 72 03/23/2023 0843   HDL 52 03/23/2023 0843   LDLCALC  47 03/23/2023 0843   Hepatic Function Panel     Component Value Date/Time   PROT 7.4 03/23/2023 0843   ALBUMIN 4.5 03/23/2023 0843   AST 20 03/23/2023 0843   ALT 20 03/23/2023 0843   ALKPHOS 58 03/23/2023 0843   BILITOT 0.5 03/23/2023 0843      Component Value Date/Time   TSH 2.500 03/23/2023 0843   Nutritional Lab Results  Component Value Date   VD25OH 45.2 03/23/2023   VD25OH 45.9 10/21/2022   VD25OH 47.0 04/20/2022     Assessment and Plan Assessment & Plan Obesity He follows a category three eating plan 65% of the time and walks for one hour four times per week. Despite these efforts, he gained three pounds in the last month, attributed to water  retention. He struggles with starch cravings, particularly at dinner, and has experienced a plateau in weight loss. A lower carbohydrate plan is recommended to reset cravings and overcome the plateau. The plan includes unrestricted foods but requires careful hydration to prevent dehydration, which can cause headaches and lightheadedness. Discussed topiramate for weight management, noting potential taste disturbances and drowsiness at higher doses. He agreed to try a lower dose despite previous adverse effects at higher doses for migraine treatment. - Switch to a lower carbohydrate eating plan. - Increase hydration to prevent dehydration. - Discontinue bupropion. - Start topiramate 50 mg, take half a pill daily.  Prediabetes He is on metformin 500 mg twice daily and is working on decreasing simple carbohydrates in his diet. He is due for lab work to monitor glucose levels. - Order glucose level labs. -Continue diet and weight loss  Hypertension Hypertension is well-controlled with a blood pressure reading of 130/72 mmHg. -Continue diet and weight loss  Hyperlipidemia He is on Crestor and is working on decreasing dietary cholesterol. He is due for lab work to evaluate lipid levels. - Order lipid panel labs next month  Back Pain due to Arthritis He has arthritis in the back and is scheduled to start physical therapy next week. Physical therapy is expected to be beneficial, and he has a follow-up appointment with a specialist in May. Insurance typically requires physical therapy before approving further interventions. - Proceed with physical therapy for back pain. - Follow up with specialist in May.  Vitamin D Deficiency He is not currently on supplementation and is due for lab work to assess vitamin D levels. - Order vitamin D level labs next month  Vitamin B12 Deficiency He is following a B12-rich diet and is due for lab work to assess B12 levels. - Order vitamin B12 level labs next  month  Follow-up He is scheduled for a follow-up appointment in May and is considering scheduling another appointment in June. - Follow up appointment on May 19th. - Schedule follow-up appointment in June.      He was informed of the importance of frequent follow up visits to maximize his success with intensive lifestyle modifications for his multiple health conditions.    Jasmine Mesi, MD

## 2023-06-28 NOTE — Therapy (Unsigned)
 OUTPATIENT PHYSICAL THERAPY THORACOLUMBAR EVALUATION   Patient Name: Stephen Oconnell MRN: [id number] DOB:06/11/1945, 78 y.o., male Today's Date: 06/29/2023  END OF SESSION:  PT End of Session - 06/29/23 1517     Visit Number 1    Number of Visits 16    Date for PT Re-Evaluation 09/21/23    Authorization Type [employer] Medicare - [employer]    Progress Note Due on Visit 10    PT Start Time 1517    PT Stop Time 1555    PT Time Calculation (min) 38 min    Activity Tolerance Patient tolerated treatment well    Behavior During Therapy WFL for tasks assessed/performed             Past Medical History:  Diagnosis Date   Allergies    Asthma    on inhaler   Constipation    COPD (chronic obstructive pulmonary disease) (HCC)    Emphysema lung (HCC)    GERD (gastroesophageal reflux disease)    Glaucoma    right eye   Hearing loss    Bil/has hearing aids   HTN (hypertension)    Leg swelling    Migraines    Obesity    OSA on CPAP    Skin cancer of forehead    Past Surgical History:  Procedure Laterality Date   CATARACT EXTRACTION     MOHS SURGERY     WISDOM TOOTH EXTRACTION     Patient Active Problem List   Diagnosis Date Noted   Daytime somnolence 03/23/2023   Other hyperlipidemia 08/10/2022   Vitamin D  deficiency 04/20/2022   BMI 34.0-34.9,adult 04/20/2022   Obesity, Beginning BMI 36.62 04/20/2022   Eating disorder 02/09/2022   SOB (shortness of breath) on exertion 10/13/2021   Insulin  resistance 10/13/2021   Essential hypertension 09/14/2021   Mixed hyperlipidemia 10/21/2020   Pre-diabetes 05/21/2020   Class 2 severe obesity with serious comorbidity and body mass index (BMI) of 36.0 to 36.9 in adult (HCC) 05/21/2020   Allergic rhinitis 04/18/2020   Allergic rhinitis due to animal (cat) (dog) hair and dander 04/18/2020   Allergic rhinitis due to pollen 04/18/2020   Chronic allergic conjunctivitis 04/18/2020   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   Localized,  primary osteoarthritis 04/18/2020   Low back pain 04/18/2020   Mild intermittent asthma 04/18/2020   Osteoarthritis of knee 04/18/2020   Excessive cerumen in ear canal, bilateral 01/10/2018   Sensorineural hearing loss (SNHL), 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 States that he is walking more forward and whenever he goes for  a walk after about a block he starts having pain. Pain is just in the back. No injections or HEP yet.   Reports he also notices that he has bilateral hip pain on the sides of his hip but does not think that is related  PERTINENT HISTORY:  COPD, vagal nerve reaction history, body migraines, bilateral hip pain PAIN:  Are you having pain? Yes: NPRS scale: 3/10 Pain location: center of low back  Pain description: discomfort Aggravating factors: walking Relieving factors: sitting  PRECAUTIONS: None  RED FLAGS: None   WEIGHT BEARING RESTRICTIONS: No  FALLS:  Has patient fallen in last 6 months? No    OCCUPATION: Likes to [employer]  PLOF: Independent  PATIENT GOALS: Less pain  OBJECTIVE:  Note: Objective measures were completed at Evaluation unless otherwise noted.  DIAGNOSTIC FINDINGS:  06/09/23 lumbar xray   FINDINGS: There is no evidence of lumbar spine fracture. Alignment is normal. Moderate narrow intervertebral space  with grade 1 anterolisthesis of L5 on S1. Severe facet joint degenerative changes at L5-S1. Anterior spurring is identified throughout the thoracic and lumbar spine with relative sparing of L5.   IMPRESSION: Degenerative joint changes of lumbar spine.  PATIENT SURVEYS:  Patient-specific activity functional scoring scheme (Point to one number):  "0" represents "unable to perform." "10" represents "able to perform at prior level. 0 1 2 3 4 5 6 7 8 9  10 (Date and Score) Activity Initial  Activity Eval     Walking a block  5     Mowing the lawn  2    Lifting items 5    Additional Additional Total score = sum of the activity scores/number of activities Minimum detectable change (90%CI) for average score = 2 points Minimum detectable change (90%CI) for single activity score = 3 points PSFS developed by: [place] Spitz., & Binkley, J. (1995). Assessing disability and change on individual  patients: a report of a patient specific measure. Physiotherapy [place], 47, [phone]. Reproduced with the permission of the authors  Score: 12/3=4   COGNITION: Overall cognitive status: Within functional limits for tasks assessed     SENSATION: Not tested    POSTURE: rounded shoulders, forward head, and flexed trunk   PALPATION: No tenderness to palpation but increased resting tone noted in bilateral lumbar paraspinals and hypomobility noted in lumbar spine  LUMBAR ROM:   AROM eval  Flexion 25% limited  Extension 90% limited*  Right lateral flexion 50% limited  Left lateral flexion 50% limited  Right rotation   Left rotation    (Blank rows = not tested)    LE Measurements Lower Extremity Right EVAL Left EVAL   A/PROM MMT A/PROM MMT  Hip Flexion      Hip Extension      Hip Abduction      Hip Adduction      Hip Internal rotation   40*   Hip External rotation   45*   Knee Flexion      Knee Extension      Ankle Dorsiflexion      Ankle Plantarflexion       Ankle Inversion      Ankle Eversion       (Blank rows = not tested) * pain   LUMBAR SPECIAL TESTS:  Slump negative Ely's +  B  TREATMENT DATE:                                                                                                                               06/29/2023  Therapeutic Exercise:  Aerobic: Supine: 90/90 relief on ball 3 minutes, DKC 2 minutes, lower trunk rotations 2 minutes Prone:  Seated:  Standing: Neuromuscular Re-education: Manual Therapy: Lumbar traction with ball 6 minutes Therapeutic Activity: Self Care: Use of pulse oximeter at church to determine if oxygen drops resulting in him falling asleep or passing out Trigger Point Dry Needling:  Modalities:     PATIENT EDUCATION:  Education details: on current presentation, on HEP, on clinical outcomes score and POC Person educated: Patient Education method: Explanation, Demonstration, and Handouts Education comprehension: verbalized understanding   HOME EXERCISE PROGRAM: [id number]  ASSESSMENT:  CLINICAL IMPRESSION: Patient presents to physical therapy with complaints of chronic low back pain and diagnosis of stenosis.  Patient presents with limited range of motion, strength and overall posture that is contributing to current presentation.  Session focused on education as well as plan moving forward.  Patient would greatly benefit from skilled PT to address physical impairments and improve overall function and quality of life.  OBJECTIVE IMPAIRMENTS: decreased ROM, decreased strength, postural dysfunction, and pain.   ACTIVITY LIMITATIONS: carrying, lifting, standing, transfers, and locomotion level  PARTICIPATION LIMITATIONS: meal prep, cleaning, and woodworking  PERSONAL FACTORS: Fitness and Time since onset of injury/illness/exacerbation are also affecting patient's functional outcome.   REHAB POTENTIAL: Good  CLINICAL DECISION MAKING: Stable/uncomplicated  EVALUATION COMPLEXITY:  Low   GOALS: Goals reviewed with patient? yes  SHORT TERM GOALS: Target date: 08/10/2023 Patient will be independent in self management strategies to improve quality of life and functional outcomes. Baseline: New Program Goal status: INITIAL  2.  Patient will report at least 50% improvement in overall symptoms and/or function to demonstrate improved functional mobility Baseline: 0% better Goal status: INITIAL  3.  Patient will be able to walk 1 block without severe pain and back to improve functional mobility in the community Baseline: Unable     LONG TERM GOALS: Target date: 09/21/2023   Patient will report at least 75% improvement in overall symptoms and/or function to demonstrate improved functional mobility Baseline: 0% better Goal status: INITIAL  2.  Patient will score at least 2 points higher on PSFS average to demonstrate change in overall function. Baseline: see above Goal status: INITIAL  3.  Patient will be able to walk 3 blocks without severe pain to improve functional mobility in the community Baseline: Unable Goal status: INITIAL    PLAN:  PT FREQUENCY: 1-2x/week for total of 16 visits over 12 weeks certification.  PT DURATION: 12 weeks  PLANNED INTERVENTIONS: 97110-Therapeutic exercises, 97530- Therapeutic activity, [id number]- Neuromuscular re-education, 97535- Self Care, 54098- Manual therapy, [phone]- Gait training, [phone]- Orthotic Fit/training, [phone]- Canalith repositioning, [id number]- Aquatic Therapy, [id number]- Electrical stimulation (unattended), [phone]- Ionotophoresis 4mg /ml Dexamethasone, Patient/Family education, Balance  training, Stair training, Taping, Dry Needling, Joint mobilization, Joint manipulation, Spinal manipulation, Spinal mobilization, Cryotherapy, and Moist heat   PLAN FOR NEXT SESSION: Decompression traction, lumbar mobility, gentle extension if tolerated, anterior stretching and posterior strengthening  4:55 PM, 06/29/23 Tabitha Ewings,  DPT Physical Therapy with [facility]

## 2023-06-29 ENCOUNTER — Ambulatory Visit: Admitting: Physical Therapy

## 2023-06-29 ENCOUNTER — Encounter: Payer: Self-pay | Admitting: Physical Therapy

## 2023-06-29 DIAGNOSIS — M6281 Muscle weakness (generalized): Secondary | ICD-10-CM

## 2023-06-29 DIAGNOSIS — M5459 Other low back pain: Secondary | ICD-10-CM | POA: Diagnosis not present

## 2023-07-02 ENCOUNTER — Other Ambulatory Visit (INDEPENDENT_AMBULATORY_CARE_PROVIDER_SITE_OTHER): Payer: Self-pay | Admitting: Family Medicine

## 2023-07-02 DIAGNOSIS — F5089 Other specified eating disorder: Secondary | ICD-10-CM

## 2023-07-05 ENCOUNTER — Ambulatory Visit: Admitting: Physical Therapy

## 2023-07-05 ENCOUNTER — Encounter: Payer: Self-pay | Admitting: Physical Therapy

## 2023-07-05 DIAGNOSIS — M6281 Muscle weakness (generalized): Secondary | ICD-10-CM

## 2023-07-05 DIAGNOSIS — M5459 Other low back pain: Secondary | ICD-10-CM | POA: Diagnosis not present

## 2023-07-05 NOTE — Therapy (Signed)
 OUTPATIENT PHYSICAL THERAPY THORACOLUMBAR TREATMENT   Patient Name: Stephen Oconnell MRN: [id number] DOB:04-Jun-1945, 78 y.o., male Today's Date: 07/05/2023  END OF SESSION:  PT End of Session - 07/05/23 1458     Visit Number 2    Number of Visits 16    Date for PT Re-Evaluation 09/21/23    Authorization Type [employer] Medicare - [employer] requested 6 initial visits approved for 8 weeks    Authorization - Visit Number 2    Authorization - Number of Visits 6    Progress Note Due on Visit 10    PT Start Time 1518    PT Stop Time 1559    PT Time Calculation (min) 41 min    Activity Tolerance Patient tolerated treatment well    Behavior During Therapy WFL for tasks assessed/performed             Past Medical History:  Diagnosis Date   Allergies    Asthma    on inhaler   Constipation    COPD (chronic obstructive pulmonary disease) (HCC)    Emphysema lung (HCC)    GERD (gastroesophageal reflux disease)    Glaucoma    right eye   Hearing loss    Bil/has hearing aids   HTN (hypertension)    Leg swelling    Migraines    Obesity    OSA on CPAP    Skin cancer of forehead    Past Surgical History:  Procedure Laterality Date   CATARACT EXTRACTION     MOHS SURGERY     WISDOM TOOTH EXTRACTION     Patient Active Problem List   Diagnosis Date Noted   Daytime somnolence 03/23/2023   Other hyperlipidemia 08/10/2022   Vitamin D  deficiency 04/20/2022   BMI 34.0-34.9,adult 04/20/2022   Obesity, Beginning BMI 36.62 04/20/2022   Eating disorder 02/09/2022   SOB (shortness of breath) on exertion 10/13/2021   Insulin  resistance 10/13/2021   Essential hypertension 09/14/2021   Mixed hyperlipidemia 10/21/2020   Pre-diabetes 05/21/2020   Class 2 severe obesity with serious comorbidity and body mass index (BMI) of 36.0 to 36.9 in adult (HCC) 05/21/2020   Allergic rhinitis 04/18/2020   Allergic rhinitis due to animal (cat) (dog) hair and dander 04/18/2020   Allergic rhinitis due to pollen  04/18/2020   Chronic allergic conjunctivitis 04/18/2020   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   Localized, primary osteoarthritis 04/18/2020   Low back pain 04/18/2020   Mild intermittent asthma 04/18/2020   Osteoarthritis of knee 04/18/2020   Excessive cerumen in ear canal, bilateral 01/10/2018   Sensorineural hearing loss (SNHL), 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    PCP: Barnetta Liberty, MD  REFERRING PROVIDER: Syliva Even, MD   REFERRING DIAG: M54.50,G89.29 (ICD-10-CM) - Chronic low back pain, unspecified back pain laterality, unspecified whether sciatica present  Rationale for Evaluation and Treatment: Rehabilitation  THERAPY DIAG:  Other low back pain  Muscle weakness (generalized)  ONSET DATE: one year ago  SUBJECTIVE:  SUBJECTIVE STATEMENT: 07/05/2023 Exercises seem to be helping. Not having any pain and able to walk better.    EVAL: States he has had back pain that has increased over the last year. States that he is walking more forward and whenever he goes for  a walk after about a block he starts having pain. Pain is just in the back. No injections or HEP yet.   Reports he also notices that he has bilateral hip pain on the sides of his hip but does not think that is related  PERTINENT HISTORY:  COPD, vagal nerve reaction history, body migraines, bilateral hip pain PAIN:  Are you having pain? Yes: NPRS scale: 3/10 Pain location: center of low back  Pain description: discomfort Aggravating factors: walking Relieving factors: sitting  PRECAUTIONS: None  RED FLAGS: None   WEIGHT BEARING RESTRICTIONS: No  FALLS:  Has patient fallen in last 6 months? No    OCCUPATION: Likes to [employer]  PLOF: Independent  PATIENT GOALS: Less pain  OBJECTIVE:  Note: Objective measures  were completed at Evaluation unless otherwise noted.  DIAGNOSTIC FINDINGS:  06/09/23 lumbar xray   FINDINGS: There is no evidence of lumbar spine fracture. Alignment is normal. Moderate narrow intervertebral space with grade 1 anterolisthesis of L5 on S1. Severe facet joint degenerative changes at L5-S1. Anterior spurring is identified throughout the thoracic and lumbar spine with relative sparing of L5.   IMPRESSION: Degenerative joint changes of lumbar spine.  PATIENT SURVEYS:  Patient-specific activity functional scoring scheme (Point to one number):  "0" represents "unable to perform." "10" represents "able to perform at prior level. 0 1 2 3 4 5 6 7 8 9  10 (Date and Score) Activity Initial  Activity Eval     Walking a block  5     Mowing the lawn  2    Lifting items 5    Additional Additional Total score = sum of the activity scores/number of activities Minimum detectable change (90%CI) for average score = 2 points Minimum detectable change (90%CI) for single activity score = 3 points PSFS developed by: Melbourne Spitz., & Binkley, J. (1995). Assessing disability and change on individual  patients: a report of a patient specific measure. Physiotherapy [place], 47, [phone]. Reproduced with the permission of the authors  Score: 12/3=4   COGNITION: Overall cognitive status: Within functional limits for tasks assessed     SENSATION: Not tested    POSTURE: rounded shoulders, forward head, and flexed trunk   PALPATION: No tenderness to palpation but increased resting tone noted in bilateral lumbar paraspinals and hypomobility noted in lumbar spine  LUMBAR ROM:   AROM eval  Flexion 25% limited  Extension 90% limited*  Right lateral flexion 50% limited  Left lateral flexion 50% limited  Right rotation   Left rotation    (Blank rows = not tested)    LE Measurements Lower Extremity Right EVAL Left EVAL   A/PROM MMT A/PROM MMT  Hip  Flexion      Hip Extension      Hip Abduction      Hip Adduction      Hip Internal rotation   40*   Hip External rotation   45*   Knee Flexion      Knee Extension      Ankle Dorsiflexion      Ankle Plantarflexion      Ankle Inversion      Ankle Eversion       (Blank rows =  not tested) * pain   LUMBAR SPECIAL TESTS:  Slump negative Ely's +  B   TREATMENT DATE:                                                                                                                               07/05/2023  Therapeutic Exercise:  Aerobic: Supine: 90/90 relief on ball 1 minute, DKC 2 minutes, lower trunk rotations 2 minutes, bridges  3x10 2" holds, thomas stretch x3 30" holds B Prone: lying 3 minutes, hamstring curls 3x10 B slow and controlled  Seated:  Standing: Neuromuscular Re-education: Manual Therapy: Lumbar traction with ball 7 minutes Therapeutic Activity: Self Care:   Trigger Point Dry Needling:  Modalities:     PATIENT EDUCATION:  Education details: on  HEP, on anatomy and rationale behind interventions  Person educated: Patient Education method: Explanation, Demonstration, and Handouts Education comprehension: verbalized understanding   HOME EXERCISE PROGRAM: [id number]  ASSESSMENT:  CLINICAL IMPRESSION: 07/05/2023 Reviewed HEP and added prone exercises. Tolerated well with only slight pain when patient compensated with lumbar rotation. Cued patient to limit this and pain reduce.d Added new exercises to HEP and no pain or soreness noted end of session. Will continue with current POC as tolerated.    EVAL: Patient presents to physical therapy with complaints of chronic low back pain and diagnosis of stenosis.  Patient presents with limited range of motion, strength and overall posture that is contributing to current presentation.  Session focused on education as well as plan moving forward.  Patient would greatly benefit from skilled PT to address physical impairments  and improve overall function and quality of life.  OBJECTIVE IMPAIRMENTS: decreased ROM, decreased strength, postural dysfunction, and pain.   ACTIVITY LIMITATIONS: carrying, lifting, standing, transfers, and locomotion level  PARTICIPATION LIMITATIONS: meal prep, cleaning, and woodworking  PERSONAL FACTORS: Fitness and Time since onset of injury/illness/exacerbation are also affecting patient's functional outcome.   REHAB POTENTIAL: Good  CLINICAL DECISION MAKING: Stable/uncomplicated  EVALUATION COMPLEXITY: Low   GOALS: Goals reviewed with patient? yes  SHORT TERM GOALS: Target date: 08/10/2023 Patient will be independent in self management strategies to improve quality of life and functional outcomes. Baseline: New Program Goal status: INITIAL  2.  Patient will report at least 50% improvement in overall symptoms and/or function to demonstrate improved functional mobility Baseline: 0% better Goal status: INITIAL  3.  Patient will be able to walk 1 block without severe pain and back to improve functional mobility in the community Baseline: Unable     LONG TERM GOALS: Target date: 09/21/2023   Patient will report at least 75% improvement in overall symptoms and/or function to demonstrate improved functional mobility Baseline: 0% better Goal status: INITIAL  2.  Patient will score at least 2 points higher on PSFS average to demonstrate change in overall function. Baseline: see above Goal status: INITIAL  3.  Patient will be able to walk 3 blocks without severe pain to  improve functional mobility in the community Baseline: Unable Goal status: INITIAL    PLAN:  PT FREQUENCY: 1-2x/week for total of 16 visits over 12 weeks certification.  PT DURATION: 12 weeks  PLANNED INTERVENTIONS: 97110-Therapeutic exercises, 97530- Therapeutic activity, [id number]- Neuromuscular re-education, [phone]- Self Care, 29528- Manual therapy, [phone]- Gait training, [phone]- Orthotic Fit/training,  [phone]- Canalith repositioning, [phone]- Aquatic Therapy, 97014- Electrical stimulation (unattended), [phone]- Ionotophoresis 4mg /ml Dexamethasone, Patient/Family education, Balance training, Stair training, Taping, Dry Needling, Joint mobilization, Joint manipulation, Spinal manipulation, Spinal mobilization, Cryotherapy, and Moist heat   PLAN FOR NEXT SESSION: Decompression traction, lumbar mobility, gentle extension if tolerated, anterior stretching and posterior strengthening Trial percussion gun to low back  4:02 PM, 07/05/23 Tabitha Ewings, DPT Physical Therapy with [place]

## 2023-07-11 ENCOUNTER — Encounter: Payer: Self-pay | Admitting: Physical Therapy

## 2023-07-11 ENCOUNTER — Ambulatory Visit: Admitting: Physical Therapy

## 2023-07-11 DIAGNOSIS — M5459 Other low back pain: Secondary | ICD-10-CM

## 2023-07-11 DIAGNOSIS — M6281 Muscle weakness (generalized): Secondary | ICD-10-CM | POA: Diagnosis not present

## 2023-07-11 NOTE — Therapy (Signed)
 OUTPATIENT PHYSICAL THERAPY THORACOLUMBAR TREATMENT   Patient Name: Stephen Oconnell MRN: [id number] DOB:10/10/1945, 78 y.o., male Today's Date: 07/11/2023  END OF SESSION:  PT End of Session - 07/11/23 1604     Visit Number 3    Number of Visits 16    Date for PT Re-Evaluation 09/21/23    Authorization Type [employer] Medicare - [employer] requested 6 initial visits approved for 8 weeks    Authorization - Visit Number 3    Authorization - Number of Visits 6    Progress Note Due on Visit 10    PT Start Time 1604    PT Stop Time 1642    PT Time Calculation (min) 38 min    Activity Tolerance Patient tolerated treatment well    Behavior During Therapy WFL for tasks assessed/performed             Past Medical History:  Diagnosis Date   Allergies    Asthma    on inhaler   Constipation    COPD (chronic obstructive pulmonary disease) (HCC)    Emphysema lung (HCC)    GERD (gastroesophageal reflux disease)    Glaucoma    right eye   Hearing loss    Bil/has hearing aids   HTN (hypertension)    Leg swelling    Migraines    Obesity    OSA on CPAP    Skin cancer of forehead    Past Surgical History:  Procedure Laterality Date   CATARACT EXTRACTION     MOHS SURGERY     WISDOM TOOTH EXTRACTION     Patient Active Problem List   Diagnosis Date Noted   Daytime somnolence 03/23/2023   Other hyperlipidemia 08/10/2022   Vitamin D  deficiency 04/20/2022   BMI 34.0-34.9,adult 04/20/2022   Obesity, Beginning BMI 36.62 04/20/2022   Eating disorder 02/09/2022   SOB (shortness of breath) on exertion 10/13/2021   Insulin  resistance 10/13/2021   Essential hypertension 09/14/2021   Mixed hyperlipidemia 10/21/2020   Pre-diabetes 05/21/2020   Class 2 severe obesity with serious comorbidity and body mass index (BMI) of 36.0 to 36.9 in adult (HCC) 05/21/2020   Allergic rhinitis 04/18/2020   Allergic rhinitis due to animal (cat) (dog) hair and dander 04/18/2020   Allergic rhinitis due to pollen  04/18/2020   Chronic allergic conjunctivitis 04/18/2020   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   Localized, primary osteoarthritis 04/18/2020   Low back pain 04/18/2020   Mild intermittent asthma 04/18/2020   Osteoarthritis of knee 04/18/2020   Excessive cerumen in ear canal, bilateral 01/10/2018   Sensorineural hearing loss (SNHL), 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    PCP: Barnetta Liberty, MD  REFERRING PROVIDER: Syliva Even, MD   REFERRING DIAG: M54.50,G89.29 (ICD-10-CM) - Chronic low back pain, unspecified back pain laterality, unspecified whether sciatica present  Rationale for Evaluation and Treatment: Rehabilitation  THERAPY DIAG:  Other low back pain  Muscle weakness (generalized)  ONSET DATE: one year ago  SUBJECTIVE:  SUBJECTIVE STATEMENT: 07/11/2023 No pain today but stiffness in back yesterday.    EVAL: States he has had back pain that has increased over the last year. States that he is walking more forward and whenever he goes for  a walk after about a block he starts having pain. Pain is just in the back. No injections or HEP yet.   Reports he also notices that he has bilateral hip pain on the sides of his hip but does not think that is related  PERTINENT HISTORY:  COPD, vagal nerve reaction history, body migraines, bilateral hip pain PAIN:  Are you having pain? Yes: NPRS scale: 0/10 Pain location: center of low back  Pain description: discomfort Aggravating factors: walking Relieving factors: sitting  PRECAUTIONS: None  RED FLAGS: None   WEIGHT BEARING RESTRICTIONS: No  FALLS:  Has patient fallen in last 6 months? No    OCCUPATION: Likes to [employer]  PLOF: Independent  PATIENT GOALS: Less pain  OBJECTIVE:  Note: Objective measures were completed at Evaluation  unless otherwise noted.  DIAGNOSTIC FINDINGS:  06/09/23 lumbar xray   FINDINGS: There is no evidence of lumbar spine fracture. Alignment is normal. Moderate narrow intervertebral space with grade 1 anterolisthesis of L5 on S1. Severe facet joint degenerative changes at L5-S1. Anterior spurring is identified throughout the thoracic and lumbar spine with relative sparing of L5.   IMPRESSION: Degenerative joint changes of lumbar spine.  PATIENT SURVEYS:  Patient-specific activity functional scoring scheme (Point to one number):  "0" represents "unable to perform." "10" represents "able to perform at prior level. 0 1 2 3 4 5 6 7 8 9  10 (Date and Score) Activity Initial  Activity Eval     Walking a block  5     Mowing the lawn  2    Lifting items 5    Additional Additional Total score = sum of the activity scores/number of activities Minimum detectable change (90%CI) for average score = 2 points Minimum detectable change (90%CI) for single activity score = 3 points PSFS developed by: [place] Spitz., & Binkley, J. (1995). Assessing disability and change on individual  patients: a report of a patient specific measure. Physiotherapy [place], 47, [phone]. Reproduced with the permission of the authors  Score: 12/3=4   COGNITION: Overall cognitive status: Within functional limits for tasks assessed     SENSATION: Not tested    POSTURE: rounded shoulders, forward head, and flexed trunk   PALPATION: No tenderness to palpation but increased resting tone noted in bilateral lumbar paraspinals and hypomobility noted in lumbar spine  LUMBAR ROM:   AROM eval  Flexion 25% limited  Extension 90% limited*  Right lateral flexion 50% limited  Left lateral flexion 50% limited  Right rotation   Left rotation    (Blank rows = not tested)    LE Measurements Lower Extremity Right EVAL Left EVAL   A/PROM MMT A/PROM MMT  Hip Flexion      Hip Extension       Hip Abduction      Hip Adduction      Hip Internal rotation   40*   Hip External rotation   45*   Knee Flexion      Knee Extension      Ankle Dorsiflexion      Ankle Plantarflexion      Ankle Inversion      Ankle Eversion       (Blank rows = not tested) * pain  LUMBAR SPECIAL TESTS:  Slump negative Ely's +  B   TREATMENT DATE:                                                                                                                               07/11/2023  Therapeutic Exercise:  Aerobic: Supine: thomas stretch with multiple height table to mimic home environment 8 minutes,  bridges with posterior pelvic tilt x20 5" holds Prone: lying 3 minutes, hamstring curl x15 B slow and controlled with wedge in hip for tactile cues   Seated:lumbar traction - stopped 3 minutes  Standing: lumbar traction L stretch - not tolerated stopped, lumbar traction hanging x2 rounds of 2 minutes - tolerated well  Neuromuscular Re-education: long exhale breathing with zippering focus on core activation 10 minutes Manual Therapy:  Therapeutic Activity: Self Care:   Trigger Point Dry Needling:  Modalities:     PATIENT EDUCATION:  Education details: on  HEP, on anatomy and rationale behind interventions  Person educated: Patient Education method: Explanation, Demonstration, and Handouts Education comprehension: verbalized understanding   HOME EXERCISE PROGRAM: [id number] Long exhale breathing with zippering focus ASSESSMENT:  CLINICAL IMPRESSION: 07/11/2023 Focused on review of HEP and answering all questions/ making modifications as needed. Added traction exercises to HEP as well as breathing exercises which were tolerated well. Overall patient doing well and will continue to benefit from skilled PT at this time.    EVAL: Patient presents to physical therapy with complaints of chronic low back pain and diagnosis of stenosis.  Patient presents with limited range of motion, strength and  overall posture that is contributing to current presentation.  Session focused on education as well as plan moving forward.  Patient would greatly benefit from skilled PT to address physical impairments and improve overall function and quality of life.  OBJECTIVE IMPAIRMENTS: decreased ROM, decreased strength, postural dysfunction, and pain.   ACTIVITY LIMITATIONS: carrying, lifting, standing, transfers, and locomotion level  PARTICIPATION LIMITATIONS: meal prep, cleaning, and woodworking  PERSONAL FACTORS: Fitness and Time since onset of injury/illness/exacerbation are also affecting patient's functional outcome.   REHAB POTENTIAL: Good  CLINICAL DECISION MAKING: Stable/uncomplicated  EVALUATION COMPLEXITY: Low   GOALS: Goals reviewed with patient? yes  SHORT TERM GOALS: Target date: 08/10/2023 Patient will be independent in self management strategies to improve quality of life and functional outcomes. Baseline: New Program Goal status: INITIAL  2.  Patient will report at least 50% improvement in overall symptoms and/or function to demonstrate improved functional mobility Baseline: 0% better Goal status: INITIAL  3.  Patient will be able to walk 1 block without severe pain and back to improve functional mobility in the community Baseline: Unable     LONG TERM GOALS: Target date: 09/21/2023   Patient will report at least 75% improvement in overall symptoms and/or function to demonstrate improved functional mobility Baseline: 0% better Goal status: INITIAL  2.  Patient will score at least 2 points higher on  PSFS average to demonstrate change in overall function. Baseline: see above Goal status: INITIAL  3.  Patient will be able to walk 3 blocks without severe pain to improve functional mobility in the community Baseline: Unable Goal status: INITIAL    PLAN:  PT FREQUENCY: 1-2x/week for total of 16 visits over 12 weeks certification.  PT DURATION: 12 weeks  PLANNED  INTERVENTIONS: 97110-Therapeutic exercises, 97530- Therapeutic activity, [id number]- Neuromuscular re-education, [phone]- Self Care, 19147- Manual therapy, [phone]- Gait training, [phone]- Orthotic Fit/training, [phone]- Canalith repositioning, [phone]- Aquatic Therapy, 97014- Electrical stimulation (unattended), [phone]- Ionotophoresis 4mg /ml Dexamethasone, Patient/Family education, Balance training, Stair training, Taping, Dry Needling, Joint mobilization, Joint manipulation, Spinal manipulation, Spinal mobilization, Cryotherapy, and Moist heat   PLAN FOR NEXT SESSION: Decompression traction, lumbar mobility, gentle extension if tolerated, anterior stretching and posterior strengthening Trial percussion gun to low back  4:53 PM, 07/11/23 Tabitha Ewings, DPT Physical Therapy with [facility]

## 2023-07-22 NOTE — Progress Notes (Signed)
   I, Miquel Amen, CMA acting as a scribe for Garlan Juniper, MD.  Stephen Oconnell is a 78 y.o. male who presents to [facility] at [facility] today for chronic bilateral lower back pain. Pt was last seen by Dr. Alease Hunter on 06/08/23 for chronic lower back pain. XR of the lower back was obtained and a referral was placed for PT with [facility], completing 3 visits.   Today, patient reports back is better today.  Diagnostic Imaging: 06/08/23 - XR L-Spine 03/29/22 - CT L-spine and CT ri actually where ght hip  Pertinent review of systems: No fevers or chills  Relevant historical information: Hypertension   Exam:  BP (!) 140/60   Pulse 74   Ht 5\' 9"  (1.753 m)   Wt 242 lb (109.8 kg)   SpO2 95%   BMI 35.74 kg/m  General: Well Developed, well nourished, and in no acute distress.   MSK: L-spine: Normal appearing Nontender palpation midline. Normal lumbar motion.    Lab and Radiology Results  DG Lumbar Spine 2-3 Views Result Date: 06/09/2023 CLINICAL DATA:  Status post fall 1 year ago with low back pain. EXAM: LUMBAR SPINE - 2-3 VIEW COMPARISON:  CT lumbar spine March 29, 2022 FINDINGS: There is no evidence of lumbar spine fracture. Alignment is normal. Moderate narrow intervertebral space with grade 1 anterolisthesis of L5 on S1. Severe facet joint degenerative changes at L5-S1. Anterior spurring is identified throughout the thoracic and lumbar spine with relative sparing of L5. IMPRESSION: Degenerative joint changes of lumbar spine. Electronically Signed   By: Anna Barnes M.D.   On: 06/09/2023 16:29   I, Garlan Juniper, personally (independently) visualized and performed the interpretation of the images attached in this note.      Assessment and Plan: 78 y.o. male with chronic low back pain.  Improving with physical therapy.  Plan to continue PT and home exercise program.  Check back as needed.  Next step if needed would be MRI lumbar spine.   PDMP not  reviewed this encounter. No orders of the defined types were placed in this encounter.  No orders of the defined types were placed in this encounter.    Discussed warning signs or symptoms. Please see discharge instructions. Patient expresses understanding.   The above documentation has been reviewed and is accurate and complete Garlan Juniper, M.D.

## 2023-07-25 ENCOUNTER — Encounter (INDEPENDENT_AMBULATORY_CARE_PROVIDER_SITE_OTHER): Payer: Self-pay | Admitting: Family Medicine

## 2023-07-25 ENCOUNTER — Ambulatory Visit (INDEPENDENT_AMBULATORY_CARE_PROVIDER_SITE_OTHER): Admitting: Family Medicine

## 2023-07-25 ENCOUNTER — Ambulatory Visit: Admitting: Family Medicine

## 2023-07-25 ENCOUNTER — Encounter: Admitting: Physical Therapy

## 2023-07-25 ENCOUNTER — Encounter: Payer: Self-pay | Admitting: Family Medicine

## 2023-07-25 VITALS — BP 139/67 | HR 60 | Temp 97.5°F | Ht 69.0 in | Wt 233.0 lb

## 2023-07-25 VITALS — BP 140/60 | HR 74 | Ht 69.0 in | Wt 242.0 lb

## 2023-07-25 DIAGNOSIS — G8929 Other chronic pain: Secondary | ICD-10-CM | POA: Diagnosis not present

## 2023-07-25 DIAGNOSIS — E66812 Obesity, class 2: Secondary | ICD-10-CM

## 2023-07-25 DIAGNOSIS — I10 Essential (primary) hypertension: Secondary | ICD-10-CM

## 2023-07-25 DIAGNOSIS — Z6834 Body mass index (BMI) 34.0-34.9, adult: Secondary | ICD-10-CM | POA: Diagnosis not present

## 2023-07-25 DIAGNOSIS — E669 Obesity, unspecified: Secondary | ICD-10-CM

## 2023-07-25 DIAGNOSIS — F5089 Other specified eating disorder: Secondary | ICD-10-CM | POA: Diagnosis not present

## 2023-07-25 DIAGNOSIS — M545 Low back pain, unspecified: Secondary | ICD-10-CM | POA: Diagnosis not present

## 2023-07-25 DIAGNOSIS — R7303 Prediabetes: Secondary | ICD-10-CM

## 2023-07-25 MED ORDER — METFORMIN HCL 500 MG PO TABS: 500.0000 mg | ORAL_TABLET | Freq: Two times a day (BID) | ORAL | 0 refills | Status: DC

## 2023-07-25 NOTE — Progress Notes (Signed)
 Office: [phone]  /  Fax: [phone]  WEIGHT SUMMARY AND BIOMETRICS  Anthropometric Measurements Height: 5\' 9"  (1.753 m) Weight: 233 lb (105.7 kg) BMI (Calculated): 34.39 Weight at Last Visit: 236 lb Weight Lost Since Last Visit: 3 lb Weight Gained Since Last Visit: 0 Starting Weight: 248 lb Total Weight Loss (lbs): 15 lb (6.804 kg) Peak Weight: 258 lb   Body Composition  Body Fat %: 32.5 % Fat Mass (lbs): 76 lbs Muscle Mass (lbs): 149.8 lbs Total Body Water (lbs): 109.2 lbs Visceral Fat Rating : 21   Other Clinical Data Fasting: yes Labs: no Today's Visit #: 55 Starting Date: 12/20/19    Chief Complaint: OBESITY   Discussed the use of AI scribe software for clinical note transcription with the patient, who gave verbal consent to proceed.  History of Present Illness Stephen Oconnell is a 78 year old male with obesity and prediabetes who presents for obesity treatment follow-up.  He is adhering to a prescribed category three plan for obesity management approximately 75% of the time. He engages in physical activity by walking for 40 to 50 minutes, three to four times a week, and has achieved a weight loss of three pounds over the past month. He was on a low-carb diet for three weeks, which he discontinued last week, noting a reduction in cravings during that period. He intends to continue with the category three plan for a few more weeks before potentially resuming the low-carb diet.  He has opted not to use topiramate  and has discontinued bupropion  for about three weeks, observing no significant changes since stopping it. He is currently taking metformin  for prediabetes. Additionally, he is on losartan and Maxzide for hypertension, with his blood pressure generally in the 130s, although it was 139 at this visit.  He participates in outdoor walking around his neighborhood, which includes some hills, and is contemplating the use of ankle weights or a weighted vest to  enhance the intensity of his exercise. He reports no significant challenges anticipated in the near future and has taken his medications this morning.      PHYSICAL EXAM:  Blood pressure 139/67, pulse 60, temperature (!) 97.5 F (36.4 C), height 5\' 9"  (1.753 m), weight 233 lb (105.7 kg), SpO2 97%. Body mass index is 34.41 kg/m.  DIAGNOSTIC DATA REVIEWED:  BMET    Component Value Date/Time   NA 142 03/23/2023 0843   K 4.2 03/23/2023 0843   CL 98 03/23/2023 0843   CO2 28 03/23/2023 0843   GLUCOSE 122 (H) 03/23/2023 0843   BUN 21 03/23/2023 0843   CREATININE 1.13 03/23/2023 0843   CALCIUM  9.5 03/23/2023 0843   GFRNONAA 57 (L) 04/08/2020 0801   GFRAA 65 04/08/2020 0801   Lab Results  Component Value Date   HGBA1C 5.6 03/23/2023   HGBA1C 6.0 (H) 12/20/2019   Lab Results  Component Value Date   INSULIN  23.5 03/23/2023   INSULIN  29.5 (H) 12/20/2019   Lab Results  Component Value Date   TSH 2.500 03/23/2023   CBC    Component Value Date/Time   WBC 5.7 03/23/2023 0843   RBC 4.91 03/23/2023 0843   HGB 16.1 03/23/2023 0843   HCT 47.1 03/23/2023 0843   PLT 216 03/23/2023 0843   MCV 96 03/23/2023 0843   MCH 32.8 03/23/2023 0843   MCHC 34.2 03/23/2023 0843   RDW 13.0 03/23/2023 0843   Iron Studies    Component Value Date/Time   IRON 122 03/23/2023 0843  TIBC 302 03/23/2023 0843   FERRITIN 196 03/23/2023 0843   IRONPCTSAT 40 03/23/2023 0843   Lipid Panel     Component Value Date/Time   CHOL 114 03/23/2023 0843   TRIG 72 03/23/2023 0843   HDL 52 03/23/2023 0843   LDLCALC 47 03/23/2023 0843   Hepatic Function Panel     Component Value Date/Time   PROT 7.4 03/23/2023 0843   ALBUMIN 4.5 03/23/2023 0843   AST 20 03/23/2023 0843   ALT 20 03/23/2023 0843   ALKPHOS 58 03/23/2023 0843   BILITOT 0.5 03/23/2023 0843      Component Value Date/Time   TSH 2.500 03/23/2023 0843   Nutritional Lab Results  Component Value Date   VD25OH 45.2 03/23/2023   VD25OH  45.9 10/21/2022   VD25OH 47.0 04/20/2022     Assessment and Plan Assessment & Plan Obesity Obesity management is ongoing with a structured category three plan. He adheres to the plan 75% of the time and has lost 3 pounds in the last month. He engages in regular physical activity, walking 40-50 minutes three to four times a week. He tried a low-carb diet for three weeks, which reduced cravings, but has returned to the category three plan. The plan is to continue with the category three diet for a couple of weeks and then possibly return to the low-carb diet. Discussed the option of using ankle weights or a weighted vest to increase strengthening during walks without additional time commitment. - Continue category three diet plan - Engage in regular walking exercise 40-50 minutes three to four times a week - Consider using ankle weights or weighted vest for increased strengthening during walks  Emotional eating behaviors Emotional eating behaviors are being addressed through dietary management and exercise. Bupropion  was discontinued three weeks ago, and he reports no noticeable difference since stopping the medication. Shared decision-making led to the agreement to discontinue bupropion  permanently. - Discontinue bupropion  - follow up in 1 month to assess his EEB   Hypertension Hypertension is well-controlled with a blood pressure reading of 139/80. He is on losartan and Maxzide. - Continue losartan - Continue Maxzide - Continue diet and exercise   He was informed of the importance of frequent follow up visits to maximize his success with intensive lifestyle modifications for his multiple health conditions.    Jasmine Mesi, MD

## 2023-07-25 NOTE — Patient Instructions (Signed)
 Thank you for coming in today.   Continue PT and home exercises.   Recheck as needed.

## 2023-08-02 ENCOUNTER — Encounter: Payer: Self-pay | Admitting: Physical Therapy

## 2023-08-02 ENCOUNTER — Ambulatory Visit: Admitting: Physical Therapy

## 2023-08-02 DIAGNOSIS — M6281 Muscle weakness (generalized): Secondary | ICD-10-CM

## 2023-08-02 DIAGNOSIS — M5459 Other low back pain: Secondary | ICD-10-CM | POA: Diagnosis not present

## 2023-08-02 NOTE — Therapy (Signed)
 OUTPATIENT PHYSICAL THERAPY THORACOLUMBAR TREATMENT   Patient Name: Stephen Oconnell MRN: [id number] DOB:1945/10/29, 78 y.o., male Today's Date: 08/02/2023  END OF SESSION:  PT End of Session - 08/02/23 1600     Visit Number 4    Number of Visits 16    Date for PT Re-Evaluation 09/21/23    Authorization Type [employer] - [employer] requested 6 initial visits approved for 8 weeks    Authorization - Visit Number 4    Authorization - Number of Visits 6    Progress Note Due on Visit 10    PT Start Time 1601    PT Stop Time 1644    PT Time Calculation (min) 43 min    Activity Tolerance Patient tolerated treatment well    Behavior During Therapy WFL for tasks assessed/performed             Past Medical History:  Diagnosis Date   Allergies    Asthma    on inhaler   Constipation    COPD (chronic obstructive pulmonary disease) (HCC)    Emphysema lung (HCC)    GERD (gastroesophageal reflux disease)    Glaucoma    right eye   Hearing loss    Bil/has hearing aids   HTN (hypertension)    Leg swelling    Migraines    Obesity    OSA on CPAP    Skin cancer of forehead    Past Surgical History:  Procedure Laterality Date   CATARACT EXTRACTION     MOHS SURGERY     WISDOM TOOTH EXTRACTION     Patient Active Problem List   Diagnosis Date Noted   Daytime somnolence 03/23/2023   Other hyperlipidemia 08/10/2022   Vitamin D  deficiency 04/20/2022   BMI 34.0-34.9,adult 04/20/2022   Obesity, Beginning BMI 36.62 04/20/2022   Eating disorder 02/09/2022   SOB (shortness of breath) on exertion 10/13/2021   Insulin  resistance 10/13/2021   Essential hypertension 09/14/2021   Mixed hyperlipidemia 10/21/2020   Pre-diabetes 05/21/2020   Class 2 severe obesity with serious comorbidity and body mass index (BMI) of 36.0 to 36.9 in adult (HCC) 05/21/2020   Allergic rhinitis 04/18/2020   Allergic rhinitis due to animal (cat) (dog) hair and dander 04/18/2020   Allergic rhinitis due to pollen  04/18/2020   Chronic allergic conjunctivitis 04/18/2020   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   Localized, primary osteoarthritis 04/18/2020   Low back pain 04/18/2020   Mild intermittent asthma 04/18/2020   Osteoarthritis of knee 04/18/2020   Excessive cerumen in ear canal, bilateral 01/10/2018   Sensorineural hearing loss (SNHL), 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    PCP: Barnetta Liberty, MD  REFERRING PROVIDER: Syliva Even, MD   REFERRING DIAG: M54.50,G89.29 (ICD-10-CM) - Chronic low back pain, unspecified back pain laterality, unspecified whether sciatica present  Rationale for Evaluation and Treatment: Rehabilitation  THERAPY DIAG:  Other low back pain  Muscle weakness (generalized)  ONSET DATE: one year ago  SUBJECTIVE:  SUBJECTIVE STATEMENT: 08/02/2023 Occasionally twinging but no real pain.    EVAL: States he has had back pain that has increased over the last year. States that he is walking more forward and whenever he goes for  a walk after about a block he starts having pain. Pain is just in the back. No injections or HEP yet.   Reports he also notices that he has bilateral hip pain on the sides of his hip but does not think that is related  PERTINENT HISTORY:  COPD, vagal nerve reaction history, body migraines, bilateral hip pain PAIN:  Are you having pain? Yes: NPRS scale: 0/10 Pain location: center of low back  Pain description: discomfort Aggravating factors: walking Relieving factors: sitting  PRECAUTIONS: None  RED FLAGS: None   WEIGHT BEARING RESTRICTIONS: No  FALLS:  Has patient fallen in last 6 months? No    OCCUPATION: Likes to [employer]  PLOF: Independent  PATIENT GOALS: Less pain  OBJECTIVE:  Note: Objective measures were completed at Evaluation unless  otherwise noted.  DIAGNOSTIC FINDINGS:  06/09/23 lumbar xray   FINDINGS: There is no evidence of lumbar spine fracture. Alignment is normal. Moderate narrow intervertebral space with grade 1 anterolisthesis of L5 on S1. Severe facet joint degenerative changes at L5-S1. Anterior spurring is identified throughout the thoracic and lumbar spine with relative sparing of L5.   IMPRESSION: Degenerative joint changes of lumbar spine.  PATIENT SURVEYS:  Patient-specific activity functional scoring scheme (Point to one number):  "0" represents "unable to perform." "10" represents "able to perform at prior level. 0 1 2 3 4 5 6 7 8 9  10 (Date and Score) Activity Initial  Activity Eval     Walking a block  5     Mowing the lawn  2    Lifting items 5    Additional Additional Total score = sum of the activity scores/number of activities Minimum detectable change (90%CI) for average score = 2 points Minimum detectable change (90%CI) for single activity score = 3 points PSFS developed by: [place] Spitz., & Binkley, J. (1995). Assessing disability and change on individual  patients: a report of a patient specific measure. Physiotherapy [place], 47, [phone]. Reproduced with the permission of the authors  Score: 12/3=4   COGNITION: Overall cognitive status: Within functional limits for tasks assessed     SENSATION: Not tested    POSTURE: rounded shoulders, forward head, and flexed trunk   PALPATION: No tenderness to palpation but increased resting tone noted in bilateral lumbar paraspinals and hypomobility noted in lumbar spine  LUMBAR ROM:   AROM eval  Flexion 25% limited  Extension 90% limited*  Right lateral flexion 50% limited  Left lateral flexion 50% limited  Right rotation   Left rotation    (Blank rows = not tested)    LE Measurements Lower Extremity Right EVAL Left EVAL   A/PROM MMT A/PROM MMT  Hip Flexion      Hip Extension      Hip  Abduction      Hip Adduction      Hip Internal rotation   40*   Hip External rotation   45*   Knee Flexion      Knee Extension      Ankle Dorsiflexion      Ankle Plantarflexion      Ankle Inversion      Ankle Eversion       (Blank rows = not tested) * pain   LUMBAR  SPECIAL TESTS:  Slump negative Ely's +  B   TREATMENT DATE:                                                                                                                               08/02/2023  Therapeutic Exercise: On how and when to perform exercises  Supine: LTR 2 minutes, DKC 2 minutes, review of entire HEP and answering all questions, quad hold x4 5" holds, plank on arms straight --> on elbows 30 second max hold Prone   Seated:   Standing:   Neuromuscular Re-education: long exhale breathing with focus on core activation 12 minutes Manual Therapy:  Therapeutic Activity: Self Care:   Trigger Point Dry Needling:  Modalities:     PATIENT EDUCATION:  Education details: on  HEP, on anatomy and rationale behind interventions  Person educated: Patient Education method: Explanation, Demonstration, and Handouts Education comprehension: verbalized understanding   HOME EXERCISE PROGRAM: [id number] Long exhale breathing with zippering focus ASSESSMENT:  CLINICAL IMPRESSION: 08/02/2023 Reviewed HEP and answered all questions. Breathing exercises challenging as patient increases intraabdominal pressure with this. Tolerated cue to engage core as if someone is going to punch him or tickle him best. Tightness after planks but this reduced with mobility work. Will continue with current POC as tolerated.    EVAL: Patient presents to physical therapy with complaints of chronic low back pain and diagnosis of stenosis.  Patient presents with limited range of motion, strength and overall posture that is contributing to current presentation.  Session focused on education as well as plan moving forward.  Patient would  greatly benefit from skilled PT to address physical impairments and improve overall function and quality of life.  OBJECTIVE IMPAIRMENTS: decreased ROM, decreased strength, postural dysfunction, and pain.   ACTIVITY LIMITATIONS: carrying, lifting, standing, transfers, and locomotion level  PARTICIPATION LIMITATIONS: meal prep, cleaning, and woodworking  PERSONAL FACTORS: Fitness and Time since onset of injury/illness/exacerbation are also affecting patient's functional outcome.   REHAB POTENTIAL: Good  CLINICAL DECISION MAKING: Stable/uncomplicated  EVALUATION COMPLEXITY: Low   GOALS: Goals reviewed with patient? yes  SHORT TERM GOALS: Target date: 08/10/2023 Patient will be independent in self management strategies to improve quality of life and functional outcomes. Baseline: New Program Goal status: INITIAL  2.  Patient will report at least 50% improvement in overall symptoms and/or function to demonstrate improved functional mobility Baseline: 0% better Goal status: INITIAL  3.  Patient will be able to walk 1 block without severe pain and back to improve functional mobility in the community Baseline: Unable     LONG TERM GOALS: Target date: 09/21/2023   Patient will report at least 75% improvement in overall symptoms and/or function to demonstrate improved functional mobility Baseline: 0% better Goal status: INITIAL  2.  Patient will score at least 2 points higher on PSFS average to demonstrate change in overall function. Baseline: see above Goal status: INITIAL  3.  Patient will  be able to walk 3 blocks without severe pain to improve functional mobility in the community Baseline: Unable Goal status: INITIAL    PLAN:  PT FREQUENCY: 1-2x/week for total of 16 visits over 12 weeks certification.  PT DURATION: 12 weeks  PLANNED INTERVENTIONS: 97110-Therapeutic exercises, 97530- Therapeutic activity, [id number]- Neuromuscular re-education, [phone]- Self Care, 91478-  Manual therapy, [phone]- Gait training, [phone]- Orthotic Fit/training, [phone]- Canalith repositioning, [phone]- Aquatic Therapy, [id number]- Electrical stimulation (unattended), [phone]- Ionotophoresis 4mg /ml Dexamethasone, Patient/Family education, Balance training, Stair training, Taping, Dry Needling, Joint mobilization, Joint manipulation, Spinal manipulation, Spinal mobilization, Cryotherapy, and Moist heat   PLAN FOR NEXT SESSION: Decompression traction, lumbar mobility, gentle extension if tolerated, anterior stretching and posterior strengthening Trial percussion gun to low back  4:49 PM, 08/02/23 Tabitha Ewings, DPT Physical Therapy with [facility]

## 2023-08-08 ENCOUNTER — Other Ambulatory Visit (INDEPENDENT_AMBULATORY_CARE_PROVIDER_SITE_OTHER): Payer: Self-pay | Admitting: Family Medicine

## 2023-08-08 DIAGNOSIS — F5089 Other specified eating disorder: Secondary | ICD-10-CM

## 2023-08-09 ENCOUNTER — Encounter: Payer: Self-pay | Admitting: Physical Therapy

## 2023-08-09 ENCOUNTER — Ambulatory Visit: Admitting: Physical Therapy

## 2023-08-09 DIAGNOSIS — M5459 Other low back pain: Secondary | ICD-10-CM

## 2023-08-09 DIAGNOSIS — M6281 Muscle weakness (generalized): Secondary | ICD-10-CM | POA: Diagnosis not present

## 2023-08-09 NOTE — Therapy (Signed)
 OUTPATIENT PHYSICAL THERAPY THORACOLUMBAR TREATMENT   Patient Name: Stephen Oconnell MRN: [id number] DOB:1945-04-23, 78 y.o., male Today's Date: 08/09/2023  END OF SESSION:  PT End of Session - 08/09/23 1512     Visit Number 5    Number of Visits 16    Date for PT Re-Evaluation 09/21/23    Authorization Type [employer] Medicare - [employer] 06/29/2023 - 09/21/2023  Number of visits: 16 Visit(s)    Authorization - Visit Number 5    Authorization - Number of Visits 16    Progress Note Due on Visit 10    PT Start Time 1514    PT Stop Time 1554    PT Time Calculation (min) 40 min    Activity Tolerance Patient tolerated treatment well    Behavior During Therapy WFL for tasks assessed/performed             Past Medical History:  Diagnosis Date   Allergies    Asthma    on inhaler   Constipation    COPD (chronic obstructive pulmonary disease) (HCC)    Emphysema lung (HCC)    GERD (gastroesophageal reflux disease)    Glaucoma    right eye   Hearing loss    Bil/has hearing aids   HTN (hypertension)    Leg swelling    Migraines    Obesity    OSA on CPAP    Skin cancer of forehead    Past Surgical History:  Procedure Laterality Date   CATARACT EXTRACTION     MOHS SURGERY     WISDOM TOOTH EXTRACTION     Patient Active Problem List   Diagnosis Date Noted   Daytime somnolence 03/23/2023   Other hyperlipidemia 08/10/2022   Vitamin D  deficiency 04/20/2022   BMI 34.0-34.9,adult 04/20/2022   Obesity, Beginning BMI 36.62 04/20/2022   Eating disorder 02/09/2022   SOB (shortness of breath) on exertion 10/13/2021   Insulin  resistance 10/13/2021   Essential hypertension 09/14/2021   Mixed hyperlipidemia 10/21/2020   Pre-diabetes 05/21/2020   Class 2 severe obesity with serious comorbidity and body mass index (BMI) of 36.0 to 36.9 in adult (HCC) 05/21/2020   Allergic rhinitis 04/18/2020   Allergic rhinitis due to animal (cat) (dog) hair and dander 04/18/2020   Allergic rhinitis due to  pollen 04/18/2020   Chronic allergic conjunctivitis 04/18/2020   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   Localized, primary osteoarthritis 04/18/2020   Low back pain 04/18/2020   Mild intermittent asthma 04/18/2020   Osteoarthritis of knee 04/18/2020   Excessive cerumen in ear canal, bilateral 01/10/2018   Sensorineural hearing loss (SNHL), 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    PCP: Barnetta Liberty, MD  REFERRING PROVIDER: Syliva Even, MD   REFERRING DIAG: M54.50,G89.29 (ICD-10-CM) - Chronic low back pain, unspecified back pain laterality, unspecified whether sciatica present  Rationale for Evaluation and Treatment: Rehabilitation  THERAPY DIAG:  Other low back pain  Muscle weakness (generalized)  ONSET DATE: one year ago  SUBJECTIVE:  SUBJECTIVE STATEMENT: 08/09/2023 Got a yoga mat and feeling better. States he is doing his planks at 30 seconds exercise. States overall he feels about 75% better. States on Saturday he was in a lot of discomfort with the rain. States he thinks it had to do with body migraines. Reports otherwise he feels general stiffness on Saturday.   EVAL: States he has had back pain that has increased over the last year. States that he is walking more forward and whenever he goes for  a walk after about a block he starts having pain. Pain is just in the back. No injections or HEP yet.   Reports he also notices that he has bilateral hip pain on the sides of his hip but does not think that is related  PERTINENT HISTORY:  COPD, vagal nerve reaction history, body migraines, bilateral hip pain PAIN:  Are you having pain? Yes: NPRS scale: 0/10 Pain location: center of low back  Pain description: discomfort Aggravating factors: walking Relieving factors: sitting  PRECAUTIONS:  None  RED FLAGS: None   WEIGHT BEARING RESTRICTIONS: No  FALLS:  Has patient fallen in last 6 months? No    OCCUPATION: Likes to [employer]  PLOF: Independent  PATIENT GOALS: Less pain  OBJECTIVE:  Note: Objective measures were completed at Evaluation unless otherwise noted.  DIAGNOSTIC FINDINGS:  06/09/23 lumbar xray   FINDINGS: There is no evidence of lumbar spine fracture. Alignment is normal. Moderate narrow intervertebral space with grade 1 anterolisthesis of L5 on S1. Severe facet joint degenerative changes at L5-S1. Anterior spurring is identified throughout the thoracic and lumbar spine with relative sparing of L5.   IMPRESSION: Degenerative joint changes of lumbar spine.  PATIENT SURVEYS:  Patient-specific activity functional scoring scheme (Point to one number):  "0" represents "unable to perform." "10" represents "able to perform at prior level. 0 1 2 3 4 5 6 7 8 9  10 (Date and Score) Activity Initial  Activity Eval   6/3  Walking a block  5  10   Mowing the lawn  2  6  Lifting items 5 6   Additional Additional Total score = sum of the activity scores/number of activities Minimum detectable change (90%CI) for average score = 2 points Minimum detectable change (90%CI) for single activity score = 3 points PSFS developed by: [place] Spitz., & Binkley, J. (1995). Assessing disability and change on individual  patients: a report of a patient specific measure. Physiotherapy [place], 47, [phone]. Reproduced with the permission of the authors  Score: EVAL: 12/3=4; 6/3: 22/3=7.33   COGNITION: Overall cognitive status: Within functional limits for tasks assessed     SENSATION: Not tested    POSTURE: rounded shoulders, forward head, and flexed trunk   PALPATION: No tenderness to palpation but increased resting tone noted in bilateral lumbar paraspinals and hypomobility noted in lumbar spine  LUMBAR ROM:   AROM 6/3  Flexion  25% limited  Extension 90% limited  Right lateral flexion 50% limited*  Left lateral flexion 50% limited  Right rotation   Left rotation    (Blank rows = not tested) *pain    LE Measurements Lower Extremity Right 6/3 Left 6/3   A/PROM MMT A/PROM MMT  Hip Flexion      Hip Extension  4  4  Hip Abduction      Hip Adduction      Hip Internal rotation 45  40   Hip External rotation 45*  45   Knee  Flexion      Knee Extension      Ankle Dorsiflexion      Ankle Plantarflexion      Ankle Inversion      Ankle Eversion       (Blank rows = not tested) * pain   LUMBAR SPECIAL TESTS:  Slump negative Ely's +  B   TREATMENT DATE:                                                                                                                               08/09/2023  Therapeutic Exercise: Objective measures updated, reviewed HEP and goals Supine:   Prone   Seated:   Standing:   Neuromuscular Re-education:  Manual Therapy:  Therapeutic Activity:STS - progressing lower heights we went to 17.5 inch height  15 minutes total - slow and controlled to aid with squatting to pick up item  Self Care:  on migraine/stiffness management with weather changes - deep heat/mobility/taking prescribed medication as indicated by MD Trigger Point Dry Needling:  Modalities:     PATIENT EDUCATION:  Education details: on  HEP, on anatomy and rationale behind interventions  Person educated: Patient Education method: Programmer, multimedia, Demonstration, and Handouts Education comprehension: verbalized understanding   HOME EXERCISE PROGRAM: [id number] Long exhale breathing with zippering focus ASSESSMENT:  CLINICAL IMPRESSION: 08/09/2023 Overall patient doing well and has met all but one goal. Focused on general strength and mobility secondary to chronic stiffness. Educated patient on migraine symptoms as well as strategies to help with stiffness with weather change. Will continue with current POC as  tolerated.    EVAL: Patient presents to physical therapy with complaints of chronic low back pain and diagnosis of stenosis.  Patient presents with limited range of motion, strength and overall posture that is contributing to current presentation.  Session focused on education as well as plan moving forward.  Patient would greatly benefit from skilled PT to address physical impairments and improve overall function and quality of life.  OBJECTIVE IMPAIRMENTS: decreased ROM, decreased strength, postural dysfunction, and pain.   ACTIVITY LIMITATIONS: carrying, lifting, standing, transfers, and locomotion level  PARTICIPATION LIMITATIONS: meal prep, cleaning, and woodworking  PERSONAL FACTORS: Fitness and Time since onset of injury/illness/exacerbation are also affecting patient's functional outcome.   REHAB POTENTIAL: Good  CLINICAL DECISION MAKING: Stable/uncomplicated  EVALUATION COMPLEXITY: Low   GOALS: Goals reviewed with patient? yes  SHORT TERM GOALS: Target date: 08/10/2023 Patient will be independent in self management strategies to improve quality of life and functional outcomes. Baseline: New Program Goal status: INITIAL  2.  Patient will report at least 50% improvement in overall symptoms and/or function to demonstrate improved functional mobility Baseline: 0% better Goal status: MET  3.  Patient will be able to walk 1 block without severe pain and back to improve functional mobility in the community Baseline: MET     LONG TERM GOALS: Target date: 09/21/2023   Patient will report at  least 75% improvement in overall symptoms and/or function to demonstrate improved functional mobility Baseline: 0% better Goal status: MET  2.  Patient will score at least 2 points higher on PSFS average to demonstrate change in overall function. Baseline: see above Goal status: MET  3.  Patient will be able to walk 3 blocks without severe pain to improve functional mobility in the  community Baseline: Unable Goal status: MET    PLAN:  PT FREQUENCY: 1-2x/week for total of 16 visits over 12 weeks certification.  PT DURATION: 12 weeks  PLANNED INTERVENTIONS: 97110-Therapeutic exercises, 97530- Therapeutic activity, 97112- Neuromuscular re-education, [phone]- Self Care, 60454- Manual therapy, [phone]- Gait training, [phone]- Orthotic Fit/training, [phone]- Canalith repositioning, [phone]- Aquatic Therapy, 97014- Electrical stimulation (unattended), [phone]- Ionotophoresis 4mg /ml Dexamethasone, Patient/Family education, Balance training, Stair training, Taping, Dry Needling, Joint mobilization, Joint manipulation, Spinal manipulation, Spinal mobilization, Cryotherapy, and Moist heat   PLAN FOR NEXT SESSION: general mobility and strengthening to combat chronic tightness/stiffness  Decompression traction, lumbar mobility, gentle extension if tolerated, anterior stretching and posterior strengthening Trial percussion gun to low back  4:01 PM, 08/09/23 Tabitha Ewings, DPT Physical Therapy with [place]

## 2023-08-16 ENCOUNTER — Encounter: Admitting: Physical Therapy

## 2023-08-23 ENCOUNTER — Ambulatory Visit: Admitting: Physical Therapy

## 2023-08-23 ENCOUNTER — Encounter: Payer: Self-pay | Admitting: Physical Therapy

## 2023-08-23 DIAGNOSIS — M5459 Other low back pain: Secondary | ICD-10-CM

## 2023-08-23 DIAGNOSIS — M6281 Muscle weakness (generalized): Secondary | ICD-10-CM

## 2023-08-23 NOTE — Therapy (Signed)
 OUTPATIENT PHYSICAL THERAPY THORACOLUMBAR TREATMENT   Patient Name: Stephen Oconnell MRN: [id number] DOB:01/22/1946, 78 y.o., male Today's Date: 08/23/2023  END OF SESSION:  PT End of Session - 08/23/23 1516     Visit Number 6    Number of Visits 16    Date for PT Re-Evaluation 09/21/23    Authorization Type [employer] Medicare - [employer] 06/29/2023 - 09/21/2023  Number of visits: 16 Visit(s)    Authorization - Visit Number 6    Authorization - Number of Visits 16    Progress Note Due on Visit 10    PT Start Time 1516    PT Stop Time 1555    PT Time Calculation (min) 39 min    Activity Tolerance Patient tolerated treatment well    Behavior During Therapy WFL for tasks assessed/performed          Past Medical History:  Diagnosis Date   Allergies    Asthma    on inhaler   Constipation    COPD (chronic obstructive pulmonary disease) (HCC)    Emphysema lung (HCC)    GERD (gastroesophageal reflux disease)    Glaucoma    right eye   Hearing loss    Bil/has hearing aids   HTN (hypertension)    Leg swelling    Migraines    Obesity    OSA on CPAP    Skin cancer of forehead    Past Surgical History:  Procedure Laterality Date   CATARACT EXTRACTION     MOHS SURGERY     WISDOM TOOTH EXTRACTION     Patient Active Problem List   Diagnosis Date Noted   Daytime somnolence 03/23/2023   Other hyperlipidemia 08/10/2022   Vitamin D  deficiency 04/20/2022   BMI 34.0-34.9,adult 04/20/2022   Obesity, Beginning BMI 36.62 04/20/2022   Eating disorder 02/09/2022   SOB (shortness of breath) on exertion 10/13/2021   Insulin  resistance 10/13/2021   Essential hypertension 09/14/2021   Mixed hyperlipidemia 10/21/2020   Pre-diabetes 05/21/2020   Class 2 severe obesity with serious comorbidity and body mass index (BMI) of 36.0 to 36.9 in adult (HCC) 05/21/2020   Allergic rhinitis 04/18/2020   Allergic rhinitis due to animal (cat) (dog) hair and dander 04/18/2020   Allergic rhinitis due to pollen  04/18/2020   Chronic allergic conjunctivitis 04/18/2020   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   Localized, primary osteoarthritis 04/18/2020   Low back pain 04/18/2020   Mild intermittent asthma 04/18/2020   Osteoarthritis of knee 04/18/2020   Excessive cerumen in ear canal, bilateral 01/10/2018   Sensorineural hearing loss (SNHL), 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    PCP: Barnetta Liberty, MD  REFERRING PROVIDER: Syliva Even, MD   REFERRING DIAG: M54.50,G89.29 (ICD-10-CM) - Chronic low back pain, unspecified back pain laterality, unspecified whether sciatica present  Rationale for Evaluation and Treatment: Rehabilitation  THERAPY DIAG:  Other low back pain  Muscle weakness (generalized)  ONSET DATE: one year ago  SUBJECTIVE:  SUBJECTIVE STATEMENT: 08/23/2023 States overall he is doing better. He feels good with his exercises helps with the stiffness.    EVAL: States he has had back pain that has increased over the last year. States that he is walking more forward and whenever he goes for  a walk after about a block he starts having pain. Pain is just in the back. No injections or HEP yet.   Reports he also notices that he has bilateral hip pain on the sides of his hip but does not think that is related  PERTINENT HISTORY:  COPD, vagal nerve reaction history, body migraines, bilateral hip pain PAIN:  Are you having pain? Yes: NPRS scale: 0/10 Pain location: center of low back  Pain description: discomfort Aggravating factors: walking Relieving factors: sitting  PRECAUTIONS: None  RED FLAGS: None   WEIGHT BEARING RESTRICTIONS: No  FALLS:  Has patient fallen in last 6 months? No    OCCUPATION: Likes to [employer]  PLOF: Independent  PATIENT GOALS: Less pain  OBJECTIVE:  Note:  Objective measures were completed at Evaluation unless otherwise noted.  DIAGNOSTIC FINDINGS:  06/09/23 lumbar xray   FINDINGS: There is no evidence of lumbar spine fracture. Alignment is normal. Moderate narrow intervertebral space with grade 1 anterolisthesis of L5 on S1. Severe facet joint degenerative changes at L5-S1. Anterior spurring is identified throughout the thoracic and lumbar spine with relative sparing of L5.   IMPRESSION: Degenerative joint changes of lumbar spine.  PATIENT SURVEYS:  Patient-specific activity functional scoring scheme (Point to one number):  0 represents "unable to perform." 10 represents "able to perform at prior level. 0 1 2 3 4 5 6 7 8 9  10 (Date and Score) Activity Initial  Activity Eval   6/3  Walking a block  5  10   Mowing the lawn  2  6  Lifting items 5 6   Additional Additional Total score = sum of the activity scores/number of activities Minimum detectable change (90%CI) for average score = 2 points Minimum detectable change (90%CI) for single activity score = 3 points PSFS developed by: [place] Spitz., & Binkley, J. (1995). Assessing disability and change on individual  patients: a report of a patient specific measure. Physiotherapy [place], 47, [phone]. Reproduced with the permission of the authors  Score: EVAL: 12/3=4; 6/3: 22/3=7.33   COGNITION: Overall cognitive status: Within functional limits for tasks assessed     SENSATION: Not tested    POSTURE: rounded shoulders, forward head, and flexed trunk   PALPATION: No tenderness to palpation but increased resting tone noted in bilateral lumbar paraspinals and hypomobility noted in lumbar spine  LUMBAR ROM:   AROM 6/3  Flexion 25% limited  Extension 90% limited  Right lateral flexion 50% limited*  Left lateral flexion 50% limited  Right rotation   Left rotation    (Blank rows = not tested) *pain    LE Measurements Lower Extremity  Right 6/3 Left 6/3   A/PROM MMT A/PROM MMT  Hip Flexion      Hip Extension  4  4  Hip Abduction      Hip Adduction      Hip Internal rotation 45  40   Hip External rotation 45*  45   Knee Flexion      Knee Extension      Ankle Dorsiflexion      Ankle Plantarflexion      Ankle Inversion      Ankle Eversion       (  Blank rows = not tested) * pain   LUMBAR SPECIAL TESTS:  Slump negative Ely's +  B   TREATMENT DATE:                                                                                                                               08/23/2023  Therapeutic Exercise:  reviewed HEP  Supine:  LTR 2 MINUTES, DKC ball 2 minutes, SAQs 2 minutes B,  Piriformis 1 minute each leg Prone   Seated: lumbar ROT B 2 minutes,   Standing:  lumbar SB with ball 2 minutes B, Lumbar SB at wall 2 minutes, lumbar extension over ball 2 minutes - assisted by PT Neuromuscular Re-education: pallof press 2x10 B 2 blue bands, balance SLS  with over head press and one hand support x2 20 holds B Manual Therapy:  Therapeutic Activity: Self Care:   Trigger Point Dry Needling:  Modalities:     PATIENT EDUCATION:  Education details: on  HEP, on anatomy and rationale behind interventions  Person educated: Patient Education method: Explanation, Demonstration, and Handouts Education comprehension: verbalized understanding   HOME EXERCISE PROGRAM: [id number] Long exhale breathing with zippering focus ASSESSMENT:  CLINICAL IMPRESSION: 08/23/2023 Progressed and challenged patient today with new exercises. Did not add standing ball exercises to HEP but added all other exercises. No pain noted and slight fatigue end of session. Overall patient doing well and will continue to benefit form skilled PT at this time.    EVAL: Patient presents to physical therapy with complaints of chronic low back pain and diagnosis of stenosis.  Patient presents with limited range of motion, strength and overall posture  that is contributing to current presentation.  Session focused on education as well as plan moving forward.  Patient would greatly benefit from skilled PT to address physical impairments and improve overall function and quality of life.  OBJECTIVE IMPAIRMENTS: decreased ROM, decreased strength, postural dysfunction, and pain.   ACTIVITY LIMITATIONS: carrying, lifting, standing, transfers, and locomotion level  PARTICIPATION LIMITATIONS: meal prep, cleaning, and woodworking  PERSONAL FACTORS: Fitness and Time since onset of injury/illness/exacerbation are also affecting patient's functional outcome.   REHAB POTENTIAL: Good  CLINICAL DECISION MAKING: Stable/uncomplicated  EVALUATION COMPLEXITY: Low   GOALS: Goals reviewed with patient? yes  SHORT TERM GOALS: Target date: 08/10/2023 Patient will be independent in self management strategies to improve quality of life and functional outcomes. Baseline: New Program Goal status: INITIAL  2.  Patient will report at least 50% improvement in overall symptoms and/or function to demonstrate improved functional mobility Baseline: 0% better Goal status: MET  3.  Patient will be able to walk 1 block without severe pain and back to improve functional mobility in the community Baseline: MET     LONG TERM GOALS: Target date: 09/21/2023   Patient will report at least 75% improvement in overall symptoms and/or function to demonstrate improved functional mobility Baseline: 0% better Goal status: MET  2.  Patient will score at least 2 points higher on PSFS average to demonstrate change in overall function. Baseline: see above Goal status: MET  3.  Patient will be able to walk 3 blocks without severe pain to improve functional mobility in the community Baseline: Unable Goal status: MET    PLAN:  PT FREQUENCY: 1-2x/week for total of 16 visits over 12 weeks certification.  PT DURATION: 12 weeks  PLANNED INTERVENTIONS: 97110-Therapeutic  exercises, 97530- Therapeutic activity, 97112- Neuromuscular re-education, [phone]- Self Care, 11914- Manual therapy, [phone]- Gait training, [phone]- Orthotic Fit/training, [phone]- Canalith repositioning, [phone]- Aquatic Therapy, 97014- Electrical stimulation (unattended), [phone]- Ionotophoresis 4mg /ml Dexamethasone, Patient/Family education, Balance training, Stair training, Taping, Dry Needling, Joint mobilization, Joint manipulation, Spinal manipulation, Spinal mobilization, Cryotherapy, and Moist heat   PLAN FOR NEXT SESSION: general mobility and strengthening to combat chronic tightness/stiffness  Decompression traction, lumbar mobility, gentle extension if tolerated, anterior stretching and posterior strengthening Trial percussion gun to low back  3:56 PM, 08/23/23 Tabitha Ewings, DPT Physical Therapy with [place]

## 2023-08-25 ENCOUNTER — Encounter (INDEPENDENT_AMBULATORY_CARE_PROVIDER_SITE_OTHER): Payer: Self-pay | Admitting: Family Medicine

## 2023-08-25 ENCOUNTER — Ambulatory Visit (INDEPENDENT_AMBULATORY_CARE_PROVIDER_SITE_OTHER): Admitting: Family Medicine

## 2023-08-25 VITALS — BP 136/79 | HR 59 | Temp 97.6°F | Ht 69.0 in | Wt 235.0 lb

## 2023-08-25 DIAGNOSIS — E669 Obesity, unspecified: Secondary | ICD-10-CM

## 2023-08-25 DIAGNOSIS — I10 Essential (primary) hypertension: Secondary | ICD-10-CM | POA: Diagnosis not present

## 2023-08-25 DIAGNOSIS — F5089 Other specified eating disorder: Secondary | ICD-10-CM

## 2023-08-25 DIAGNOSIS — R7303 Prediabetes: Secondary | ICD-10-CM | POA: Diagnosis not present

## 2023-08-25 DIAGNOSIS — Z6834 Body mass index (BMI) 34.0-34.9, adult: Secondary | ICD-10-CM

## 2023-08-25 NOTE — Progress Notes (Signed)
 Office: [phone]  /  Fax: [phone]  WEIGHT SUMMARY AND BIOMETRICS  Anthropometric Measurements Height: 5' 9 (1.753 m) Weight: 235 lb (106.6 kg) BMI (Calculated): 34.69 Weight at Last Visit: 233 lb Weight Lost Since Last Visit: 0 Weight Gained Since Last Visit: 2 lb Starting Weight: 248 lb Total Weight Loss (lbs): 13 lb (5.897 kg) Peak Weight: 258 lb   Body Composition  Body Fat %: 33.2 % Fat Mass (lbs): 78.2 lbs Muscle Mass (lbs): 149.6 lbs Total Body Water (lbs): 111.2 lbs Visceral Fat Rating : 22   Other Clinical Data Fasting: yes Labs: no Today's Visit #: 56 Starting Date: 12/20/19    Chief Complaint: OBESITY   History of Present Illness Stephen Oconnell is a 78 year old male with obesity and hypertension who presents for obesity treatment and progress assessment.  He is following a category three eating plan approximately 60% of the time and has gained two pounds in the last month. He engages in physical activity by walking for 45 minutes three times a week. He has a history of emotional eating behaviors.  He has hypertension and is currently taking losartan, triamterene, and hydrochlorothiazide. His blood pressure was initially elevated at 144/72 but improved to 136/79 upon recheck.  He has been treated with Lexapro and Wellbutrin  for emotional eating behaviors but has discontinued Wellbutrin  due to perceived lack of efficacy. He continues to take Lexapro.  He has a history of prediabetes with his last A1c in January showing improvement to 5.6. He is currently on metformin .      PHYSICAL EXAM:  Blood pressure 136/79, pulse (!) 59, temperature 97.6 F (36.4 C), height 5' 9 (1.753 m), weight 235 lb (106.6 kg), SpO2 96%. Body mass index is 34.7 kg/m.  DIAGNOSTIC DATA REVIEWED:  BMET    Component Value Date/Time   NA 142 03/23/2023 0843   K 4.2 03/23/2023 0843   CL 98 03/23/2023 0843   CO2 28 03/23/2023 0843   GLUCOSE 122 (H) 03/23/2023 0843    BUN 21 03/23/2023 0843   CREATININE 1.13 03/23/2023 0843   CALCIUM  9.5 03/23/2023 0843   GFRNONAA 57 (L) 04/08/2020 0801   GFRAA 65 04/08/2020 0801   Lab Results  Component Value Date   HGBA1C 5.6 03/23/2023   HGBA1C 6.0 (H) 12/20/2019   Lab Results  Component Value Date   INSULIN  23.5 03/23/2023   INSULIN  29.5 (H) 12/20/2019   Lab Results  Component Value Date   TSH 2.500 03/23/2023   CBC    Component Value Date/Time   WBC 5.7 03/23/2023 0843   RBC 4.91 03/23/2023 0843   HGB 16.1 03/23/2023 0843   HCT 47.1 03/23/2023 0843   PLT 216 03/23/2023 0843   MCV 96 03/23/2023 0843   MCH 32.8 03/23/2023 0843   MCHC 34.2 03/23/2023 0843   RDW 13.0 03/23/2023 0843   Iron Studies    Component Value Date/Time   IRON 122 03/23/2023 0843   TIBC 302 03/23/2023 0843   FERRITIN 196 03/23/2023 0843   IRONPCTSAT 40 03/23/2023 0843   Lipid Panel     Component Value Date/Time   CHOL 114 03/23/2023 0843   TRIG 72 03/23/2023 0843   HDL 52 03/23/2023 0843   LDLCALC 47 03/23/2023 0843   Hepatic Function Panel     Component Value Date/Time   PROT 7.4 03/23/2023 0843   ALBUMIN 4.5 03/23/2023 0843   AST 20 03/23/2023 0843   ALT 20 03/23/2023 0843   ALKPHOS 58 03/23/2023  9811   BILITOT 0.5 03/23/2023 0843      Component Value Date/Time   TSH 2.500 03/23/2023 0843   Nutritional Lab Results  Component Value Date   VD25OH 45.2 03/23/2023   VD25OH 45.9 10/21/2022   VD25OH 47.0 04/20/2022     Assessment and Plan Assessment & Plan Obesity He adheres to the prescribed category three eating plan 60% of the time and has gained two pounds in the last month. He engages in physical activity by walking for 45 minutes three times a week. Emotional eating behaviors may contribute to difficulty in weight management. A low carbohydrate plan was discussed for short-term weight management, emphasizing strict adherence for at least two weeks to see benefits. Guidance was provided on  maintaining a dinner calorie range of 450-600 calories and at least 40 grams of protein to aid in weight loss, which would naturally balance other nutritional aspects. - Continue current exercise regimen of walking 45 minutes three times a week - Consider a low carbohydrate plan for short-term weight management - Adhere to a dinner calorie range of 450-600 calories with at least 40 grams of protein along with his Cat 3 eating plan  Hypertension Blood pressure was initially elevated at 144/72 but improved to 136/79 upon recheck. He is on losartan, triamterene, and hydrochlorothiazide for blood pressure management. Emphasis was placed on continuing diet, exercise, and weight loss to aid in controlling hypertension. - Continue losartan, triamterene, and hydrochlorothiazide - Continue working on diet, exercise, and weight loss  Prediabetes Last A1c was 5.6, showing improvement. A recheck of the A1c is planned for July or August, approximately six months after the last test. He is on metformin  and is advised to continue working on diet and exercise to manage prediabetes. - Continue metformin  - Recheck A1c in July or August - Continue working on diet and exercise  Emotional Eating Behaviors He has been on Lexapro and Wellbutrin  for Emotional Eating. He stopped Wellbutrin  as he felt it was not helpful and did not notice any difference after discontinuation. It was agreed to remove Wellbutrin  from the medication list and continue with Lexapro. - Remove Wellbutrin  from medication list - Continue Lexapro  Follow-up Scheduled for a follow-up appointment on July 28th at 8 AM. Another appointment is suggested four to six weeks after that to continue monitoring progress. - Follow up on July 28th at 8 AM - Schedule another appointment four to six weeks after July 28th     I have personally spent 30 minutes total time today in preparation, patient care, and documentation for this visit, including the  following: review of clinical lab tests; review of medical history, review of nutritional goals and counseling   He was informed of the importance of frequent follow up visits to maximize his success with intensive lifestyle modifications for his multiple health conditions.    Jasmine Mesi, MD

## 2023-08-30 ENCOUNTER — Encounter: Payer: Self-pay | Admitting: Physical Therapy

## 2023-08-30 ENCOUNTER — Ambulatory Visit: Admitting: Physical Therapy

## 2023-08-30 DIAGNOSIS — M6281 Muscle weakness (generalized): Secondary | ICD-10-CM | POA: Diagnosis not present

## 2023-08-30 DIAGNOSIS — M5459 Other low back pain: Secondary | ICD-10-CM

## 2023-08-30 NOTE — Therapy (Signed)
 OUTPATIENT PHYSICAL THERAPY THORACOLUMBAR TREATMENT  PHYSICAL THERAPY DISCHARGE SUMMARY  Visits from Start of Care: 7  Current functional level related to goals / functional outcomes: All goals met   Remaining deficits: See below    Education / Equipment: See below    Patient agrees to discharge. Patient goals were met. Patient is being discharged due to meeting the stated rehab goals.  Patient Name: Stephen Oconnell MRN: [id number] DOB:[birth year], 78 y.o., male Today's Date: 08/30/2023  END OF SESSION:  PT End of Session - 08/30/23 1517     Visit Number 7    Number of Visits 16    Date for PT Re-Evaluation 09/21/23    Authorization Type [employer] - shara 06/29/2023 - 09/21/2023  Number of visits: 16 Visit(s)    Authorization - Visit Number 7    Authorization - Number of Visits 16    Progress Note Due on Visit 10    PT Start Time 1517    PT Stop Time 1545    PT Time Calculation (min) 28 min    Activity Tolerance Patient tolerated treatment well    Behavior During Therapy WFL for tasks assessed/performed          Past Medical History:  Diagnosis Date   Allergies    Asthma    on inhaler   Constipation    COPD (chronic obstructive pulmonary disease) (HCC)    Emphysema lung (HCC)    GERD (gastroesophageal reflux disease)    Glaucoma    right eye   Hearing loss    Bil/has hearing aids   HTN (hypertension)    Leg swelling    Migraines    Obesity    OSA on CPAP    Skin cancer of forehead    Past Surgical History:  Procedure Laterality Date   CATARACT EXTRACTION     MOHS SURGERY     WISDOM TOOTH EXTRACTION     Patient Active Problem List   Diagnosis Date Noted   Daytime somnolence 03/23/2023   Other hyperlipidemia 08/10/2022   Vitamin D  deficiency 04/20/2022   BMI 34.0-34.9,adult 04/20/2022   Obesity, Beginning BMI 36.62 04/20/2022   Eating disorder 02/09/2022   SOB (shortness of breath) on exertion 10/13/2021   Insulin  resistance 10/13/2021    Essential hypertension 09/14/2021   Mixed hyperlipidemia 10/21/2020   Pre-diabetes 05/21/2020   Class 2 severe obesity with serious comorbidity and body mass index (BMI) of 36.0 to 36.9 in adult (HCC) 05/21/2020   Allergic rhinitis 04/18/2020   Allergic rhinitis due to animal (cat) (dog) hair and dander 04/18/2020   Allergic rhinitis due to pollen 04/18/2020   Chronic allergic conjunctivitis 04/18/2020   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   Localized, primary osteoarthritis 04/18/2020   Low back pain 04/18/2020   Mild intermittent asthma 04/18/2020   Osteoarthritis of knee 04/18/2020   Excessive cerumen in ear canal, bilateral 01/10/2018   Sensorineural hearing loss (SNHL), 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    EVAL: States he has had back pain that has increased over the last year. States that he is walking more forward and whenever he goes for  a walk after about a block he starts having pain. Pain is just in the back. No injections or HEP yet.   Reports he also notices that he has bilateral hip pain on the sides of his hip but does not think that is related  PERTINENT HISTORY:  COPD, vagal nerve reaction history, body migraines, bilateral hip pain PAIN:  Are you having pain? Yes: NPRS scale: 0/10 Pain location:  center of low back  Pain description: discomfort Aggravating factors: walking Relieving factors: sitting  PRECAUTIONS: None  RED FLAGS: None   WEIGHT BEARING RESTRICTIONS: No  FALLS:  Has patient fallen in last 6 months? No    OCCUPATION: Likes to [employer]  PLOF: Independent  PATIENT GOALS: Less pain  OBJECTIVE:  Note: Objective measures were completed at Evaluation unless otherwise noted.  DIAGNOSTIC FINDINGS:  06/09/23 lumbar xray   FINDINGS: There is no evidence of lumbar spine fracture. Alignment is normal. Moderate narrow intervertebral space with grade 1 anterolisthesis of L5 on S1. Severe facet joint degenerative changes at L5-S1. Anterior spurring is identified throughout the thoracic and lumbar spine with relative sparing of L5.   IMPRESSION: Degenerative joint changes of lumbar spine.  PATIENT SURVEYS:  Patient-specific activity functional scoring scheme (Point to one number):  0 represents "unable to perform." 10 represents "able to perform at prior level. 0 1 2 3 4 5 6 7 8 9  10 (Date and Score) Activity Initial  Activity Eval   6/3  Walking a block  5  10   Mowing the lawn  2  6  Lifting items 5 6   Additional Additional Total score = sum of the activity scores/number of activities Minimum detectable change (90%CI) for average score = 2 points Minimum detectable change (90%CI) for single activity score = 3 points PSFS developed by: Rosalee MYRTIS Marvis KYM Charlet CHRISTELLA., & Binkley, J. (1995). Assessing disability and change on individual  patients: a report of a patient specific measure. Physiotherapy [place], 47, [phone]. Reproduced with the permission of the authors  Score: EVAL: 12/3=4; 6/3: 22/3=7.33   COGNITION: Overall cognitive status: Within functional limits for tasks assessed     SENSATION: Not tested    POSTURE: rounded shoulders, forward head, and flexed trunk   PALPATION: No tenderness to palpation but increased  resting tone noted in bilateral lumbar paraspinals and hypomobility noted in lumbar spine  LUMBAR ROM:   AROM 6/3  Flexion 25% limited  Extension 90% limited  Right lateral flexion 50% limited*  Left lateral flexion 50% limited  Right rotation   Left rotation    (Blank rows = not tested) *pain    LE Measurements Lower Extremity Right 6/3 Left 6/3   A/PROM MMT A/PROM MMT  Hip Flexion      Hip Extension  4  4  Hip Abduction      Hip Adduction      Hip Internal rotation 45  40   Hip External rotation 45*  45   Knee Flexion      Knee Extension      Ankle Dorsiflexion      Ankle Plantarflexion      Ankle Inversion      Ankle Eversion       (  Blank rows = not tested) * pain   LUMBAR SPECIAL TESTS:  Slump negative Ely's +  B   TREATMENT DATE:                                                                                                                               08/30/2023  Therapeutic Exercise:  reviewed HEP  Supine:  LTR 2 MINUTES, DKC ball 2 minutes, SAQs 2 minutes B,   Prone: lying with hamstring curls 2 minutes, plank max effort 32 seconds  Seated: lumbar ROT B 2 minutes,   Standing:    Neuromuscular Re-education: pallof press 2x10 B 2 blue bands, balance SLS  with over head press and one hand support x2 20 holds B Manual Therapy:  Therapeutic Activity: Self Care:   Trigger Point Dry Needling:  Modalities:     PATIENT EDUCATION:  Education details: on  HEP, on anatomy and rationale behind interventions, on plan moving forward Person educated: Patient Education method: Explanation, Demonstration, and Handouts Education comprehension: verbalized understanding   HOME EXERCISE PROGRAM: [id number] Long exhale breathing with zippering focus ASSESSMENT:  CLINICAL IMPRESSION: 08/30/2023 Reviewed entire HEP and answered all questions. Slight stiffness in back at start of session but this resolved with exercises  during session. Overall patient doing well  and is to DC from PT to HEP secondary to progress made and independence in HEP.    EVAL: Patient presents to physical therapy with complaints of chronic low back pain and diagnosis of stenosis.  Patient presents with limited range of motion, strength and overall posture that is contributing to current presentation.  Session focused on education as well as plan moving forward.  Patient would greatly benefit from skilled PT to address physical impairments and improve overall function and quality of life.  OBJECTIVE IMPAIRMENTS: decreased ROM, decreased strength, postural dysfunction, and pain.   ACTIVITY LIMITATIONS: carrying, lifting, standing, transfers, and locomotion level  PARTICIPATION LIMITATIONS: meal prep, cleaning, and woodworking  PERSONAL FACTORS: Fitness and Time since onset of injury/illness/exacerbation are also affecting patient's functional outcome.   REHAB POTENTIAL: Good  CLINICAL DECISION MAKING: Stable/uncomplicated  EVALUATION COMPLEXITY: Low   GOALS: Goals reviewed with patient? yes  SHORT TERM GOALS: Target date: 08/10/2023 Patient will be independent in self management strategies to improve quality of life and functional outcomes. Baseline: New Program Goal status: MET   2.  Patient will report at least 50% improvement in overall symptoms and/or function to demonstrate improved functional mobility Baseline: 0% better Goal status: MET  3.  Patient will be able to walk 1 block without severe pain and back to improve functional mobility in the community Baseline: MET     LONG TERM GOALS: Target date: 09/21/2023   Patient will report at least 75% improvement in overall symptoms and/or function to demonstrate improved functional mobility Baseline: 0% better Goal status: MET  2.  Patient will score at least 2 points higher on PSFS average to  demonstrate change in overall function. Baseline: see above Goal status: MET  3.  Patient will be able to walk 3  blocks without severe pain to improve functional mobility in the community Baseline: Unable Goal status: MET    PLAN:  PT FREQUENCY: 1-2x/week for total of 16 visits over 12 weeks certification.  PT DURATION: 12 weeks  PLANNED INTERVENTIONS: 97110-Therapeutic exercises, 97530- Therapeutic activity, [id number]- Neuromuscular re-education, [phone]- Self Care, 02859- Manual therapy, [phone]- Gait training, [phone]- Orthotic Fit/training, [phone]- Canalith repositioning, [id number]- Aquatic Therapy, [id number]- Electrical stimulation (unattended), [phone]- Ionotophoresis 4mg /ml Dexamethasone, Patient/Family education, Balance training, Stair training, Taping, Dry Needling, Joint mobilization, Joint manipulation, Spinal manipulation, Spinal mobilization, Cryotherapy, and Moist heat   PLAN FOR NEXT SESSION: DC to HEP   3:52 PM, 08/30/23 Olivia Church, DPT Physical Therapy with [facility]

## 2023-10-01 ENCOUNTER — Emergency Department (HOSPITAL_BASED_OUTPATIENT_CLINIC_OR_DEPARTMENT_OTHER)
Admission: EM | Admit: 2023-10-01 | Discharge: 2023-10-01 | Disposition: A | Discharge status: Home / Self Care | Attending: Emergency Medicine | Admitting: Emergency Medicine

## 2023-10-01 ENCOUNTER — Other Ambulatory Visit: Payer: Self-pay

## 2023-10-01 ENCOUNTER — Encounter (HOSPITAL_BASED_OUTPATIENT_CLINIC_OR_DEPARTMENT_OTHER): Payer: Self-pay | Admitting: Emergency Medicine

## 2023-10-01 DIAGNOSIS — Z7982 Long term (current) use of aspirin: Secondary | ICD-10-CM | POA: Insufficient documentation

## 2023-10-01 DIAGNOSIS — Z23 Encounter for immunization: Secondary | ICD-10-CM | POA: Insufficient documentation

## 2023-10-01 DIAGNOSIS — W312XXA Contact with powered woodworking and forming machines, initial encounter: Secondary | ICD-10-CM | POA: Insufficient documentation

## 2023-10-01 DIAGNOSIS — Z79899 Other long term (current) drug therapy: Secondary | ICD-10-CM | POA: Insufficient documentation

## 2023-10-01 DIAGNOSIS — J45909 Unspecified asthma, uncomplicated: Secondary | ICD-10-CM | POA: Insufficient documentation

## 2023-10-01 DIAGNOSIS — S61211A Laceration without foreign body of left index finger without damage to nail, initial encounter: Secondary | ICD-10-CM | POA: Insufficient documentation

## 2023-10-01 DIAGNOSIS — I10 Essential (primary) hypertension: Secondary | ICD-10-CM | POA: Insufficient documentation

## 2023-10-01 DIAGNOSIS — S6992XA Unspecified injury of left wrist, hand and finger(s), initial encounter: Secondary | ICD-10-CM | POA: Diagnosis present

## 2023-10-01 MED ORDER — TETANUS-DIPHTH-ACELL PERTUSSIS 5-2.5-18.5 LF-MCG/0.5 IM SUSY
0.5000 mL | PREFILLED_SYRINGE | Freq: Once | INTRAMUSCULAR | Status: AC
  Administered 2023-10-01: 0.5 mL via INTRAMUSCULAR
  Filled 2023-10-01: qty 0.5

## 2023-10-01 MED ORDER — LIDOCAINE-EPINEPHRINE (PF) 2 %-1:200000 IJ SOLN
10.0000 mL | Freq: Once | INTRAMUSCULAR | Status: AC
  Administered 2023-10-01: 10 mL
  Filled 2023-10-01: qty 20

## 2023-10-01 MED ORDER — BACITRACIN ZINC 500 UNIT/GM EX OINT
TOPICAL_OINTMENT | Freq: Once | CUTANEOUS | Status: DC
Start: 2023-10-01 — End: 2023-10-01
  Filled 2023-10-01: qty 28.35

## 2023-10-01 NOTE — Discharge Instructions (Addendum)
 Thank you relates evaluate you today.  We have repaired your finger laceration with Dermabond/superglue.  This should follow-up in 3-5 days.  We have updated your tetanus shot here today  Return to Emergency Department if you experience red hot swollen joint especially with fever, cloudy foul-smelling drainage from wound, inability to move finger normally, significant worsening symptoms

## 2023-10-01 NOTE — ED Provider Notes (Signed)
 [place] [facility] AT [facility] Provider Note   CSN: [id number] Arrival date & time: 10/01/23  1418     Patient presents with: Laceration   Stephen Oconnell is a 78 y.o. male with a past medical history of asthma, HLD, HTN presents to the emergency department for evaluation of left index finger laceration.  He reports that he was cutting with a new table saw when he accidentally cut the tip of his left index finger.  Unknown last tetanus.  Denies any other injuries, head injury, LOC.  No complaints prior to laceration    Laceration      Prior to Admission medications   Medication Sig Start Date End Date Taking? Authorizing Provider  Albuterol Sulfate (PROAIR HFA IN) Inhale into the lungs as needed.    [provider]  aspirin EC 81 MG tablet Take 1 tablet (81 mg total) by mouth daily. Swallow whole. 01/04/20   Barbaraann Darryle Ned, MD  escitalopram (LEXAPRO) 10 MG tablet Take 10 mg by mouth daily.    [provider]  losartan  (COZAAR ) 50 MG tablet Take 50 mg by mouth daily.    [provider]  metFORMIN  (GLUCOPHAGE ) 500 MG tablet Take 1 tablet (500 mg total) by mouth 2 (two) times daily with a meal. 07/25/23   Beasley, Caren D, MD  polyethylene glycol (MIRALAX / GLYCOLAX) 17 g packet Take 17 g by mouth daily as needed.    [provider]  rizatriptan (MAXALT) 10 MG tablet Take 10 mg by mouth as needed. 08/24/22   [provider]  rosuvastatin  (CRESTOR ) 5 MG tablet Take 1 tablet (5 mg total) by mouth daily. 03/16/23   Barbaraann Darryle Ned, MD  triamterene-hydrochlorothiazide (MAXZIDE-25) 37.5-25 MG tablet Take 1 tablet by mouth daily. 07/06/21   [provider]    Allergies: Penicillins    Review of Systems  Skin:  Positive for wound.    Updated Vital Signs BP (!) 151/70   Pulse 84   Temp 97.8 F (36.6 C)   Resp 16   Ht 5' 9 (1.753 m)   Wt 106.6 kg   SpO2 96%   BMI 34.70 kg/m   Physical  Exam Vitals and nursing note reviewed.  Constitutional:      General: He is not in acute distress.    Appearance: Normal appearance.  HENT:     Head: Normocephalic and atraumatic.  Eyes:     Conjunctiva/sclera: Conjunctivae normal.  Cardiovascular:     Rate and Rhythm: Normal rate.  Pulmonary:     Effort: Pulmonary effort is normal. No respiratory distress.     Breath sounds: Normal breath sounds.  Musculoskeletal:     Comments: No tenderness, swelling, warmth, erythema to DIP, PIP, MCP of all joints of left hand.  FDP of left index finger intact.  Motor 5/5 and sensation 2/2 of digits 1-5 of left hand.   Skin:    Capillary Refill: Capillary refill takes less than 2 seconds.     Coloration: Skin is not jaundiced or pale.     Comments: 1.5cm linear laceration with well approximated edges. No active hemorrhage nor drainage  Neurological:     Mental Status: He is alert and oriented to person, place, and time. Mental status is at baseline.     (all labs ordered are listed, but only abnormal results are displayed) Labs Reviewed - No data to display  EKG: None  Radiology: No results found.   .Laceration Repair  Date/Time: 10/01/2023  4:12 PM  Performed by: Minnie Tinnie BRAVO, PA Authorized by: Minnie Tinnie BRAVO, PA   Consent:    Consent obtained:  Verbal   Consent given by:  Patient   Risks, benefits, and alternatives were discussed: yes     Risks discussed:  Infection, pain, poor cosmetic result and poor wound healing   Alternatives discussed: Dermabond versus Steri-Strips versus sutures versus no treatment. Universal protocol:    Procedure explained and questions answered to patient or proxy's satisfaction: yes     Patient identity confirmed:  Verbally with patient and arm band Anesthesia:    Anesthesia method:  None Laceration details:    Location:  Finger   Finger location:  L index finger   Length (cm):  1.5 Treatment:    Area cleansed with:  Saline and Shur-Clens    Amount of cleaning:  Standard   Irrigation volume:  100 Skin repair:    Repair method:  Tissue adhesive Approximation:    Approximation:  Close Repair type:    Repair type:  Simple Post-procedure details:    Dressing:  Open (no dressing)   Procedure completion:  Tolerated well, no immediate complications    Medications Ordered in the ED  bacitracin  ointment (has no administration in time range)  Tdap (BOOSTRIX ) injection 0.5 mL (has no administration in time range)  lidocaine -EPINEPHrine  (XYLOCAINE  W/EPI) 2 %-1:200000 (PF) injection 10 mL (10 mLs Infiltration Given 10/01/23 1542)                                    Medical Decision Making Risk OTC drugs. Prescription drug management.   Patient presents to the ED for concern of finger laceration, this involves an extensive number of treatment options, and is a complaint that carries with it a high risk of complications and morbidity.  The differential diagnosis includes infection, tendon injury, foreign body, open fracture   Co morbidities that complicate the patient evaluation  See HPI   Additional history obtained:  Additional history obtained from Nursing   External records from outside source obtained and reviewed including triage RN note     Medicines ordered and prescription drug management:  I ordered medication including tdap  for tetanus prophylaxis  Reevaluation of the patient after these medicines showed that the patient stayed the same I have reviewed the patients home medicines and have made adjustments as needed    Problem List / ED Course:  Finger laceration  Laceration from clean new tablesaw.  Wound does not appear infectious with no drainage, erythema, warmth, nor swelling.  Do not feel that prophylactic antibiotics are required.  However, will update Tdap as patient is unsure when his last Tdap was Do not feel that imaging is required as wound is superficial and he does not have any bony  tenderness. Low suspicion for open fracture FDP is intact.  Low suspicion for tendon injury/laceration No nailbed extension or damage Shared decision-making is had with patient regarding suture laceration repair versus Dermabond.  Patient wishes to proceed with Dermabond.  As it is well-approximated and linear, I think that Dermabond would be sufficient for laceration repair Provided symptomatic care at home, nonsutured laceration repair instructions/recommendations on DC paperwork, and return precautions with patient   Reevaluation:  After the interventions noted above, I reevaluated the patient and found that they have :improved   Social Determinants of Health:  Has PCP   Dispostion:  After consideration  of the diagnostic results and the patients response to treatment, I feel that the patent would benefit from outpatient management symptomatic care.   Discussed ED workup, disposition, return to ED precautions with patient who expresses understanding agrees with plan.  All questions answered to their satisfaction.  They are agreeable to plan.  Discharge instructions provided on paperwork  Final diagnoses:  Laceration of left index finger without foreign body without damage to nail, initial encounter    ED Discharge Orders     None        Minnie Tinnie BRAVO, PA 10/01/23 1621    Lenor Hollering, MD 10/01/23 1745

## 2023-10-01 NOTE — ED Notes (Signed)
 Cleaned laceration on left index finger with normal saline and redressed with gauze. Pt tolerated well.

## 2023-10-01 NOTE — ED Triage Notes (Signed)
 Pt arrived from home with c/o laceration to L index finger from a saw. Bleeding subsided at this time.

## 2023-10-03 ENCOUNTER — Encounter (INDEPENDENT_AMBULATORY_CARE_PROVIDER_SITE_OTHER): Payer: Self-pay | Admitting: Family Medicine

## 2023-10-03 ENCOUNTER — Ambulatory Visit (INDEPENDENT_AMBULATORY_CARE_PROVIDER_SITE_OTHER): Admitting: Family Medicine

## 2023-10-03 VITALS — BP 150/79 | HR 65 | Temp 97.5°F | Ht 69.0 in | Wt 237.0 lb

## 2023-10-03 DIAGNOSIS — E669 Obesity, unspecified: Secondary | ICD-10-CM

## 2023-10-03 DIAGNOSIS — Z6835 Body mass index (BMI) 35.0-35.9, adult: Secondary | ICD-10-CM

## 2023-10-03 DIAGNOSIS — I10 Essential (primary) hypertension: Secondary | ICD-10-CM

## 2023-10-03 DIAGNOSIS — R7303 Prediabetes: Secondary | ICD-10-CM | POA: Diagnosis not present

## 2023-10-03 MED ORDER — LOSARTAN POTASSIUM 50 MG PO TABS: 50.0000 mg | ORAL_TABLET | Freq: Every day | ORAL | 0 refills | Status: DC

## 2023-10-03 MED ORDER — METFORMIN HCL 500 MG PO TABS: 500.0000 mg | ORAL_TABLET | Freq: Two times a day (BID) | ORAL | 0 refills | Status: DC

## 2023-10-03 NOTE — Progress Notes (Signed)
 Office: [phone]  /  Fax: [phone]  WEIGHT SUMMARY AND BIOMETRICS  Anthropometric Measurements Height: 5' 9 (1.753 m) Weight: 237 lb (107.5 kg) BMI (Calculated): 34.98 Weight at Last Visit: 235 lb Weight Lost Since Last Visit: 0 Weight Gained Since Last Visit: 2 lb Starting Weight: 248 lb Total Weight Loss (lbs): 11 lb (4.99 kg) Peak Weight: 258 lb   Body Composition  Body Fat %: 33.5 % Fat Mass (lbs): 79.6 lbs Muscle Mass (lbs): 150 lbs Total Body Water (lbs): 111 lbs Visceral Fat Rating : 22   Other Clinical Data Fasting: yes Labs: no Today's Visit #: 96 Starting Date: 12/20/19    Chief Complaint: OBESITY    History of Present Illness Stephen Oconnell is a 78 year old male who presents for obesity treatment and progress assessment.  He is following a category three eating plan approximately 60% of the time and engages in walking for exercise 50 minutes, three to four times a week. Despite these efforts, he has gained a couple of pounds, attributing one pound to muscle gain.  His blood pressure readings are elevated, and he has been out of his losartan  for about two months due to a prescription refill issue. He is unsure which doctor prescribed it. He does not regularly check his blood pressure at home, doing so only once every three or four years. He is also on triamterene hydrochlorothiazide, which he has at home.  He has a family history of hypothyroidism, with both his mother and sister affected. He is concerned about weight gain and memory issues, particularly with recalling names, although he does not believe he has dementia. His TSH levels were checked in January and were within the normal range at 2.5.  He uses a CPAP machine for sleep apnea and reports getting about seven to seven and a half hours of sleep per night, though he does not always wake up feeling refreshed. He is compliant with CPAP use according to his annual check-ups. No gastrointestinal  issues with metformin  use.      PHYSICAL EXAM:  Blood pressure (!) 150/79, pulse 65, temperature (!) 97.5 F (36.4 C), height 5' 9 (1.753 m), weight 237 lb (107.5 kg), SpO2 95%. Body mass index is 35 kg/m.  DIAGNOSTIC DATA REVIEWED:  BMET    Component Value Date/Time   NA 142 03/23/2023 0843   K 4.2 03/23/2023 0843   CL 98 03/23/2023 0843   CO2 28 03/23/2023 0843   GLUCOSE 122 (H) 03/23/2023 0843   BUN 21 03/23/2023 0843   CREATININE 1.13 03/23/2023 0843   CALCIUM  9.5 03/23/2023 0843   GFRNONAA 57 (L) 04/08/2020 0801   GFRAA 65 04/08/2020 0801   Lab Results  Component Value Date   HGBA1C 5.6 03/23/2023   HGBA1C 6.0 (H) 12/20/2019   Lab Results  Component Value Date   INSULIN  23.5 03/23/2023   INSULIN  29.5 (H) 12/20/2019   Lab Results  Component Value Date   TSH 2.500 03/23/2023   CBC    Component Value Date/Time   WBC 5.7 03/23/2023 0843   RBC 4.91 03/23/2023 0843   HGB 16.1 03/23/2023 0843   HCT 47.1 03/23/2023 0843   PLT 216 03/23/2023 0843   MCV 96 03/23/2023 0843   MCH 32.8 03/23/2023 0843   MCHC 34.2 03/23/2023 0843   RDW 13.0 03/23/2023 0843   Iron Studies    Component Value Date/Time   IRON 122 03/23/2023 0843   TIBC 302 03/23/2023 0843   FERRITIN 196  03/23/2023 0843   IRONPCTSAT 40 03/23/2023 0843   Lipid Panel     Component Value Date/Time   CHOL 114 03/23/2023 0843   TRIG 72 03/23/2023 0843   HDL 52 03/23/2023 0843   LDLCALC 47 03/23/2023 0843   Hepatic Function Panel     Component Value Date/Time   PROT 7.4 03/23/2023 0843   ALBUMIN 4.5 03/23/2023 0843   AST 20 03/23/2023 0843   ALT 20 03/23/2023 0843   ALKPHOS 58 03/23/2023 0843   BILITOT 0.5 03/23/2023 0843      Component Value Date/Time   TSH 2.500 03/23/2023 0843   Nutritional Lab Results  Component Value Date   VD25OH 45.2 03/23/2023   VD25OH 45.9 10/21/2022   VD25OH 47.0 04/20/2022     Assessment and Plan Assessment & Plan Hypertension Blood pressure is  elevated at 150/70 mmHg due to a two-month lapse in losartan . Previously, blood pressure was well-controlled and sometimes low. He does not regularly monitor blood pressure at home. - Refill losartan  50 mg - Instruct to check blood pressure at home twice a week and report readings - Ensure blood pressure remains below 140/90 mmHg  Obesity He adheres to the category three eating plan approximately 60% of the time and exercises by walking 50 minutes three to four times a week. Slight weight gain is attributed to increased muscle mass. Plans to switch to a low-carb eating plan for a couple of weeks to jumpstart weight loss. - Switch to low-carb eating plan for 2-4 weeks - Continue working on exercise and weight loss.  Pre-diabetes He is on metformin  without gastrointestinal issues. He is working on lifestyle modifications - Continue metformin  - Continue diet, exercise and weight loss      He was informed of the importance of frequent follow up visits to maximize his success with intensive lifestyle modifications for his multiple health conditions.    Louann Penton, MD

## 2023-11-14 ENCOUNTER — Ambulatory Visit (INDEPENDENT_AMBULATORY_CARE_PROVIDER_SITE_OTHER): Admitting: Family Medicine

## 2023-11-14 ENCOUNTER — Encounter (INDEPENDENT_AMBULATORY_CARE_PROVIDER_SITE_OTHER): Payer: Self-pay | Admitting: Family Medicine

## 2023-11-14 VITALS — BP 162/80 | HR 63 | Temp 97.7°F | Ht 69.0 in | Wt 240.0 lb

## 2023-11-14 DIAGNOSIS — R7303 Prediabetes: Secondary | ICD-10-CM | POA: Diagnosis not present

## 2023-11-14 DIAGNOSIS — I10 Essential (primary) hypertension: Secondary | ICD-10-CM | POA: Diagnosis not present

## 2023-11-14 DIAGNOSIS — E785 Hyperlipidemia, unspecified: Secondary | ICD-10-CM

## 2023-11-14 DIAGNOSIS — Z6835 Body mass index (BMI) 35.0-35.9, adult: Secondary | ICD-10-CM

## 2023-11-14 DIAGNOSIS — F5089 Other specified eating disorder: Secondary | ICD-10-CM

## 2023-11-14 DIAGNOSIS — E66812 Obesity, class 2: Secondary | ICD-10-CM

## 2023-11-14 DIAGNOSIS — E538 Deficiency of other specified B group vitamins: Secondary | ICD-10-CM

## 2023-11-14 MED ORDER — BUPROPION HCL ER (SR) 150 MG PO TB12: 150.0000 mg | ORAL_TABLET | Freq: Every day | ORAL | 0 refills | Status: DC

## 2023-11-14 MED ORDER — METFORMIN HCL 500 MG PO TABS: 500.0000 mg | ORAL_TABLET | Freq: Two times a day (BID) | ORAL | 0 refills | Status: DC

## 2023-11-14 MED ORDER — LOSARTAN POTASSIUM 50 MG PO TABS: 50.0000 mg | ORAL_TABLET | Freq: Every day | ORAL | 0 refills | Status: DC

## 2023-11-14 NOTE — Progress Notes (Signed)
 Office: [phone]  /  Fax: [phone]  WEIGHT SUMMARY AND BIOMETRICS  Anthropometric Measurements Height: 5' 9 (1.753 m) Weight: 240 lb (108.9 kg) BMI (Calculated): 35.43 Weight at Last Visit: 237 lb Weight Lost Since Last Visit: 0 Weight Gained Since Last Visit: 3 lb Starting Weight: 248 lb Total Weight Loss (lbs): 8 lb (3.629 kg) Peak Weight: 258 lb   Body Composition  Body Fat %: 33.4 % Fat Mass (lbs): 80.4 lbs Muscle Mass (lbs): 152.4 lbs Total Body Water (lbs): 113.4 lbs Visceral Fat Rating : 22   Other Clinical Data Fasting: yes Labs: no Today's Visit #: 16 Starting Date: 12/20/19    Chief Complaint: OBESITY   History of Present Illness Stephen Oconnell is a 78 year old male with obesity, hypertension, and prediabetes who presents for obesity treatment and progress assessment.  He has been adhering to a category three eating plan about fifty percent of the time and engages in walking for exercise for forty-five to fifty minutes, three to four times a week. Despite these efforts, he has gained three pounds in the last six weeks. He wants to lose twenty pounds and maintain the weight loss. His diet includes three eggs for breakfast, a [place] or ham sandwich with cheese for lunch, and a variety of meats and vegetables for dinner. He also consumes fruit, yogurt, and occasionally snacks on chocolate bars, cookies, and popcorn. He is not tracking his protein intake.  His blood pressure was elevated today, and he is currently taking Maxzide and losartan  for blood pressure management, which has traditionally been better controlled.  He is managing prediabetes with diet, exercise, weight loss efforts, and metformin . He is currently fasting, which allows for lab work to be conducted today.  He is on Crestor  (rosuvastatin ) 5 mg most days of the week for cholesterol management, which has shown perfect levels in the past.  He mentions a past medication change from  Wellbutrin  to Lexapro, which his wife believes coincided with a twenty-pound weight gain.      PHYSICAL EXAM:  Blood pressure (!) 162/80, pulse 63, temperature 97.7 F (36.5 C), height 5' 9 (1.753 m), weight 240 lb (108.9 kg), SpO2 96%. Body mass index is 35.44 kg/m.  DIAGNOSTIC DATA REVIEWED:  BMET    Component Value Date/Time   NA 142 03/23/2023 0843   K 4.2 03/23/2023 0843   CL 98 03/23/2023 0843   CO2 28 03/23/2023 0843   GLUCOSE 122 (H) 03/23/2023 0843   BUN 21 03/23/2023 0843   CREATININE 1.13 03/23/2023 0843   CALCIUM  9.5 03/23/2023 0843   GFRNONAA 57 (L) 04/08/2020 0801   GFRAA 65 04/08/2020 0801   Lab Results  Component Value Date   HGBA1C 5.6 03/23/2023   HGBA1C 6.0 (H) 12/20/2019   Lab Results  Component Value Date   INSULIN  23.5 03/23/2023   INSULIN  29.5 (H) 12/20/2019   Lab Results  Component Value Date   TSH 2.500 03/23/2023   CBC    Component Value Date/Time   WBC 5.7 03/23/2023 0843   RBC 4.91 03/23/2023 0843   HGB 16.1 03/23/2023 0843   HCT 47.1 03/23/2023 0843   PLT 216 03/23/2023 0843   MCV 96 03/23/2023 0843   MCH 32.8 03/23/2023 0843   MCHC 34.2 03/23/2023 0843   RDW 13.0 03/23/2023 0843   Iron Studies    Component Value Date/Time   IRON 122 03/23/2023 0843   TIBC 302 03/23/2023 0843   FERRITIN 196 03/23/2023 0843  IRONPCTSAT 40 03/23/2023 0843   Lipid Panel     Component Value Date/Time   CHOL 114 03/23/2023 0843   TRIG 72 03/23/2023 0843   HDL 52 03/23/2023 0843   LDLCALC 47 03/23/2023 0843   Hepatic Function Panel     Component Value Date/Time   PROT 7.4 03/23/2023 0843   ALBUMIN 4.5 03/23/2023 0843   AST 20 03/23/2023 0843   ALT 20 03/23/2023 0843   ALKPHOS 58 03/23/2023 0843   BILITOT 0.5 03/23/2023 0843      Component Value Date/Time   TSH 2.500 03/23/2023 0843   Nutritional Lab Results  Component Value Date   VD25OH 45.2 03/23/2023   VD25OH 45.9 10/21/2022   VD25OH 47.0 04/20/2022     Assessment  and Plan Assessment & Plan 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 Current dietary adherence is approximately 50% to the prescribed eating plan. Exercise includes walking 45-50 minutes, three to four times a week. Potential factors affecting weight include metabolic rate changes, dietary intake, and medication effects. Discussion on hunger levels and dietary habits revealed no significant hunger issues but potential overconsumption of fruit and snacks. Consideration of medication changes to aid weight management, specifically transitioning from Lexapro to Wellbutrin , which may assist with weight loss. - Schedule metabolism test to assess metabolic rate - Instruct to track dietary intake including calories and protein for at least two weeks - Recommend not to consume less than 1400 calories per day, with a target of 1600 calories on active days - Advise to consume at least 100 grams of protein per day - Initiate tapering off Lexapro and start Wellbutrin  SR 150 mg in AM  - Check fasting labs including vitamin levels, kidney and liver function, cholesterol, insulin , and thyroid  function  Essential Hypertension Essential hypertension with elevated blood pressure readings of 155/67 and 162/80. Blood pressure has been traditionally better controlled with current medications, Maxzide and losartan . - Instruct to monitor blood pressure at home once or twice a week, varying times between morning and evening - Continue current medications: Maxzide in the morning and losartan  50 mg in the evening  Prediabetes Prediabetes managed with diet, exercise, weight loss efforts, and metformin . - Continue current management with diet, exercise, and metformin  - Check fasting labs including insulin  levels     Williom was informed of the importance of frequent follow up visits to maximize his success with intensive lifestyle modifications for  his obesity and obesity related health conditions as recommended by USPSTF and CMS guidelines   Louann Penton, MD

## 2023-11-15 LAB — CMP14+EGFR
ALT: 29 IU/L (ref 0–44)
AST: 25 IU/L (ref 0–40)
Albumin: 4.7 g/dL (ref 3.8–4.8)
Alkaline Phosphatase: 53 IU/L (ref 44–121)
BUN/Creatinine Ratio: 21 (ref 10–24)
BUN: 23 mg/dL (ref 8–27)
Bilirubin Total: 0.4 mg/dL (ref 0.0–1.2)
CO2: 23 mmol/L (ref 20–29)
Calcium: 9.2 mg/dL (ref 8.6–10.2)
Chloride: 100 mmol/L (ref 96–106)
Creatinine, Ser: 1.12 mg/dL (ref 0.76–1.27)
Globulin, Total: 2.4 g/dL (ref 1.5–4.5)
Glucose: 115 mg/dL — ABNORMAL HIGH (ref 70–99)
Potassium: 4.2 mmol/L (ref 3.5–5.2)
Sodium: 139 mmol/L (ref 134–144)
Total Protein: 7.1 g/dL (ref 6.0–8.5)
eGFR: 69 mL/min/1.73

## 2023-11-15 LAB — INSULIN, RANDOM: INSULIN: 26.1 u[IU]/mL — ABNORMAL HIGH (ref 2.6–24.9)

## 2023-11-15 LAB — HEMOGLOBIN A1C
Est. average glucose Bld gHb Est-mCnc: 126 mg/dL
Hgb A1c MFr Bld: 6 % — ABNORMAL HIGH (ref 4.8–5.6)

## 2023-11-15 LAB — CBC WITH DIFFERENTIAL/PLATELET
Basophils Absolute: 0 x10E3/uL (ref 0.0–0.2)
Basos: 0 %
EOS (ABSOLUTE): 0.1 x10E3/uL (ref 0.0–0.4)
Eos: 2 %
Hematocrit: 45.1 % (ref 37.5–51.0)
Hemoglobin: 15.3 g/dL (ref 13.0–17.7)
Immature Grans (Abs): 0 x10E3/uL (ref 0.0–0.1)
Immature Granulocytes: 0 %
Lymphocytes Absolute: 1.4 x10E3/uL (ref 0.7–3.1)
Lymphs: 25 %
MCH: 32.9 pg (ref 26.6–33.0)
MCHC: 33.9 g/dL (ref 31.5–35.7)
MCV: 97 fL (ref 79–97)
Monocytes Absolute: 0.9 x10E3/uL (ref 0.1–0.9)
Monocytes: 15 %
Neutrophils Absolute: 3.2 x10E3/uL (ref 1.4–7.0)
Neutrophils: 58 %
Platelets: 188 x10E3/uL (ref 150–450)
RBC: 4.65 x10E6/uL (ref 4.14–5.80)
RDW: 12.9 % (ref 11.6–15.4)
WBC: 5.6 x10E3/uL (ref 3.4–10.8)

## 2023-11-15 LAB — VITAMIN B12: Vitamin B-12: 685 pg/mL (ref 232–1245)

## 2023-11-15 LAB — VITAMIN D 25 HYDROXY (VIT D DEFICIENCY, FRACTURES): Vit D, 25-Hydroxy: 39.5 ng/mL (ref 30.0–100.0)

## 2023-11-15 LAB — LIPID PANEL WITH LDL/HDL RATIO
Cholesterol, Total: 98 mg/dL — ABNORMAL LOW (ref 100–199)
HDL: 47 mg/dL (ref >39)
LDL Chol Calc (NIH): 36 mg/dL (ref 0–99)
LDL/HDL Ratio: 0.8 ratio (ref 0.0–3.6)
Triglycerides: 69 mg/dL (ref 0–149)
VLDL Cholesterol Cal: 15 mg/dL (ref 5–40)

## 2023-12-16 ENCOUNTER — Other Ambulatory Visit (INDEPENDENT_AMBULATORY_CARE_PROVIDER_SITE_OTHER): Payer: Self-pay | Admitting: Family Medicine

## 2023-12-26 ENCOUNTER — Encounter (INDEPENDENT_AMBULATORY_CARE_PROVIDER_SITE_OTHER): Payer: Self-pay | Admitting: Family Medicine

## 2023-12-26 ENCOUNTER — Ambulatory Visit (INDEPENDENT_AMBULATORY_CARE_PROVIDER_SITE_OTHER): Admitting: Family Medicine

## 2023-12-26 VITALS — BP 121/63 | HR 67 | Temp 97.5°F | Ht 69.0 in | Wt 236.0 lb

## 2023-12-26 DIAGNOSIS — F418 Other specified anxiety disorders: Secondary | ICD-10-CM | POA: Diagnosis not present

## 2023-12-26 DIAGNOSIS — I10 Essential (primary) hypertension: Secondary | ICD-10-CM | POA: Diagnosis not present

## 2023-12-26 DIAGNOSIS — E785 Hyperlipidemia, unspecified: Secondary | ICD-10-CM | POA: Diagnosis not present

## 2023-12-26 DIAGNOSIS — R7303 Prediabetes: Secondary | ICD-10-CM | POA: Diagnosis not present

## 2023-12-26 DIAGNOSIS — R0602 Shortness of breath: Secondary | ICD-10-CM | POA: Diagnosis not present

## 2023-12-26 DIAGNOSIS — Z6834 Body mass index (BMI) 34.0-34.9, adult: Secondary | ICD-10-CM

## 2023-12-26 DIAGNOSIS — E669 Obesity, unspecified: Secondary | ICD-10-CM

## 2023-12-26 MED ORDER — BUPROPION HCL ER (SR) 200 MG PO TB12: 200.0000 mg | ORAL_TABLET | Freq: Every day | ORAL | 0 refills | Status: DC

## 2023-12-26 NOTE — Progress Notes (Signed)
 Office: [phone]  /  Fax: [phone]  WEIGHT SUMMARY AND BIOMETRICS  Anthropometric Measurements Height: 5' 9 (1.753 m) Weight: 236 lb (107 kg) BMI (Calculated): 34.84 Weight at Last Visit: 240 lb Weight Lost Since Last Visit: 4 lb Weight Gained Since Last Visit: 0 Starting Weight: 248 lb Total Weight Loss (lbs): 12 lb (5.443 kg) Peak Weight: 258 lb   Body Composition  Body Fat %: 34 % Fat Mass (lbs): 80.4 lbs Muscle Mass (lbs): 148.2 lbs Total Body Water (lbs): 112 lbs Visceral Fat Rating : 22   Other Clinical Data RMR: 1800 Fasting: yes Labs: yes Today's Visit #: 37 Starting Date: 12/20/19    Chief Complaint: OBESITY    History of Present Illness Stephen Oconnell is a 78 year old male who presents for obesity treatment management.  He is following a category three eating plan 85% of the time and engages in walking for exercise three to five days per week, approximately 60 minutes per session. Over the past month, he has lost four pounds. He is working on increasing his intake of fruits, vegetables, and protein, while ensuring adequate hydration and avoiding skipped meals. He generally sleeps seven or more hours most nights.  He experiences shortness of breath with exercise. An indirect calorie measure test was repeated, showing a drop in his resting metabolic rate by about 100 calories from three years ago, which is lower than expected based on bioimpedance scale analysis.  He is currently on metformin  twice a day for prediabetes, with the second dose taken around 1 PM. His A1c has increased to 6.0. He is mindful of his diet, avoiding foods that are not 'worth it' in terms of calorie intake.  He has a history of depression with anxiety and has recently stopped taking Lexapro. His wife observed a decrease in his appetite since stopping Lexapro, but noted increased irritability. He is currently on Wellbutrin  SR at a low dose.  He continues to take Crestor  5 mg  daily for hyperlipidemia, which is well controlled. His blood pressure is managed with hydrochlorothiazide 37.5/25 mg and losartan  50 mg. Recent lab results show good kidney and liver function, with a slight decrease in vitamin D  levels. His cholesterol levels are within desired ranges, and his blood pressure is 121/63.  IC shows REE has decreased and is below expected based on RMR      PHYSICAL EXAM:  Blood pressure 121/63, pulse 67, temperature (!) 97.5 F (36.4 C), height 5' 9 (1.753 m), weight 236 lb (107 kg), SpO2 95%. Body mass index is 34.85 kg/m.  DIAGNOSTIC DATA REVIEWED:  BMET    Component Value Date/Time   NA 139 11/14/2023 0749   K 4.2 11/14/2023 0749   CL 100 11/14/2023 0749   CO2 23 11/14/2023 0749   GLUCOSE 115 (H) 11/14/2023 0749   BUN 23 11/14/2023 0749   CREATININE 1.12 11/14/2023 0749   CALCIUM  9.2 11/14/2023 0749   GFRNONAA 57 (L) 04/08/2020 0801   GFRAA 65 04/08/2020 0801   Lab Results  Component Value Date   HGBA1C 6.0 (H) 11/14/2023   HGBA1C 6.0 (H) 12/20/2019   Lab Results  Component Value Date   INSULIN  26.1 (H) 11/14/2023   INSULIN  29.5 (H) 12/20/2019   Lab Results  Component Value Date   TSH 2.500 03/23/2023   CBC    Component Value Date/Time   WBC 5.6 11/14/2023 0749   RBC 4.65 11/14/2023 0749   HGB 15.3 11/14/2023 0749   HCT 45.1 11/14/2023  0749   PLT 188 11/14/2023 0749   MCV 97 11/14/2023 0749   MCH 32.9 11/14/2023 0749   MCHC 33.9 11/14/2023 0749   RDW 12.9 11/14/2023 0749   Iron Studies    Component Value Date/Time   IRON 122 03/23/2023 0843   TIBC 302 03/23/2023 0843   FERRITIN 196 03/23/2023 0843   IRONPCTSAT 40 03/23/2023 0843   Lipid Panel     Component Value Date/Time   CHOL 98 (L) 11/14/2023 0749   TRIG 69 11/14/2023 0749   HDL 47 11/14/2023 0749   LDLCALC 36 11/14/2023 0749   Hepatic Function Panel     Component Value Date/Time   PROT 7.1 11/14/2023 0749   ALBUMIN 4.7 11/14/2023 0749   AST 25  11/14/2023 0749   ALT 29 11/14/2023 0749   ALKPHOS 53 11/14/2023 0749   BILITOT 0.4 11/14/2023 0749      Component Value Date/Time   TSH 2.500 03/23/2023 0843   Nutritional Lab Results  Component Value Date   VD25OH 39.5 11/14/2023   VD25OH 45.2 03/23/2023   VD25OH 45.9 10/21/2022     Assessment and Plan Assessment & Plan Obesity Obesity management is ongoing with a focus on dietary changes and exercise. He is following the category three eating plan 85% of the time and has lost 4 pounds in the last month. His resting metabolic rate has decreased by about 100 calories from three years ago, which is lower than expected based on bioimpedance scale analysis. Protein intake is close to the goal of 1 gram per kilogram of body weight, averaging 104 grams per day. - Continue category three eating plan - Encourage consistent protein intake of at least 107 grams per day - Increase strengthening exercises focusing on core, buttocks, and thighs - Continue walking for exercise 3-5 days per week  Shortness of breath with exercise Shortness of breath with exercise is being addressed through increased exercise and weight loss. Indirect calorimetry shows a decrease in resting metabolic rate, which may contribute to symptoms. - Continue weight loss efforts - Increase exercise to improve symptoms  Prediabetes Prediabetes is worsening despite being on metformin  twice daily. A1c has increased to 6.0, indicating an increase in simple carbohydrate intake. Insulin  levels are also increasing. He is working on dietary changes and weight management, which are expected to improve his condition. - Continue metformin  twice daily - Monitor carbohydrate intake - Recheck labs in 3-4 months - Consider increasing metformin  to three times daily if no improvement by next lab check  Hypertension Hypertension is well controlled with current medication regimen. Blood pressure is 121/63. Kidney function is stable  with GFR above 60. - Continue hydrochlorothiazide 37.5/25 mg - Continue losartan  50 mg  Hyperlipidemia Hyperlipidemia is well controlled with current medication. LDL is 36, and HDL is 47. Liver function tests are normal. Vitamin D  levels have decreased from 45 to 39. - Continue Crestor  5 mg daily - Restart vitamin D  supplementation at 5000 IU daily - Recheck labs in 3-4 months  Depression with anxiety Depression with anxiety is being managed with Wellbutrin  SR. He has stopped Lexapro, which was associated with weight gain and mood stabilization. Increasing Wellbutrin  may help, but there is a risk of increased irritability. - Increase Wellbutrin  SR to 200 mg once daily - Monitor mood and irritability - Follow up in one month to assess response      Abayomi was informed of the importance of frequent follow up visits to maximize his success with intensive lifestyle  modifications for his obesity and obesity related health conditions as recommended by USPSTF and CMS guidelines   Louann Penton, MD

## 2024-01-10 ENCOUNTER — Encounter (INDEPENDENT_AMBULATORY_CARE_PROVIDER_SITE_OTHER): Payer: Self-pay

## 2024-01-10 DIAGNOSIS — G4733 Obstructive sleep apnea (adult) (pediatric): Secondary | ICD-10-CM | POA: Insufficient documentation

## 2024-01-23 ENCOUNTER — Encounter (INDEPENDENT_AMBULATORY_CARE_PROVIDER_SITE_OTHER): Payer: Self-pay | Admitting: Family Medicine

## 2024-01-23 ENCOUNTER — Ambulatory Visit (INDEPENDENT_AMBULATORY_CARE_PROVIDER_SITE_OTHER): Payer: Self-pay | Admitting: Family Medicine

## 2024-01-23 VITALS — BP 127/71 | HR 62 | Temp 97.6°F | Ht 69.0 in | Wt 236.0 lb

## 2024-01-23 DIAGNOSIS — I10 Essential (primary) hypertension: Secondary | ICD-10-CM | POA: Diagnosis not present

## 2024-01-23 DIAGNOSIS — F5089 Other specified eating disorder: Secondary | ICD-10-CM | POA: Diagnosis not present

## 2024-01-23 DIAGNOSIS — E669 Obesity, unspecified: Secondary | ICD-10-CM

## 2024-01-23 DIAGNOSIS — G4733 Obstructive sleep apnea (adult) (pediatric): Secondary | ICD-10-CM

## 2024-01-23 DIAGNOSIS — Z6834 Body mass index (BMI) 34.0-34.9, adult: Secondary | ICD-10-CM

## 2024-01-23 DIAGNOSIS — R7303 Prediabetes: Secondary | ICD-10-CM | POA: Diagnosis not present

## 2024-01-23 MED ORDER — METFORMIN HCL 500 MG PO TABS: 500.0000 mg | ORAL_TABLET | Freq: Two times a day (BID) | ORAL | 0 refills | Status: AC

## 2024-01-23 MED ORDER — LOSARTAN POTASSIUM 50 MG PO TABS: 50.0000 mg | ORAL_TABLET | Freq: Every day | ORAL | 0 refills | Status: DC

## 2024-01-23 MED ORDER — BUPROPION HCL ER (SR) 200 MG PO TB12: 200.0000 mg | ORAL_TABLET | Freq: Every day | ORAL | 0 refills | Status: AC

## 2024-01-23 NOTE — Progress Notes (Signed)
 Office: [phone]  /  Fax: [phone]  WEIGHT SUMMARY AND BIOMETRICS  Anthropometric Measurements Height: 5' 9 (1.753 m) Weight: 236 lb (107 kg) BMI (Calculated): 34.84 Weight at Last Visit: 236 lb Weight Lost Since Last Visit: 0 Weight Gained Since Last Visit: 0 Starting Weight: 248 lb Total Weight Loss (lbs): 12 lb (5.443 kg) Peak Weight: 258 lb   Body Composition  Body Fat %: 33.2 % Fat Mass (lbs): 78.6 lbs Muscle Mass (lbs): 150.4 lbs Total Body Water (lbs): 111.8 lbs Visceral Fat Rating : 22   Other Clinical Data RMR: 1800 Fasting: yes Labs: no Today's Visit #: 60 Starting Date: 12/20/19    Chief Complaint: OBESITY   History of Present Illness Stephen Oconnell is a 78 year old male who presents for obesity treatment and progress assessment.  He is adhering to a category three eating plan 75% of the time, focusing on increasing fruits and vegetables but struggling with protein intake. He maintains hydration and avoids skipping meals. He exercises by walking outdoors for almost an hour, three to four days per week. He has maintained his weight over the last month.  He is being treated for hypertension with losartan  50 mg daily and triamterene/hydrochlorothiazide 37.5/25 mg daily.  He has prediabetes and is working on improving his diet by decreasing simple carbohydrates. His last labs in September showed an A1c of 6.0. He is on metformin  500 mg twice a day.  He experiences emotional eating behaviors and is on Wellbutrin  SR 200 mg daily which is stable. He needs a refill of his Wellbutrin . He is no longer on lexapro.  He reports spells of falling asleep, often in church or while sitting in his easy chair, despite getting seven to nine hours of sleep per night. He wakes up feeling draggy and experiences morning headaches. He has a history of sleep apnea and uses a CPAP machine regularly, with satisfactory compliance and no issues with mask fit or leaks. His last  thyroid  check in January showed a TSH of 2.5, which was normal at that time. No lightheadedness or dizziness.      PHYSICAL EXAM:  Blood pressure 127/71, pulse 62, temperature 97.6 F (36.4 C), height 5' 9 (1.753 m), weight 236 lb (107 kg), SpO2 97%. Body mass index is 34.85 kg/m.  DIAGNOSTIC DATA REVIEWED:  BMET    Component Value Date/Time   NA 139 11/14/2023 0749   K 4.2 11/14/2023 0749   CL 100 11/14/2023 0749   CO2 23 11/14/2023 0749   GLUCOSE 115 (H) 11/14/2023 0749   BUN 23 11/14/2023 0749   CREATININE 1.12 11/14/2023 0749   CALCIUM  9.2 11/14/2023 0749   GFRNONAA 57 (L) 04/08/2020 0801   GFRAA 65 04/08/2020 0801   Lab Results  Component Value Date   HGBA1C 6.0 (H) 11/14/2023   HGBA1C 6.0 (H) 12/20/2019   Lab Results  Component Value Date   INSULIN  26.1 (H) 11/14/2023   INSULIN  29.5 (H) 12/20/2019   Lab Results  Component Value Date   TSH 2.500 03/23/2023   CBC    Component Value Date/Time   WBC 5.6 11/14/2023 0749   RBC 4.65 11/14/2023 0749   HGB 15.3 11/14/2023 0749   HCT 45.1 11/14/2023 0749   PLT 188 11/14/2023 0749   MCV 97 11/14/2023 0749   MCH 32.9 11/14/2023 0749   MCHC 33.9 11/14/2023 0749   RDW 12.9 11/14/2023 0749   Iron Studies    Component Value Date/Time   IRON 122 03/23/2023  0843   TIBC 302 03/23/2023 0843   FERRITIN 196 03/23/2023 0843   IRONPCTSAT 40 03/23/2023 0843   Lipid Panel     Component Value Date/Time   CHOL 98 (L) 11/14/2023 0749   TRIG 69 11/14/2023 0749   HDL 47 11/14/2023 0749   LDLCALC 36 11/14/2023 0749   Hepatic Function Panel     Component Value Date/Time   PROT 7.1 11/14/2023 0749   ALBUMIN 4.7 11/14/2023 0749   AST 25 11/14/2023 0749   ALT 29 11/14/2023 0749   ALKPHOS 53 11/14/2023 0749   BILITOT 0.4 11/14/2023 0749      Component Value Date/Time   TSH 2.500 03/23/2023 0843   Nutritional Lab Results  Component Value Date   VD25OH 39.5 11/14/2023   VD25OH 45.2 03/23/2023   VD25OH 45.9  10/21/2022     Assessment and Plan Assessment & Plan 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 He is working on increasing fruit and vegetable intake but struggles with protein consumption. He is hydrating well and maintaining regular meal patterns. Exercise includes walking for almost an hour, three to four days per week. Weight has been stable over the last month. Emotional eating behaviors are present, and he is on Wellbutrin  SR and Lexapro for management. Discussed the 'don't let it touch' strategy for portion control during Thanksgiving to reduce carbohydrate intake. - Continue category three eating plan - Encouraged increased protein intake - Continue walking exercise regimen - Refilled Wellbutrin  SR - Implement 'don't let it touch' strategy during Thanksgiving  Essential hypertension Hypertension is well-controlled with losartan  50 mg daily and triamterene hydrochlorothiazide 37.5/25 mg daily. Blood pressure today is 127/71 mmHg. No symptoms of lightheadedness or dizziness reported. - Refilled losartan  - Continue diet, exercise and weight loss as discussed today as an important part of the treatment plan   Prediabetes Managed with dietary modifications to reduce simple carbohydrates. Last A1c was 6.0% in September. He is on metformin  500 mg twice daily. Discussed potential future coverage for weight loss medications if new indications are approved. - Continue metformin  500 mg twice daily - Refilled metformin  - Continue dietary modifications to reduce simple carbohydrates  Obstructive sleep apnea Managed with CPAP therapy. He reports adequate sleep duration but not quality sleep, experiencing morning headaches and daytime somnolence. CPAP settings and mask fit are satisfactory. Discussed potential referral to a new sleep specialist after the current provider retires.  Consideration of rechecking thyroid  function and CBC if symptoms persist. - Continue CPAP therapy - Will consider referral to new sleep specialist if new sleep doctor's start is delayed - Will recheck thyroid  function and CBC if symptoms persist    Stephen Oconnell was counseled on the importance of maintaining healthy lifestyle habits, including balanced nutrition, regular physical activity, and behavioral modifications, while taking antiobesity medication.  Patient verbalized understanding that medication is an adjunct to, not a replacement for, lifestyle changes and that the long-term success and weight maintenance depend on continued adherence to these strategies.   Cashis was informed of the importance of frequent follow up visits to maximize his success with intensive lifestyle modifications for his obesity and obesity related health conditions as recommended by USPSTF and CMS guidelines   Louann Penton, MD

## 2024-03-06 ENCOUNTER — Ambulatory Visit (INDEPENDENT_AMBULATORY_CARE_PROVIDER_SITE_OTHER): Payer: Self-pay | Admitting: Family Medicine

## 2024-03-06 ENCOUNTER — Encounter (INDEPENDENT_AMBULATORY_CARE_PROVIDER_SITE_OTHER): Payer: Self-pay | Admitting: Family Medicine

## 2024-03-06 VITALS — BP 143/88 | HR 74 | Temp 97.7°F | Ht 69.0 in | Wt 234.0 lb

## 2024-03-06 DIAGNOSIS — G4733 Obstructive sleep apnea (adult) (pediatric): Secondary | ICD-10-CM

## 2024-03-06 DIAGNOSIS — Z6834 Body mass index (BMI) 34.0-34.9, adult: Secondary | ICD-10-CM

## 2024-03-06 DIAGNOSIS — E669 Obesity, unspecified: Secondary | ICD-10-CM | POA: Diagnosis not present

## 2024-03-06 DIAGNOSIS — F418 Other specified anxiety disorders: Secondary | ICD-10-CM

## 2024-03-06 DIAGNOSIS — I10 Essential (primary) hypertension: Secondary | ICD-10-CM | POA: Diagnosis not present

## 2024-03-06 MED ORDER — ESCITALOPRAM OXALATE 10 MG PO TABS: 10.0000 mg | ORAL_TABLET | Freq: Every day | ORAL | 0 refills | Status: AC

## 2024-03-06 MED ORDER — ZEPBOUND 2.5 MG/0.5ML ~~LOC~~ SOAJ: 2.5000 mg | SUBCUTANEOUS | 0 refills | Status: DC

## 2024-03-06 NOTE — Progress Notes (Signed)
 "  Office: [phone]  /  Fax: [phone]  WEIGHT SUMMARY AND BIOMETRICS  Anthropometric Measurements Height: 5' 9 (1.753 m) Weight: 234 lb (106.1 kg) BMI (Calculated): 34.54 Weight at Last Visit: 236lb Weight Lost Since Last Visit: 2lb Weight Gained Since Last Visit: 0lb Starting Weight: 248lb Total Weight Loss (lbs): 14 lb (6.35 kg) Peak Weight: 258lb   Body Composition  Body Fat %: 32.6 % Fat Mass (lbs): 76.4 lbs Muscle Mass (lbs): 150.4 lbs Total Body Water (lbs): 108.8 lbs Visceral Fat Rating : 22   Other Clinical Data RMR: 1800 Fasting: Yes Labs: no Today's Visit #: 48 Starting Date: 12/20/19    Chief Complaint: OBESITY   Discussed the use of AI scribe software for clinical note transcription with the patient, who gave verbal consent to proceed.  History of Present Illness Stephen Oconnell is a 78 year old male who presents for obesity treatment and progress assessment.  He has been following a category three eating plan about sixty percent of the time, focusing on increasing fruit and vegetable intake, but struggles to meet protein goals. He exercises three days a week, walking for forty-five minutes each session, and has lost two pounds over the last six weeks, including during the Thanksgiving and Christmas holidays.  His blood pressure has been fluctuating. He is currently taking losartan  50 mg daily and triamterene/hydrochlorothiazide 37.5/25 mg.  He attempted to taper off Lexapro over two weeks but experienced extreme irritability, leading him to resume the medication. He is currently on Lexapro 10 mg daily.  He has sleep apnea and uses a CPAP machine regularly. He mentioned that his A1c was high or close to high during the last check, and he is working to prevent diabetes.  He takes two teaspoons of Metamucil every morning to manage bowel regularity.      PHYSICAL EXAM:  Blood pressure (!) 143/88, pulse 74, temperature 97.7 F (36.5 C), height 5'  9 (1.753 m), weight 234 lb (106.1 kg), SpO2 99%. Body mass index is 34.56 kg/m.  DIAGNOSTIC DATA REVIEWED:  BMET    Component Value Date/Time   NA 139 11/14/2023 0749   K 4.2 11/14/2023 0749   CL 100 11/14/2023 0749   CO2 23 11/14/2023 0749   GLUCOSE 115 (H) 11/14/2023 0749   BUN 23 11/14/2023 0749   CREATININE 1.12 11/14/2023 0749   CALCIUM  9.2 11/14/2023 0749   GFRNONAA 57 (L) 04/08/2020 0801   GFRAA 65 04/08/2020 0801   Lab Results  Component Value Date   HGBA1C 6.0 (H) 11/14/2023   HGBA1C 6.0 (H) 12/20/2019   Lab Results  Component Value Date   INSULIN  26.1 (H) 11/14/2023   INSULIN  29.5 (H) 12/20/2019   Lab Results  Component Value Date   TSH 2.500 03/23/2023   CBC    Component Value Date/Time   WBC 5.6 11/14/2023 0749   RBC 4.65 11/14/2023 0749   HGB 15.3 11/14/2023 0749   HCT 45.1 11/14/2023 0749   PLT 188 11/14/2023 0749   MCV 97 11/14/2023 0749   MCH 32.9 11/14/2023 0749   MCHC 33.9 11/14/2023 0749   RDW 12.9 11/14/2023 0749   Iron Studies    Component Value Date/Time   IRON 122 03/23/2023 0843   TIBC 302 03/23/2023 0843   FERRITIN 196 03/23/2023 0843   IRONPCTSAT 40 03/23/2023 0843   Lipid Panel     Component Value Date/Time   CHOL 98 (L) 11/14/2023 0749   TRIG 69 11/14/2023 0749   HDL 47  11/14/2023 0749   LDLCALC 36 11/14/2023 0749   Hepatic Function Panel     Component Value Date/Time   PROT 7.1 11/14/2023 0749   ALBUMIN 4.7 11/14/2023 0749   AST 25 11/14/2023 0749   ALT 29 11/14/2023 0749   ALKPHOS 53 11/14/2023 0749   BILITOT 0.4 11/14/2023 0749      Component Value Date/Time   TSH 2.500 03/23/2023 0843   Nutritional Lab Results  Component Value Date   VD25OH 39.5 11/14/2023   VD25OH 45.2 03/23/2023   VD25OH 45.9 10/21/2022     Assessment and Plan Assessment & Plan Obesity Management is ongoing with a focus on dietary changes and exercise. He has lost 2 pounds over the last six weeks, including during Thanksgiving  and Christmas, which is commendable. He follows a category three eating plan about 60% of the time, struggles with meeting protein goals, and exercises three days a week, walking 45 minutes each session. Discussed the potential use of Zepbound for weight management, considering insurance approval and deductible requirements. Emphasized the importance of protein intake to prevent muscle mass loss, especially in patients over 40. Discussed the potential side effects of Zepbound, including feeling full faster and staying full longer, which may lead to constipation and nausea if not managed properly. - Submitted prescription for Zepbound and attempted insurance approval. - Provided a list of protein-rich food options to help meet daily protein goals. - Encouraged continuation of current exercise regimen and discussed adding strengthening exercises in the future. - Advised on managing potential side effects of Zepbound, including constipation and nausea.  Obstructive sleep apnea Managed with CPAP therapy, which he uses regularly. Discussed the potential use of Zepbound, which may be approved under the diagnosis of sleep apnea. - Continue CPAP therapy. - Submitted Zepbound prescription under sleep apnea diagnosis for insurance approval.  Hypertension Currently managed with losartan  50 mg daily and triamterene/hydrochlorothiazide 37.5/25 mg. Blood pressure was elevated at 143/88 today, possibly due to recent activity, but has been controlled in the last two visits. No home monitoring is currently being done. - Encouraged home blood pressure monitoring once or twice a week to track trends.  Depression with anxiety Managed with Lexapro 10 mg. He attempted to taper off but experienced withdrawal symptoms, including extreme irritability, and has resumed the medication. Discussed the importance of maintaining a stable mood with Lexapro, especially given the challenges of tapering off. - Renewed Lexapro  prescription for 90 days.      Patients who are on anti-obesity medications are counseled on the importance of maintaining healthy lifestyle habits, including balanced nutrition, regular physical activity, and behavioral modifications,  Medication is an adjunct to, not a replacement for, lifestyle changes and that the long-term success and weight maintenance depend on continued adherence to these strategies.   Stephen Oconnell was informed of the importance of frequent follow up visits to maximize his success with intensive lifestyle modifications for his obesity and obesity related health conditions as recommended by USPSTF and CMS guidelines Louann Penton, MD   "

## 2024-03-15 ENCOUNTER — Encounter: Payer: Self-pay | Admitting: Physician Assistant

## 2024-03-19 ENCOUNTER — Encounter (INDEPENDENT_AMBULATORY_CARE_PROVIDER_SITE_OTHER): Payer: Self-pay | Admitting: Family Medicine

## 2024-03-20 NOTE — Telephone Encounter (Signed)
 Call to [facility]:  Ask for a copy of the sleep study.  Spoke with Alisa.  The only one they have is from 2014.  Now waiting for the arrival of the sleep study via fax.

## 2024-04-05 ENCOUNTER — Encounter (INDEPENDENT_AMBULATORY_CARE_PROVIDER_SITE_OTHER): Payer: Self-pay | Admitting: Family Medicine

## 2024-04-05 ENCOUNTER — Ambulatory Visit (INDEPENDENT_AMBULATORY_CARE_PROVIDER_SITE_OTHER): Admitting: Family Medicine

## 2024-04-05 VITALS — BP 138/69 | HR 72 | Temp 97.7°F | Ht 69.0 in | Wt 235.0 lb

## 2024-04-05 DIAGNOSIS — R7303 Prediabetes: Secondary | ICD-10-CM

## 2024-04-05 DIAGNOSIS — Z6834 Body mass index (BMI) 34.0-34.9, adult: Secondary | ICD-10-CM

## 2024-04-05 DIAGNOSIS — E669 Obesity, unspecified: Secondary | ICD-10-CM | POA: Diagnosis not present

## 2024-04-05 DIAGNOSIS — G4733 Obstructive sleep apnea (adult) (pediatric): Secondary | ICD-10-CM

## 2024-04-05 DIAGNOSIS — I10 Essential (primary) hypertension: Secondary | ICD-10-CM

## 2024-04-05 MED ORDER — ZEPBOUND 2.5 MG/0.5ML ~~LOC~~ SOAJ: 2.5000 mg | SUBCUTANEOUS | 0 refills | Status: AC

## 2024-04-05 NOTE — Addendum Note (Signed)
 Addended by: LAFE BAKER CROME on: 04/05/2024 10:08 AM   Modules accepted: Level of Service

## 2024-04-05 NOTE — Progress Notes (Signed)
 "  Office: [phone]  /  Fax: [phone]  WEIGHT SUMMARY AND BIOMETRICS  Anthropometric Measurements Height: 5' 9 (1.753 m) Weight: 235 lb (106.6 kg) BMI (Calculated): 34.69 Weight at Last Visit: 234lb Weight Lost Since Last Visit: 0lb Weight Gained Since Last Visit: 1lb Starting Weight: 248lb Total Weight Loss (lbs): 13 lb (5.897 kg) Peak Weight: 258lb   Body Composition  Body Fat %: 32.8 % Fat Mass (lbs): 77.2 lbs Muscle Mass (lbs): 150.6 lbs Total Body Water (lbs): 107.8 lbs Visceral Fat Rating : 22   Other Clinical Data RMR: 1800 Fasting: Yes Labs: Yes Today's Visit #: 69 Starting Date: 12/20/19    Chief Complaint: OBESITY   History of Present Illness Stephen Oconnell is a 79 year old male with obesity and obstructive sleep apnea who presents for obesity treatment and progress assessment.  He has been adhering to the prescribed category three obesity treatment plan approximately sixty percent of the time. His efforts include consuming more whole foods, meeting the recommended protein intake, maintaining adequate hydration, and avoiding skipping meals. He engages in physical activity by walking three days a week for forty-five minutes. Despite these efforts, he has gained one pound in the last month, leading to frustration over his lack of progress and difficulty in adhering more closely to the plan.  He has a history of obstructive sleep apnea and consistently uses a CPAP machine every night, including during travel. He reports doing well with the CPAP and attends annual check-ups with positive feedback. His initial sleep study in 2007 indicated an AHI of 11.7, with an AHI of 18.9 in certain positions. There have been challenges in obtaining the original sleep study documentation required for insurance coverage of Zepbound , a medication intended to aid with both sleep apnea and weight loss.  His hunger levels have generally decreased, although there are days when he  snacks and other days when he does not feel the need to eat as much. He attempts to adhere to the recommended food types but struggles with the habit of weighing everything, opting to estimate portions instead. He is working on incorporating high-protein foods into his diet.      PHYSICAL EXAM:  Blood pressure 138/69, pulse 72, temperature 97.7 F (36.5 C), height 5' 9 (1.753 m), weight 235 lb (106.6 kg), SpO2 99%. Body mass index is 34.7 kg/m.  DIAGNOSTIC DATA REVIEWED BY MYSELF TODAY:  BMET    Component Value Date/Time   NA 139 11/14/2023 0749   K 4.2 11/14/2023 0749   CL 100 11/14/2023 0749   CO2 23 11/14/2023 0749   GLUCOSE 115 (H) 11/14/2023 0749   BUN 23 11/14/2023 0749   CREATININE 1.12 11/14/2023 0749   CALCIUM  9.2 11/14/2023 0749   GFRNONAA 57 (L) 04/08/2020 0801   GFRAA 65 04/08/2020 0801   Lab Results  Component Value Date   HGBA1C 6.0 (H) 11/14/2023   HGBA1C 6.0 (H) 12/20/2019   Lab Results  Component Value Date   INSULIN  26.1 (H) 11/14/2023   INSULIN  29.5 (H) 12/20/2019   Lab Results  Component Value Date   TSH 2.500 03/23/2023   CBC    Component Value Date/Time   WBC 5.6 11/14/2023 0749   RBC 4.65 11/14/2023 0749   HGB 15.3 11/14/2023 0749   HCT 45.1 11/14/2023 0749   PLT 188 11/14/2023 0749   MCV 97 11/14/2023 0749   MCH 32.9 11/14/2023 0749   MCHC 33.9 11/14/2023 0749   RDW 12.9 11/14/2023 0749   Iron  Studies    Component Value Date/Time   IRON 122 03/23/2023 0843   TIBC 302 03/23/2023 0843   FERRITIN 196 03/23/2023 0843   IRONPCTSAT 40 03/23/2023 0843   Lipid Panel     Component Value Date/Time   CHOL 98 (L) 11/14/2023 0749   TRIG 69 11/14/2023 0749   HDL 47 11/14/2023 0749   LDLCALC 36 11/14/2023 0749   Hepatic Function Panel     Component Value Date/Time   PROT 7.1 11/14/2023 0749   ALBUMIN 4.7 11/14/2023 0749   AST 25 11/14/2023 0749   ALT 29 11/14/2023 0749   ALKPHOS 53 11/14/2023 0749   BILITOT 0.4 11/14/2023 0749       Component Value Date/Time   TSH 2.500 03/23/2023 0843   Nutritional Lab Results  Component Value Date   VD25OH 39.5 11/14/2023   VD25OH 45.2 03/23/2023   VD25OH 45.9 10/21/2022     Assessment and Plan Assessment & Plan Obesity Management is ongoing with a focus on dietary changes and exercise. He has been following the category three plan approximately 60% of the time, working on increasing whole foods, protein intake, and hydration. He is walking for exercise three days a week for 45 minutes. Despite these efforts, he has gained one pound in the last month, leading to frustration. Zepbound  was considered for weight loss, but insurance approval is pending due to the need for an AHI of 15 or greater. Stephen Oconnell is an alternative if Zepbound  is not approved. Discussed the mechanism of GLP-1 drugs, including potential side effects such as nausea and the importance of timing with meals. He prefers Zepbound  if approved, but is open to Stephen Wegovy if necessary. - Submitted prior authorization for Zepbound . - If Zepbound  is not approved, will prescribe Stephen Wegovy. - Encouraged adherence to dietary plan and exercise regimen. - Provided meal prepping recipes with nutritional information.  Obstructive sleep apnea Managed with CPAP, which he uses nightly. He reports good compliance and satisfaction with the full face mask. Insurance approval for Zepbound  is being pursued to aid in weight loss and potentially improve sleep apnea. Previous sleep studies show an AHI of 11.7, with a positional AHI of 18.9 on the left side, which may meet insurance criteria for coverage. - Continue CPAP therapy. - Submitted prior authorization for Zepbound  to aid in weight loss and potentially improve sleep apnea.      Patients who are on anti-obesity medications are counseled on the importance of maintaining healthy lifestyle habits, including balanced nutrition, regular physical activity, and behavioral  modifications,  Medication is an adjunct to, not a replacement for, lifestyle changes and that the long-term success and weight maintenance depend on continued adherence to these strategies.   Stephen Oconnell was informed of the importance of frequent follow up visits to maximize his success with intensive lifestyle modifications for his obesity and obesity related health conditions as recommended by USPSTF and CMS guidelines  Louann Penton, MD   "

## 2024-04-06 NOTE — Telephone Encounter (Signed)
 Prior authorization has been submitted for Zepbound  for OSA.  Awaiting determination.

## 2024-04-11 ENCOUNTER — Other Ambulatory Visit (INDEPENDENT_AMBULATORY_CARE_PROVIDER_SITE_OTHER): Payer: Self-pay | Admitting: Family Medicine

## 2024-04-11 DIAGNOSIS — I10 Essential (primary) hypertension: Secondary | ICD-10-CM

## 2024-05-03 ENCOUNTER — Ambulatory Visit (INDEPENDENT_AMBULATORY_CARE_PROVIDER_SITE_OTHER): Admitting: Family Medicine

## 2024-05-14 ENCOUNTER — Ambulatory Visit (INDEPENDENT_AMBULATORY_CARE_PROVIDER_SITE_OTHER): Admitting: Family Medicine

## 2024-06-11 ENCOUNTER — Ambulatory Visit (INDEPENDENT_AMBULATORY_CARE_PROVIDER_SITE_OTHER): Admitting: Family Medicine
